# Patient Record
Sex: Female | Born: 1965
Health system: Southern US, Community
[De-identification: ages and names within clinical notes are randomized; demographics above are authoritative.]

## PROBLEM LIST (undated history)

## (undated) ENCOUNTER — Ambulatory Visit: Admission: EM | Source: Home / Self Care

## (undated) DIAGNOSIS — F32A Depression, unspecified: Secondary | ICD-10-CM

## (undated) DIAGNOSIS — G40802 Other epilepsy, not intractable, without status epilepticus: Secondary | ICD-10-CM

## (undated) DIAGNOSIS — K645 Perianal venous thrombosis: Secondary | ICD-10-CM

## (undated) DIAGNOSIS — D689 Coagulation defect, unspecified: Secondary | ICD-10-CM

## (undated) DIAGNOSIS — G40909 Epilepsy, unspecified, not intractable, without status epilepticus: Secondary | ICD-10-CM

## (undated) DIAGNOSIS — R569 Unspecified convulsions: Secondary | ICD-10-CM

## (undated) DIAGNOSIS — Z87440 Personal history of urinary (tract) infections: Secondary | ICD-10-CM

## (undated) DIAGNOSIS — R0600 Dyspnea, unspecified: Secondary | ICD-10-CM

## (undated) DIAGNOSIS — A630 Anogenital (venereal) warts: Secondary | ICD-10-CM

## (undated) DIAGNOSIS — G43909 Migraine, unspecified, not intractable, without status migrainosus: Secondary | ICD-10-CM

## (undated) DIAGNOSIS — D649 Anemia, unspecified: Secondary | ICD-10-CM

## (undated) DIAGNOSIS — F329 Major depressive disorder, single episode, unspecified: Secondary | ICD-10-CM

## (undated) DIAGNOSIS — K529 Noninfective gastroenteritis and colitis, unspecified: Secondary | ICD-10-CM

## (undated) DIAGNOSIS — K635 Polyp of colon: Secondary | ICD-10-CM

## (undated) DIAGNOSIS — T7840XA Allergy, unspecified, initial encounter: Secondary | ICD-10-CM

## (undated) DIAGNOSIS — R0609 Other forms of dyspnea: Secondary | ICD-10-CM

## (undated) HISTORY — PX: COSMETIC SURGERY: SHX468

## (undated) HISTORY — DX: Anogenital (venereal) warts: A63.0

## (undated) HISTORY — DX: Migraine, unspecified, not intractable, without status migrainosus: G43.909

## (undated) HISTORY — DX: Depression, unspecified: F32.A

## (undated) HISTORY — DX: Polyp of colon: K63.5

## (undated) HISTORY — DX: Perianal venous thrombosis: K64.5

## (undated) HISTORY — PX: UPPER GASTROINTESTINAL ENDOSCOPY: SHX188

## (undated) HISTORY — DX: Noninfective gastroenteritis and colitis, unspecified: K52.9

## (undated) HISTORY — DX: Other forms of dyspnea: R06.09

## (undated) HISTORY — DX: Coagulation defect, unspecified: D68.9

## (undated) HISTORY — DX: Major depressive disorder, single episode, unspecified: F32.9

## (undated) HISTORY — PX: HEMORRHOID SURGERY: SHX153

## (undated) HISTORY — DX: Epilepsy, unspecified, not intractable, without status epilepticus: G40.909

## (undated) HISTORY — DX: Unspecified convulsions: R56.9

## (undated) HISTORY — PX: ABDOMINAL HYSTERECTOMY: SHX81

## (undated) HISTORY — DX: Personal history of urinary (tract) infections: Z87.440

## (undated) HISTORY — DX: Allergy, unspecified, initial encounter: T78.40XA

## (undated) HISTORY — DX: Anemia, unspecified: D64.9

## (undated) HISTORY — DX: Other epilepsy, not intractable, without status epilepticus: G40.802

## (undated) HISTORY — DX: Dyspnea, unspecified: R06.00

---
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
## 1998-07-18 ENCOUNTER — Other Ambulatory Visit: Admission: RE | Admit: 1998-07-18 | Discharge: 1998-07-18 | Payer: Self-pay | Admitting: Gynecology

## 2000-04-09 ENCOUNTER — Other Ambulatory Visit: Admission: RE | Admit: 2000-04-09 | Discharge: 2000-04-09 | Payer: Self-pay | Admitting: Obstetrics and Gynecology

## 2000-04-12 ENCOUNTER — Ambulatory Visit (HOSPITAL_COMMUNITY): Admission: RE | Admit: 2000-04-12 | Discharge: 2000-04-12 | Payer: Self-pay | Admitting: *Deleted

## 2000-04-12 ENCOUNTER — Encounter: Payer: Self-pay | Admitting: *Deleted

## 2000-05-16 ENCOUNTER — Ambulatory Visit (HOSPITAL_COMMUNITY): Admission: RE | Admit: 2000-05-16 | Discharge: 2000-05-16 | Payer: Self-pay | Admitting: Obstetrics and Gynecology

## 2000-05-16 ENCOUNTER — Encounter: Payer: Self-pay | Admitting: Obstetrics and Gynecology

## 2000-06-07 ENCOUNTER — Ambulatory Visit (HOSPITAL_COMMUNITY): Admission: RE | Admit: 2000-06-07 | Discharge: 2000-06-07 | Payer: Self-pay | Admitting: Obstetrics and Gynecology

## 2000-08-25 ENCOUNTER — Inpatient Hospital Stay (HOSPITAL_COMMUNITY): Admission: AD | Admit: 2000-08-25 | Discharge: 2000-08-28 | Payer: Self-pay | Admitting: *Deleted

## 2000-08-29 ENCOUNTER — Encounter: Admission: RE | Admit: 2000-08-29 | Discharge: 2000-09-17 | Payer: Self-pay | Admitting: Obstetrics and Gynecology

## 2000-09-19 ENCOUNTER — Encounter: Admission: RE | Admit: 2000-09-19 | Discharge: 2000-12-18 | Payer: Self-pay | Admitting: Obstetrics and Gynecology

## 2001-05-13 ENCOUNTER — Other Ambulatory Visit: Admission: RE | Admit: 2001-05-13 | Discharge: 2001-05-13 | Payer: Self-pay | Admitting: Obstetrics and Gynecology

## 2001-06-11 ENCOUNTER — Encounter (INDEPENDENT_AMBULATORY_CARE_PROVIDER_SITE_OTHER): Payer: Self-pay

## 2001-06-11 ENCOUNTER — Other Ambulatory Visit: Admission: RE | Admit: 2001-06-11 | Discharge: 2001-06-11 | Payer: Self-pay | Admitting: Obstetrics and Gynecology

## 2001-10-03 ENCOUNTER — Other Ambulatory Visit: Admission: RE | Admit: 2001-10-03 | Discharge: 2001-10-03 | Payer: Self-pay | Admitting: Obstetrics and Gynecology

## 2001-12-03 ENCOUNTER — Encounter: Payer: Self-pay | Admitting: Family Medicine

## 2001-12-03 ENCOUNTER — Encounter: Admission: RE | Admit: 2001-12-03 | Discharge: 2001-12-03 | Payer: Self-pay | Admitting: Family Medicine

## 2002-03-04 ENCOUNTER — Other Ambulatory Visit: Admission: RE | Admit: 2002-03-04 | Discharge: 2002-03-04 | Payer: Self-pay | Admitting: Obstetrics and Gynecology

## 2002-06-11 ENCOUNTER — Other Ambulatory Visit: Admission: RE | Admit: 2002-06-11 | Discharge: 2002-06-11 | Payer: Self-pay | Admitting: Obstetrics and Gynecology

## 2002-11-05 ENCOUNTER — Other Ambulatory Visit: Admission: RE | Admit: 2002-11-05 | Discharge: 2002-11-05 | Payer: Self-pay | Admitting: Obstetrics and Gynecology

## 2002-12-16 ENCOUNTER — Ambulatory Visit (HOSPITAL_COMMUNITY): Admission: RE | Admit: 2002-12-16 | Discharge: 2002-12-16 | Payer: Self-pay | Admitting: Obstetrics and Gynecology

## 2002-12-16 ENCOUNTER — Encounter: Payer: Self-pay | Admitting: Obstetrics and Gynecology

## 2003-01-07 ENCOUNTER — Encounter: Payer: Self-pay | Admitting: Obstetrics and Gynecology

## 2003-01-07 ENCOUNTER — Ambulatory Visit (HOSPITAL_COMMUNITY): Admission: RE | Admit: 2003-01-07 | Discharge: 2003-01-07 | Payer: Self-pay | Admitting: Obstetrics and Gynecology

## 2003-04-27 ENCOUNTER — Encounter: Payer: Self-pay | Admitting: Obstetrics and Gynecology

## 2003-04-27 ENCOUNTER — Ambulatory Visit (HOSPITAL_COMMUNITY): Admission: RE | Admit: 2003-04-27 | Discharge: 2003-04-27 | Payer: Self-pay | Admitting: Obstetrics and Gynecology

## 2003-05-14 ENCOUNTER — Inpatient Hospital Stay (HOSPITAL_COMMUNITY): Admission: AD | Admit: 2003-05-14 | Discharge: 2003-05-16 | Payer: Self-pay | Admitting: Obstetrics and Gynecology

## 2004-02-10 ENCOUNTER — Other Ambulatory Visit: Admission: RE | Admit: 2004-02-10 | Discharge: 2004-02-10 | Payer: Self-pay | Admitting: Obstetrics and Gynecology

## 2005-03-30 ENCOUNTER — Other Ambulatory Visit: Admission: RE | Admit: 2005-03-30 | Discharge: 2005-03-30 | Payer: Self-pay | Admitting: Obstetrics and Gynecology

## 2005-10-04 ENCOUNTER — Inpatient Hospital Stay (HOSPITAL_COMMUNITY): Admission: AD | Admit: 2005-10-04 | Discharge: 2005-10-06 | Payer: Self-pay | Admitting: Obstetrics and Gynecology

## 2006-05-02 ENCOUNTER — Other Ambulatory Visit: Admission: RE | Admit: 2006-05-02 | Discharge: 2006-05-02 | Payer: Self-pay | Admitting: Obstetrics and Gynecology

## 2006-12-12 ENCOUNTER — Encounter: Admission: RE | Admit: 2006-12-12 | Discharge: 2006-12-12 | Payer: Self-pay | Admitting: Obstetrics and Gynecology

## 2008-04-26 ENCOUNTER — Encounter: Admission: RE | Admit: 2008-04-26 | Discharge: 2008-04-26 | Payer: Self-pay | Admitting: Obstetrics and Gynecology

## 2009-06-03 ENCOUNTER — Encounter: Admission: RE | Admit: 2009-06-03 | Discharge: 2009-06-03 | Payer: Self-pay | Admitting: Obstetrics and Gynecology

## 2009-12-24 HISTORY — PX: OTHER SURGICAL HISTORY: SHX169

## 2010-07-18 ENCOUNTER — Encounter: Admission: RE | Admit: 2010-07-18 | Discharge: 2010-07-18 | Payer: Self-pay | Admitting: Obstetrics and Gynecology

## 2010-10-29 ENCOUNTER — Emergency Department (HOSPITAL_BASED_OUTPATIENT_CLINIC_OR_DEPARTMENT_OTHER)
Admission: EM | Admit: 2010-10-29 | Discharge: 2010-10-29 | Payer: Self-pay | Source: Home / Self Care | Admitting: Emergency Medicine

## 2011-05-11 NOTE — Discharge Summary (Signed)
Cathy [name], THIEMANN                 ACCOUNT NO.:  [id number]   MEDICAL RECORD NO.:  [id number]          PATIENT TYPE:  INP   LOCATION:  9135                          FACILITY:  [facility]   PHYSICIAN:  Huel Cote, M.D. DATE OF BIRTH:  07/11/1966   DATE OF ADMISSION:  10/04/2005  DATE OF DISCHARGE:  10/06/2005                                 DISCHARGE SUMMARY   DISCHARGE DIAGNOSES:  1.  Term pregnancy at 40 weeks delivered.  2.  Status post normal spontaneous vaginal delivery.   DISCHARGE MEDICATIONS:  1.  Percocet one to two tablets p.o. every 4 hours p.r.n.  2.  Motrin 600 mg p.o. every 6 hours.   DISCHARGE FOLLOWUP:  The patient is to follow up in 6 weeks for her routine  postpartum exam.   HOSPITAL COURSE:  The patient is a 45 year old G4, P2-0-1-2 who was admitted  at [redacted] weeks gestation with complaint of regular contractions and bloody  show. She had not broken her bag of water. On evaluation she was 5 cm on  admission with regular contractions. She shortly ruptured membranes after  admission and received epidural. Prenatal labs were as follows: O positive,  RPR nonreactive, rubella immune, hepatitis B surface antigen negative, HIV  negative, GC and chlamydia negative, triple screen normal, 1-hour glucola  139 and group B strep negative. Prenatal care was complicated by advanced  maternal age with a normal ultrasound.   PAST OBSTETRICAL HISTORY:  She has had two term vaginal deliveries, one  weighing 7 pounds 12 ounces, one weighing 5 pounds 4 ounces. She had one  elective abortion.   PAST MEDICAL HISTORY:  History of reflux disease, remote history of  seizures, depression.   PAST SURGICAL HISTORY:  Tonsillectomy.   ALLERGIES:  NITROFURANTOIN.   PHYSICAL EXAMINATION:  On physical exam on admission she was afebrile with  stable vital signs, fetal heart rate was reassuring, estimated fetal weight  was 7-1/2 pounds. Cervix shortly after receiving epidural was complete,  complete and +2 station. She pushed well, had a normal spontaneous vaginal  delivery of a vigorous female infant, Apgars were 9 and 9, weight was 8 pounds  10 ounces, perineum was intact, estimated blood loss was less than 500  cc and placenta delivered spontaneously. The patient was then admitted for  routine postpartum care and did very well. Postpartum day #2, she was  afebrile with stable vital signs and was felt stable for discharge home.  Therefore, she was discharged home with medications and followup as  previously stated.      Huel Cote, M.D.  Electronically Signed     KR/MEDQ  D:  10/06/2005  T:  10/06/2005  Job:  [id number]

## 2011-05-11 NOTE — Discharge Summary (Signed)
NAMEDEE, PADEN                           ACCOUNT NO.:  [id number]   MEDICAL RECORD NO.:  [id number]                   PATIENT TYPE:  INP   LOCATION:  9141                                 FACILITY:  [facility]   PHYSICIAN:  Huel Cote, M.D.              DATE OF BIRTH:  1966/11/29   DATE OF ADMISSION:  05/14/2003  DATE OF DISCHARGE:  05/16/2003                                 DISCHARGE SUMMARY   DISCHARGE DIAGNOSIS:  1. Term pregnancy at 39 weeks, delivered.  2. Advanced maternal age.  3. Status post normal spontaneous vaginal delivery.   DISCHARGE MEDICATIONS:  1. Motrin 600 mg p.o. q.6h.  2. Percocet 1 to 2 tablets p.o. every 4 hours p.r.n.   FOLLOW UP:  The patient is to follow up in six weeks for her routine  postpartum care.   HISTORY OF PRESENT ILLNESS:  The patient is a 45 year old, G3, P1-0-1-1, who  was admitted at 60 plus weeks for induction of labor given favorable cervix  and term status.  Prenatal care was complicated by advanced maternal age  with a normal triple screen and ultrasound.  She declined amniocentesis.  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 She was IgG positive for  toxoplasmosis antibody screen and followed by Dr. Luciana Axe in ophthalmology  for this problem.   PRENATAL LABORATORY DATA:  O positive, antibody negative, RPR nonreactive,  rubella immune, hepatitis B surface antigen negative, HIV negative, GC  negative, Chlamydia negative, group B strep negative, triple screen normal,  and toxoplasmosis IgG positive.   PAST OBSTETRICAL HISTORY:  In 1993, she had an elective abortion.  In 2001,  she had a normal spontaneous vaginal delivery of a 5 pound 4 ounce infant.   PAST GYNECOLOGIC HISTORY:  Abnormal Pap smears in 1992 and 2002 with atypia  and HPV effect.   PAST SURGICAL HISTORY:  Tonsillectomy.   PAST MEDICAL HISTORY:  1. History of GERD.  2. History of  seizure disorder in childhood; however, no seizures in years.  3. History of migraines and depression.  4. History of toxoplasmosis retinitis, off medications, stable.   ALLERGIES:  NITROFURANTOIN.   MEDICATIONS:  None.   HOSPITAL COURSE:  On admission, she was afebrile with stable vital signs.  Fetal heart rate was reactive.  There was some irritability noted.  She was  gravid and nontender.  Estimated fetal weight was 7-1/2 pounds.  Cervix was  50% effaced, 3 cm, -1 station. She had rupture of membranes performed with  clear fluid obtained and received an epidural shortly thereafter.  The  patient required minimal Pitocin augmentation and reached complete dilation  within several hours.  Pushed well with a normal spontaneous vaginal  delivery of a vigorous female infant over a first degree vaginal laceration.  Apgars were 8/9.  Weight was 7 pounds 12  ounces.  Placenta delivered  spontaneously with a three-vessel cord.  There was a first degree laceration  repaired with one figure-of-eight suture of 2-0 Vicryl.  Estimated blood  loss was 300 ml.   The patient did very well postpartum.  On postpartum day #2, she was  comfortable with p.o. pain medications and doing very well except for some  trouble with breast feeding which she was going to work on with lactation  prior to discharge.  Fundus was firm, and she was discharged home with  medications and followup as previously stated.                                               Huel Cote, M.D.    KR/MEDQ  D:  05/16/2003  T:  05/16/2003  Job:  [id number]

## 2011-05-11 NOTE — Discharge Summary (Signed)
[facility]  Patient:    Cathy [name], Cathy [name]                        MRN: [id number] Adm. Date:  [id number] Disc. Date: [id number] Attending:  Oliver Pila                           Discharge Summary  DISCHARGE DIAGNOSES:          1. Term pregnancy at 39+ weeks.                               2. History of maternal depression.                               3. History of pelvic fracture in mother as a                                  child.                               4. Status post normal spontaneous vaginal                                  delivery.  DISCHARGE MEDICATIONS:        1. Percocet 1-2 tablets p.o. every 4 hours                                  p.r.n.                               2. Motrin 600 mg p.o. every 6 hours.                               3. Celexa 20 mg p.o. daily.  HOSPITAL FOLLOW-UP:           The patient is to follow up in our office in six weeks for her routine postpartum exam.  HOSPITAL COURSE:              The patient is a 44 year old G2, P0-0-1-0, who was admitted at 39-6/7 weeks with contractions every 2-5 minutes.  She had no leakage of fluid and positive bloody show on admission.  Pregnancy had been complicated by a history of depression for which the patient was on Celexa early in pregnancy; however, discontinued by her choice.  The patient had early prenatal care in [place] and transferred to our practice at 19 weeks. Her pregnancy had been complicated by reflux which was treated with Zantac and remote history of seizures of which she had had none since approximately 45 years old. She was also group B strep positive.  PRENATAL LABORATORIES:        O positive, antibody negative, RPR nonreactive, Rubella immune, hepatitis B surface antigen negative, HIV negative, GC negative, chlamydia negative, and group B strep positive.  PAST OBSTETRICAL HISTORY:     She had an elective termination  in 1993.  PAST MEDICAL  HISTORY:         As above.                               1. Depression.                               2. Gastroesophageal reflux disease.                               3. History of pelvic fracture as a child.                               4. History of epilepsy.  PAST SURGICAL HISTORY:        In 1973 she had a tonsillectomy.  PAST GYNECOLOGICAL HISTORY:    She had abnormal Pap smears which resolved.  MEDICATIONS:                  Zantac.  ALLERGIES:                    MACROBID.  PHYSICAL EXAMINATION:  VITAL SIGNS:                  On admission, she was afebrile with stable vital signs.  Fetal heart rate was reactive.  HOSPITAL COURSE:              Cervix was 90% effaced, 4 cm dilated, and -1 station.  She progressed to completely effaced 6 cm and -1 after admission to labor and delivery.  The patient received epidural anesthesia and progressed quickly to complete dilation and pushed well with a normal spontaneous vaginal delivery with vigorous female infant over a first-degree perineal laceration. Apgars were 9 and 9.  Weight was 5 pounds 4 ounces.  The placenta delivered spontaneously with a three-vessel cord.  There was a tight nuchal cord x 1 which reduced over the shoulder for delivery.  First-degree laceration was repaired with a figure-of-eight suture of 2-0 Vicryl for hemostasis.  She did well and was admitted for routine postpartum care.  On postpartum day #2, the patient was voiding well, tolerating a regular diet, and her pain was well controlled.  Therefore, she was discharged to home.  The patient agreed to resume her Celexa after careful discussion and possibility of increased risk of postpartum depression given her history of depression, so she will resume those medications today. DD:  08/28/00 TD:  08/28/00 Job: [id number]

## 2013-10-26 ENCOUNTER — Other Ambulatory Visit: Payer: Self-pay

## 2013-10-26 DIAGNOSIS — Z1231 Encounter for screening mammogram for malignant neoplasm of breast: Secondary | ICD-10-CM

## 2013-11-30 ENCOUNTER — Ambulatory Visit: Payer: Self-pay

## 2014-01-01 ENCOUNTER — Ambulatory Visit
Admission: RE | Admit: 2014-01-01 | Discharge: 2014-01-01 | Disposition: A | Discharge status: Home / Self Care | Payer: Managed Care, Other (non HMO) | Source: Ambulatory Visit

## 2014-01-01 DIAGNOSIS — Z1231 Encounter for screening mammogram for malignant neoplasm of breast: Secondary | ICD-10-CM

## 2014-01-18 ENCOUNTER — Encounter (INDEPENDENT_AMBULATORY_CARE_PROVIDER_SITE_OTHER): Payer: Self-pay | Admitting: General Surgery

## 2014-01-18 ENCOUNTER — Ambulatory Visit (INDEPENDENT_AMBULATORY_CARE_PROVIDER_SITE_OTHER): Payer: Managed Care, Other (non HMO) | Admitting: General Surgery

## 2014-01-18 VITALS — BP 104/68 | HR 68 | Temp 97.5°F | Resp 16 | Ht 62.0 in | Wt 161.2 lb

## 2014-01-18 DIAGNOSIS — K645 Perianal venous thrombosis: Secondary | ICD-10-CM | POA: Insufficient documentation

## 2014-01-18 NOTE — Patient Instructions (Signed)
Bedrest for next 2 days Take colace stool softener twice a day Baby wipes after bm's Warm tub soaks 2-3 times a day Alternate ice packs and tuck's pads as much as you can

## 2014-01-18 NOTE — Progress Notes (Signed)
Patient ID: Cathy [name], female   DOB: 04/26/1966, 48 y.o.   MRN: [id number]  Chief Complaint  Patient presents with  . Rectal Problems    evaluate possible thrombosed hemorrhoids    HPI Cathy [name] is a 48 y.o. female.  We're asked to see the patient in consultation by Dr. Gwen Pounds to evaluate her for a thrombosed hemorrhoid. The patient is a 48 year old white female who has had issues with hemorrhoids since she gave birth to her child. One week ago Sunday she developed a painful swollen area that her rectum. It started bleeding on Wednesday. She has been putting Preparation H and Tucks pads on it. She did have a bowel movement yesterday that was painful. She denies any fevers or chills.  HPI  Past Medical History  Diagnosis Date  . Seizures   . Juvenile epilepsy     Past Surgical History  Procedure Laterality Date  . Tonsillectomy  1973  . Tendon repair Right 2011    right ankle    History reviewed. No pertinent family history.  Social History History  Substance Use Topics  . Smoking status: Never Smoker   . Smokeless tobacco: Not on file  . Alcohol Use: 0.5 oz/week    1 drink(s) per week    Allergies  Allergen Reactions  . Macrobid [Nitrofurantoin Monohyd Macro] Anaphylaxis    No current outpatient prescriptions on file.   No current facility-administered medications for this visit.    Review of Systems Review of Systems  Constitutional: Negative.   HENT: Negative.   Eyes: Negative.   Respiratory: Negative.   Cardiovascular: Negative.   Gastrointestinal: Positive for rectal pain.  Endocrine: Negative.   Genitourinary: Negative.   Musculoskeletal: Negative.   Skin: Negative.   Allergic/Immunologic: Negative.   Neurological: Negative.   Hematological: Negative.   Psychiatric/Behavioral: Negative.     Blood pressure 104/68, pulse 68, temperature 97.5 F (36.4 C), temperature source Temporal, resp. rate 16, height 5\' 2"  (1.575 m), weight 161 lb  3.2 oz (73.12 kg).  Physical Exam Physical Exam  Constitutional: She is oriented to person, place, and time. She appears well-developed and well-nourished.  HENT:  Head: Normocephalic and atraumatic.  Eyes: Conjunctivae and EOM are normal. Pupils are equal, round, and reactive to light.  Neck: Normal range of motion. Neck supple.  Cardiovascular: Normal rate, regular rhythm and normal heart sounds.   Pulmonary/Chest: Effort normal and breath sounds normal.  Abdominal: Soft. Bowel sounds are normal.  Genitourinary:  In the right lateral position there is a thrombosed external hemorrhoid that has a small area that has eroded through the skin where the hematoma is evacuated and. There is no redness or sign of infection.  Musculoskeletal: Normal range of motion.  Lymphadenopathy:    She has no cervical adenopathy.  Neurological: She is alert and oriented to person, place, and time.  Skin: Skin is warm and dry.  Psychiatric: She has a normal mood and affect. Her behavior is normal.    Data Reviewed As above  Assessment    The patient appears to have a thrombosed external hemorrhoid that occurred over a week ago and is now gradually resolving.     Plan    I've recommended some local wound care measures, stool softeners, as well as Tucks pads and ice packs to help reduce the swelling. I suspect this will continue to resolve on its own. We will see her back in one week for a wound check.  TOTH III,Avonna Iribe S 01/18/2014, 3:11 PM

## 2014-01-25 ENCOUNTER — Ambulatory Visit (INDEPENDENT_AMBULATORY_CARE_PROVIDER_SITE_OTHER): Payer: Managed Care, Other (non HMO) | Admitting: General Surgery

## 2014-01-25 ENCOUNTER — Encounter (INDEPENDENT_AMBULATORY_CARE_PROVIDER_SITE_OTHER): Payer: Self-pay | Admitting: General Surgery

## 2014-01-25 VITALS — BP 104/66 | HR 60 | Temp 98.5°F | Resp 14 | Ht 62.0 in | Wt 161.6 lb

## 2014-01-25 DIAGNOSIS — K645 Perianal venous thrombosis: Secondary | ICD-10-CM

## 2014-01-25 NOTE — Progress Notes (Signed)
Subjective:     Patient ID: Reinaldo Raddle, female   DOB: May 05, 1966, 48 y.o.   MRN: [id number]  HPI The patient is a 48 year old white female who we saw last week with a thrombosed external hemorrhoid. She had a small area where the hemorrhoid had expressed itself through the skin. In the last week she has made some progress. She is having less soreness. She is having less bleeding.  Review of Systems     Objective:   Physical Exam On exam her perirectal skin looks good. In the right lateral position the external hemorrhoid that was thrombosed it has gotten significantly smaller. There is no more evidence of clot evacuating. She also has a benign-appearing skin tag anteriorly    Assessment:     The patient has a resolving thrombosed external hemorrhoid     Plan:     At this point I think she should continue to keep the area clean and dry. Can continue to be warm tub soaks once or twice a day. She can continue to use baby wipes after bowel movements. I will plan to see her back in one month to see what she is left with at baseline and see if she would like to have these skin tags removed for Hygiene and comfort

## 2014-01-25 NOTE — Patient Instructions (Signed)
Continue miralax daily

## 2014-02-09 ENCOUNTER — Ambulatory Visit (INDEPENDENT_AMBULATORY_CARE_PROVIDER_SITE_OTHER): Payer: Managed Care, Other (non HMO) | Admitting: General Surgery

## 2014-02-22 ENCOUNTER — Encounter (INDEPENDENT_AMBULATORY_CARE_PROVIDER_SITE_OTHER): Payer: Managed Care, Other (non HMO) | Admitting: General Surgery

## 2014-03-02 ENCOUNTER — Ambulatory Visit (INDEPENDENT_AMBULATORY_CARE_PROVIDER_SITE_OTHER): Payer: Managed Care, Other (non HMO) | Admitting: General Surgery

## 2014-03-02 ENCOUNTER — Encounter (INDEPENDENT_AMBULATORY_CARE_PROVIDER_SITE_OTHER): Payer: Self-pay | Admitting: General Surgery

## 2014-03-02 VITALS — BP 124/74 | HR 76 | Temp 97.8°F | Resp 16 | Ht 62.0 in | Wt 154.0 lb

## 2014-03-02 DIAGNOSIS — K645 Perianal venous thrombosis: Secondary | ICD-10-CM

## 2014-03-02 NOTE — Patient Instructions (Signed)
Call if you want to have external hemorrhoid skin tag removed

## 2014-03-02 NOTE — Progress Notes (Signed)
Subjective:     Patient ID: Cathy [name], female   DOB: 1966-08-08, 48 y.o.   MRN: [id number]  HPI The patient is a 48 year old white female who saw her recently with a thrombosed external hemorrhoid. We treated it with local skin care measures and the thrombosis resolved. She denies any rectal pain now. Her appetite is good and her bowels are working normally. She does state that she has a skin tag that at times causes her to have difficulty getting clean. She has had a couple of urinary tract infections over the last year.  Review of Systems  Constitutional: Negative.   HENT: Negative.   Eyes: Negative.   Respiratory: Negative.   Cardiovascular: Negative.   Gastrointestinal: Negative.   Endocrine: Negative.   Genitourinary: Negative.   Musculoskeletal: Negative.   Skin: Negative.   Allergic/Immunologic: Negative.   Neurological: Negative.   Hematological: Negative.   Psychiatric/Behavioral: Negative.        Objective:   Physical Exam  Constitutional: She is oriented to person, place, and time. She appears well-developed and well-nourished.  HENT:  Head: Normocephalic and atraumatic.  Eyes: Conjunctivae and EOM are normal. Pupils are equal, round, and reactive to light.  Neck: Normal range of motion. Neck supple.  Cardiovascular: Normal rate, regular rhythm and normal heart sounds.   Pulmonary/Chest: Effort normal and breath sounds normal.  Abdominal: Soft. Bowel sounds are normal.  Genitourinary:  She is a moderate sized external hemorrhoidal skin tag that looks benign anterior lead. The rest of her perirectal skin looks good.  Musculoskeletal: Normal range of motion.  Lymphadenopathy:    She has no cervical adenopathy.  Neurological: She is alert and oriented to person, place, and time.  Skin: Skin is warm and dry.  Psychiatric: She has a normal mood and affect. Her behavior is normal.       Assessment:     The patient has recovered from the thrombosed external  hemorrhoid but now has a prominent anterior hemorrhoidal skin tag     Plan:     At this point I have talked to her about the different options for managing this ranging from doing nothing to removing it. She is going to try cleaning with baby wipes to see if this works better and see if she can avoid surgery. She will call and let us know. Otherwise we'll see her back on a when necessary basis.

## 2014-10-12 ENCOUNTER — Ambulatory Visit (INDEPENDENT_AMBULATORY_CARE_PROVIDER_SITE_OTHER): Payer: Managed Care, Other (non HMO) | Admitting: Nurse Practitioner

## 2014-10-12 ENCOUNTER — Encounter: Payer: Self-pay | Admitting: Nurse Practitioner

## 2014-10-12 VITALS — BP 96/64 | HR 74 | Temp 98.4°F | Ht 61.0 in | Wt 147.0 lb

## 2014-10-12 DIAGNOSIS — K5900 Constipation, unspecified: Secondary | ICD-10-CM | POA: Insufficient documentation

## 2014-10-12 DIAGNOSIS — N3001 Acute cystitis with hematuria: Secondary | ICD-10-CM

## 2014-10-12 DIAGNOSIS — N930 Postcoital and contact bleeding: Secondary | ICD-10-CM | POA: Insufficient documentation

## 2014-10-12 DIAGNOSIS — Z0000 Encounter for general adult medical examination without abnormal findings: Secondary | ICD-10-CM

## 2014-10-12 LAB — POCT URINALYSIS DIPSTICK
Bilirubin, UA: NEGATIVE
Glucose, UA: NEGATIVE
Ketones, UA: NEGATIVE
NITRITE UA: POSITIVE
PH UA: 5.5
PROTEIN UA: NEGATIVE
Spec Grav, UA: 1.02
Urobilinogen, UA: 0.2

## 2014-10-12 MED ORDER — SULFAMETHOXAZOLE-TMP DS 800-160 MG PO TABS: 1.0000 | ORAL_TABLET | Freq: Two times a day (BID) | ORAL | Status: DC

## 2014-10-12 MED ORDER — PHENAZOPYRIDINE HCL 200 MG PO TABS: 200.0000 mg | ORAL_TABLET | Freq: Three times a day (TID) | ORAL | Status: DC | PRN

## 2014-10-12 NOTE — Assessment & Plan Note (Addendum)
Increase fiber from whole foods to >30 grams daily. 30 minutes exercise daily. Goal: BM daily.

## 2014-10-12 NOTE — Addendum Note (Signed)
Addended by: Jannette Spanner on: 10/12/2014 11:38 AM   Modules accepted: Orders

## 2014-10-12 NOTE — Patient Instructions (Signed)
I will request records from Dr Marvel Plan, and will contact you if further blood work is recommended. Please continue to see her for bleeding with intercourse.  Please manage constipation with healthy diet. Goal: bowel movement every day. Goal is >30 grams fiber daily. See handout for fiber grams in foods.  In general, eat 5 or more servings fruits & vegetables daily; grains (breads, cereals, rice, pasta) should have 4 grams or more /serving.  For best health, Develop lifelong habits of exercise most days of the week: take a 30 minute walk. The benefits include weight loss, lower risk for heart disease, diabetes, stroke, high blood pressure, lower rates of depression & dementia, better sleep quality & bone health.  You have urinary tract infection. Start antibiotics today.  Start antibiotic. Eat yogurt daily to help prevent antibiotic -associated diarrhea. Our office will call if we need to change the antibiotic. Take pyridium to relax bladder, caution: urine tears & sweat will be orange. Do not be alarmed! Sip hydrating fluids (water, juice, colorless soda, decaff tea) every hour to flush kidneys. Return in 1 month to recheck urine or sooner if symptoms do not improve or you feel worse.  Urinary Tract Infection Urinary tract infections (UTIs) can develop anywhere along your urinary tract. Your urinary tract is your body's drainage system for removing wastes and extra water. Your urinary tract includes two kidneys, two ureters, a bladder, and a urethra. Your kidneys are a pair of bean-shaped organs. Each kidney is about the size of your fist. They are located below your ribs, one on each side of your spine. CAUSES Infections are caused by microbes, which are microscopic organisms, including fungi, viruses, and bacteria. These organisms are so small that they can only be seen through a microscope. Bacteria are the microbes that most commonly cause UTIs. SYMPTOMS  Symptoms of UTIs may vary by age  and gender of the patient and by the location of the infection. Symptoms in young women typically include a frequent and intense urge to urinate and a painful, burning feeling in the bladder or urethra during urination. Older women and men are more likely to be tired, shaky, and weak and have muscle aches and abdominal pain. A fever may mean the infection is in your kidneys. Other symptoms of a kidney infection include pain in your back or sides below the ribs, nausea, and vomiting. DIAGNOSIS To diagnose a UTI, your caregiver will ask you about your symptoms. Your caregiver also will ask to provide a urine sample. The urine sample will be tested for bacteria and white blood cells. White blood cells are made by your body to help fight infection. TREATMENT  Typically, UTIs can be treated with medication. Because most UTIs are caused by a bacterial infection, they usually can be treated with the use of antibiotics. The choice of antibiotic and length of treatment depend on your symptoms and the type of bacteria causing your infection. HOME CARE INSTRUCTIONS  If you were prescribed antibiotics, take them exactly as your caregiver instructs you. Finish the medication even if you feel better after you have only taken some of the medication.  Drink enough water and fluids to keep your urine clear or pale yellow.  Avoid caffeine, tea, and carbonated beverages. They tend to irritate your bladder.  Empty your bladder often. Avoid holding urine for long periods of time.  Empty your bladder before and after sexual intercourse.  After a bowel movement, women should cleanse from front to back. Use each  tissue only once. SEEK MEDICAL CARE IF:   You have back pain.  You develop a fever.  Your symptoms do not begin to resolve within 3 days. SEEK IMMEDIATE MEDICAL CARE IF:   You have severe back pain or lower abdominal pain.  You develop chills.  You have nausea or vomiting.  You have continued  burning or discomfort with urination. MAKE SURE YOU:   Understand these instructions.  Will watch your condition.  Will get help right away if you are not doing well or get worse. Document Released: 09/19/2005 Document Revised: 06/10/2012 Document Reviewed: 01/18/2012 [facility] Patient Information 2014 [place].

## 2014-10-12 NOTE — Progress Notes (Signed)
Pre visit review using our clinic review tool, if applicable. No additional management support is needed unless otherwise documented below in the visit note. 

## 2014-10-12 NOTE — Assessment & Plan Note (Addendum)
Request records from Dr Marvel Plan. BMI goal 25 Encourage daily exercise Diet high in fiber from whole foods. Fiber HO given.

## 2014-10-12 NOTE — Progress Notes (Signed)
Subjective:     Cathy [name] is a 48 y.o. female and is here to establish care. The patient reports problems - bleeding with intercourse for 6 months. She has discussed this with Gynecologist-dr Marvel Plan. Pelvic exam performed 6/15, Pap 6/14-nml. Bleeding worse in last 3 weeks. Dr Marvel Plan plans to perform pelvic US in 2 weeks.  She also c/o suprapubic pain, back pain, & dysuria for last week. She denies nausea, fever. Constipation is sometimes an issue-described as no BM for 1 week. She has had recent weight loss of 40 pounds, intentional.  She reports history of depression related to life events-marriage separation. She took celexa in past and worked with therapist-both helpful.   History   Social History  . Marital Status: Legally Separated    Spouse Name: N/A    Number of Children: 3  . Years of Education: N/A   Occupational History  .     Social History Main Topics  . Smoking status: Never Smoker   . Smokeless tobacco: Never Used  . Alcohol Use: 0.5 oz/week    1 drink(s) per week  . Drug Use: No  . Sexual Activity: Yes    Birth Control/ Protection: Pill   Other Topics Concern  . Not on file   Social History Narrative   Cathy [name] is divorced. She lives with 3 sons. She works FT as an Optometrist.    There are no preventive care reminders to display for this patient.  The following portions of the patient's history were reviewed and updated as appropriate: allergies, current medications, past family history, past medical history, past social history, past surgical history and problem list.  Review of Systems Pertinent items are noted in HPI.   Objective:    BP 96/64  Pulse 74  Temp(Src) 98.4 F (36.9 C) (Temporal)  Ht 5\' 1"  (1.549 m)  Wt 147 lb (66.679 kg)  BMI 27.79 kg/m2  SpO2 100%  LMP 09/23/2014 General appearance: alert, cooperative, appears stated age and no distress Head: Normocephalic, without obvious abnormality, atraumatic Eyes: negative  findings: lids and lashes normal, conjunctivae and sclerae normal, corneas clear and pupils equal, round, reactive to light and accomodation Ears: normal TM's and external ear canals both ears and scar R TM Throat: lips, mucosa, and tongue normal; teeth and gums normal Lungs: clear to auscultation bilaterally Heart: regular rate and rhythm, S1, S2 normal, no murmur, click, rub or gallop Abdomen: normal findings: no masses palpable and no organomegaly and abnormal findings:  tender LLQ Extremities: extremities normal, atraumatic, no cyanosis or edema and varicose veins noted Pulses: 2+ and symmetric Lymph nodes: no cervical or [place] LAD    Assessment:   1. Acute cystitis with hematuria - phenazopyridine (PYRIDIUM) 200 MG tablet; Take 1 tablet (200 mg total) by mouth 3 (three) times daily as needed for pain.  Dispense: 6 tablet; Refill: 0 - sulfamethoxazole-trimethoprim (BACTRIM DS) 800-160 MG per tablet; Take 1 tablet by mouth every 12 (twelve) hours.  Dispense: 6 tablet; Refill: 0  2. Preventative health care  3. PCB (post coital bleeding)  4. Constipation, unspecified constipation type  See problem list for complete A&P See pt instructions. F/u  PRN UTI symptoms

## 2014-10-14 LAB — URINE CULTURE: Colony Count: >=100000

## 2014-10-19 ENCOUNTER — Telehealth: Payer: Self-pay | Admitting: *Deleted

## 2014-10-19 ENCOUNTER — Other Ambulatory Visit: Payer: Self-pay | Admitting: Nurse Practitioner

## 2014-10-19 DIAGNOSIS — N3001 Acute cystitis with hematuria: Secondary | ICD-10-CM

## 2014-10-19 MED ORDER — AMOXICILLIN-POT CLAVULANATE 875-125 MG PO TABS: 1.0000 | ORAL_TABLET | Freq: Two times a day (BID) | ORAL | Status: DC

## 2014-10-19 NOTE — Telephone Encounter (Signed)
Patient called office concerning ov from 10/12/2014. Patient stated that she is still having pain, oder, discharge, and cloudiness. Patient completed antibiotics on Friday 10/23. Patient wanted to know want else can be done to help with symptoms. Please advise?

## 2014-10-20 NOTE — Progress Notes (Signed)
Patient notified

## 2014-11-26 ENCOUNTER — Ambulatory Visit (INDEPENDENT_AMBULATORY_CARE_PROVIDER_SITE_OTHER): Payer: Managed Care, Other (non HMO) | Admitting: Nurse Practitioner

## 2014-11-26 ENCOUNTER — Encounter: Payer: Self-pay | Admitting: Nurse Practitioner

## 2014-11-26 VITALS — BP 117/78 | HR 72 | Temp 97.8°F | Resp 18 | Ht 61.0 in | Wt 146.0 lb

## 2014-11-26 DIAGNOSIS — J020 Streptococcal pharyngitis: Secondary | ICD-10-CM

## 2014-11-26 DIAGNOSIS — J029 Acute pharyngitis, unspecified: Secondary | ICD-10-CM

## 2014-11-26 MED ORDER — AMOXICILLIN 875 MG PO TABS: 875.0000 mg | ORAL_TABLET | Freq: Two times a day (BID) | ORAL | Status: DC

## 2014-11-26 NOTE — Progress Notes (Signed)
Pre visit review using our clinic review tool, if applicable. No additional management support is needed unless otherwise documented below in the visit note. 

## 2014-11-26 NOTE — Progress Notes (Signed)
   Subjective:    Patient ID: Cathy [name], female    DOB: 1966-02-09, 48 y.o.   MRN: [id number]  Sore Throat  This is a new problem. The current episode started in the past 7 days (4 days). The problem has been unchanged. Neither side of throat is experiencing more pain than the other. The maximum temperature recorded prior to her arrival was 101 - 101.9 F. The fever has been present for less than 1 day. The pain is moderate. Associated symptoms include congestion (mild), coughing (infrequent), ear pain, headaches and swollen glands. Pertinent negatives include no abdominal pain, neck pain, shortness of breath or trouble swallowing. She has tried acetaminophen for the symptoms. The treatment provided mild (helps HA) relief.      Review of Systems  Constitutional: Positive for fever. Negative for chills, activity change, appetite change and fatigue.  HENT: Positive for congestion (mild), ear pain and sore throat. Negative for trouble swallowing.   Respiratory: Positive for cough (infrequent). Negative for chest tightness, shortness of breath and wheezing.   Gastrointestinal: Negative for abdominal pain.  Musculoskeletal: Negative for neck pain.  Neurological: Positive for headaches.       Objective:   Physical Exam  Constitutional: She is oriented to person, place, and time. She appears well-developed and well-nourished. No distress.  HENT:  Head: Normocephalic and atraumatic.  Right Ear: External ear normal.  Left Ear: External ear normal.  Mouth/Throat: No oropharyngeal exudate.  Red patches posterior pharynx. Tonsils absent. bilat TM retraction.  Eyes: Conjunctivae are normal. Right eye exhibits no discharge. Left eye exhibits no discharge.  Neck: Normal range of motion. Neck supple. No thyromegaly present.  Cardiovascular: Normal rate, regular rhythm and normal heart sounds.   No murmur heard. Pulmonary/Chest: Effort normal and breath sounds normal. No respiratory distress. She  has no wheezes. She has no rales.  Lymphadenopathy:    She has cervical adenopathy (bilat swollen tonsillar nodes, tender, 1 cm).  Neurological: She is alert and oriented to person, place, and time.  Skin: Skin is warm and dry.  Psychiatric: She has a normal mood and affect. Her behavior is normal. Thought content normal.  Vitals reviewed.         Assessment & Plan:  1. Sore throat - POCT Rapid Strep A-pos  2. Streptococcal sore throat - amoxicillin (AMOXIL) 875 MG tablet; Take 1 tablet (875 mg total) by mouth 2 (two) times daily.  Dispense: 20 tablet; Refill: 0 See patient instructions for complete plan.

## 2014-11-26 NOTE — Patient Instructions (Addendum)
Start antibiotic.Eat yogurt daily to help prevent antibiotic -associated diarrhea.  Gargle with salt water several times daily (1/4 tsp salt mixed with 1/4 cup warm water).  Listerene gargles twice daily.  Change tooth brush in 24 hours of starting antibiotic.  Benzocaine throat lozenges will help with discomfort.  Strep Throat Strep throat is an infection of the throat caused by a bacteria named Streptococcus pyogenes. Your caregiver may call the infection streptococcal "tonsillitis" or "pharyngitis" depending on whether there are signs of inflammation in the tonsils or back of the throat. Strep throat is most common in children aged 5 15 years during the cold months of the year, but it can occur in people of any age during any season. This infection is spread from person to person (contagious) through coughing, sneezing, or other close contact. SYMPTOMS   Fever or chills.  Painful, swollen, red tonsils or throat.  Pain or difficulty when swallowing.  White or yellow spots on the tonsils or throat.  Swollen, tender lymph nodes or "glands" of the neck or under the jaw.  Red rash all over the body (rare). DIAGNOSIS  Many different infections can cause the same symptoms. A test must be done to confirm the diagnosis so the right treatment can be given. A "rapid strep test" can help your caregiver make the diagnosis in a few minutes. If this test is not available, a light swab of the infected area can be used for a throat culture test. If a throat culture test is done, results are usually available in a day or two. TREATMENT  Strep throat is treated with antibiotic medicine. HOME CARE INSTRUCTIONS   Gargle with 1 tsp of salt in 1 cup of warm water, 3 4 times per day or as needed for comfort.  Family members who also have a sore throat or fever should be tested for strep throat and treated with antibiotics if they have the strep infection.  Make sure everyone in your household washes their  hands well.  Do not share food, drinking cups, or personal items that could cause the infection to spread to others.  You may need to eat a soft food diet until your sore throat gets better.  Drink enough water and fluids to keep your urine clear or pale yellow. This will help prevent dehydration.  Get plenty of rest.  Stay home from school, daycare, or work until you have been on antibiotics for 24 hours.  Only take over-the-counter or prescription medicines for pain, discomfort, or fever as directed by your caregiver.  If antibiotics are prescribed, take them as directed. Finish them even if you start to feel better. SEEK MEDICAL CARE IF:   The glands in your neck continue to enlarge.  You develop a rash, cough, or earache.  You cough up green, yellow-brown, or bloody sputum.  You have pain or discomfort not controlled by medicines.  Your problems seem to be getting worse rather than better. SEEK IMMEDIATE MEDICAL CARE IF:   You develop any new symptoms such as vomiting, severe headache, stiff or painful neck, chest pain, shortness of breath, or trouble swallowing.  You develop severe throat pain, drooling, or changes in your voice.  You develop swelling of the neck, or the skin on the neck becomes red and tender.  You have a fever.  You develop signs of dehydration, such as fatigue, dry mouth, and decreased urination.  You become increasingly sleepy, or you cannot wake up completely. Document Released: 12/07/2000 Document Revised:   ExitCare Patient Information 2014 [place].

## 2014-12-13 LAB — HM PAP SMEAR: HM Pap smear: ABNORMAL

## 2014-12-14 ENCOUNTER — Encounter (HOSPITAL_COMMUNITY)
Admission: RE | Admit: 2014-12-14 | Discharge: 2014-12-14 | Disposition: A | Discharge status: Home / Self Care | Payer: Managed Care, Other (non HMO) | Source: Ambulatory Visit | Attending: Obstetrics and Gynecology | Admitting: Obstetrics and Gynecology

## 2014-12-14 ENCOUNTER — Encounter (HOSPITAL_COMMUNITY): Payer: Self-pay

## 2014-12-14 DIAGNOSIS — Z01812 Encounter for preprocedural laboratory examination: Secondary | ICD-10-CM | POA: Insufficient documentation

## 2014-12-14 LAB — BASIC METABOLIC PANEL
Anion gap: 6 (ref 5–15)
BUN: 9 mg/dL (ref 6–23)
CALCIUM: 9.5 mg/dL (ref 8.4–10.5)
CHLORIDE: 107 meq/L (ref 96–112)
CO2: 26 mmol/L (ref 19–32)
CREATININE: 0.67 mg/dL (ref 0.50–1.10)
GFR calc Af Amer: >90 mL/min (ref >90)
GFR calc non Af Amer: >90 mL/min (ref >90)
GLUCOSE: 109 mg/dL — AB (ref 70–99)
Potassium: 4.2 mmol/L (ref 3.5–5.1)
Sodium: 139 mmol/L (ref 135–145)

## 2014-12-14 LAB — CBC
HEMATOCRIT: 37 % (ref 36.0–46.0)
HEMOGLOBIN: 12.9 g/dL (ref 12.0–15.0)
MCH: 32.7 pg (ref 26.0–34.0)
MCHC: 34.9 g/dL (ref 30.0–36.0)
MCV: 93.7 fL (ref 78.0–100.0)
Platelets: 238 10*3/uL (ref 150–400)
RBC: 3.95 MIL/uL (ref 3.87–5.11)
RDW: 13.2 % (ref 11.5–15.5)
WBC: 5.9 10*3/uL (ref 4.0–10.5)

## 2014-12-14 LAB — TYPE AND SCREEN
ABO/RH(D): O POS
ANTIBODY SCREEN: NEGATIVE

## 2014-12-14 LAB — ABO/RH: ABO/RH(D): O POS

## 2014-12-14 NOTE — Patient Instructions (Addendum)
Your procedure is scheduled on:12/20/14  Enter through the Main Entrance at : [facility] up desk phone and dial [phone] and inform us of your arrival.  Please call [phone] if you have any problems the morning of surgery.  Remember: Do not eat food or drink liquids, including water, after midnight:Sunday 12/19/14   You may brush your teeth the morning of surgery.   DO NOT wear jewelry, eye make-up, lipstick,body lotion, or dark fingernail polish.  (Polished toes are ok) You may wear deodorant.  If you are to be admitted after surgery, leave suitcase in car until your room has been assigned. Patients discharged on the day of surgery will not be allowed to drive home. Wear loose fitting, comfortable clothes for your ride home.

## 2014-12-18 NOTE — H&P (Signed)
Cathy [name] is an 48 y.o. female G4P3 who presents for scheduled LAVH/bilateral salpingectomy. She has had  abnormal bleeding and pain of months duration.  She has developed increasingly irregular menstrual cycles and now also heavy postcoital bleeding that lasts 24 hours following intercourse. Has some pain and cramping with flow but also other times.  d/c OCP's two months ago because could not remember to take them regularly and was emotional on them. Being off of them has actually made bleeding worse, but she does not tolerate the side effects.  She is ready to proceed with definitive therapy for both the abnormal bleeding and pain.    Pertinent Gynecological History:  OB History: NSVD x 3   Menstrual History:  No LMP recorded.    Past Medical History  Diagnosis Date  . Juvenile epilepsy   . Depression   . Frequent headaches   . Migraines   . History of frequent urinary tract infections   . Seizures     none since 1987- classified as epileptic seizures    Past Surgical History  Procedure Laterality Date  . Tonsillectomy  1973  . Tendon repair Right 2011    right ankle  . Hemorrhoid surgery      thrombosed hemorrhoid    Family History  Problem Relation Age of Onset  . Hyperlipidemia Mother   . Heart disease Mother   . Hypertension Mother   . Cancer Mother     cervical  . Depression Sister   . Asthma Sister   . Asthma Son   . Dementia Maternal Grandmother     Social History:  reports that she has never smoked. She has never used smokeless tobacco. She reports that she drinks about 0.5 oz of alcohol per week. She reports that she does not use illicit drugs.  Allergies:  Allergies  Allergen Reactions  . Macrobid [Nitrofurantoin Monohyd Macro] Anaphylaxis    No prescriptions prior to admission    ROS  There were no vitals taken for this visit. Physical Exam  Constitutional: She appears well-developed and well-nourished.  Cardiovascular: Normal rate and  regular rhythm.   Respiratory: Effort normal.  GI: Soft.  Genitourinary: Vagina normal and uterus normal.  Normal size uterus with good descensus  Musculoskeletal: Normal range of motion.  Neurological: She is alert.  Psychiatric: She has a normal mood and affect.    No results found for this or any previous visit (from the past 24 hour(s)).  No results found.  Assessment/Plan: The patient was counseled regarding the risks of laparoscopic assisted vaginal hysterectomy, The procedure was reviewed in detail and expectations regarding recovery. Risks of bleeding, infection and possible damage to bowel and bladder were reviewed. The patient understands that should a complication arise she would likely need a larger abdominal incision and that this would delay her recovery. She would accept a blood transfusion if needed. We also discussed removal of the fallopian tubes as a means of possibly reducing future risk of ovarian cancer and she is agreeable to this. With her h/o pelvic pain, we will fulgarate endometriosis if encountered.  She will retain her ovaries unless there is obvious pathology that would warrant removal. She is ready to proceed.   Logan Bores 12/18/2014, 4:09 PM

## 2014-12-20 ENCOUNTER — Ambulatory Visit (HOSPITAL_COMMUNITY): Payer: Managed Care, Other (non HMO) | Admitting: Certified Registered Nurse Anesthetist

## 2014-12-20 ENCOUNTER — Ambulatory Visit (HOSPITAL_COMMUNITY)
Admission: RE | Admit: 2014-12-20 | Discharge: 2014-12-21 | Disposition: A | Discharge status: Home / Self Care | Payer: Managed Care, Other (non HMO) | Source: Ambulatory Visit | Attending: Obstetrics and Gynecology | Admitting: Obstetrics and Gynecology

## 2014-12-20 ENCOUNTER — Encounter (HOSPITAL_COMMUNITY): Payer: Self-pay | Admitting: *Deleted

## 2014-12-20 ENCOUNTER — Encounter (HOSPITAL_COMMUNITY)
Admission: RE | Disposition: A | Discharge status: Home / Self Care | Payer: Self-pay | Source: Ambulatory Visit | Attending: Obstetrics and Gynecology

## 2014-12-20 DIAGNOSIS — D259 Leiomyoma of uterus, unspecified: Secondary | ICD-10-CM | POA: Insufficient documentation

## 2014-12-20 DIAGNOSIS — R102 Pelvic and perineal pain: Secondary | ICD-10-CM | POA: Diagnosis present

## 2014-12-20 DIAGNOSIS — Z90710 Acquired absence of both cervix and uterus: Secondary | ICD-10-CM | POA: Diagnosis present

## 2014-12-20 DIAGNOSIS — G43909 Migraine, unspecified, not intractable, without status migrainosus: Secondary | ICD-10-CM | POA: Diagnosis not present

## 2014-12-20 DIAGNOSIS — Z8049 Family history of malignant neoplasm of other genital organs: Secondary | ICD-10-CM | POA: Diagnosis not present

## 2014-12-20 DIAGNOSIS — N920 Excessive and frequent menstruation with regular cycle: Secondary | ICD-10-CM | POA: Diagnosis present

## 2014-12-20 DIAGNOSIS — F329 Major depressive disorder, single episode, unspecified: Secondary | ICD-10-CM | POA: Insufficient documentation

## 2014-12-20 HISTORY — PX: BILATERAL SALPINGECTOMY: SHX5743

## 2014-12-20 HISTORY — PX: LAPAROSCOPIC ASSISTED VAGINAL HYSTERECTOMY: SHX5398

## 2014-12-20 LAB — PREGNANCY, URINE: Preg Test, Ur: NEGATIVE

## 2014-12-20 LAB — HM PAP SMEAR
HPV, high-risk: POSITIVE
Pap: NEGATIVE

## 2014-12-20 SURGERY — HYSTERECTOMY, VAGINAL, LAPAROSCOPY-ASSISTED
Anesthesia: General | Site: Abdomen

## 2014-12-20 MED ORDER — PROPOFOL 10 MG/ML IV BOLUS
INTRAVENOUS | Status: DC | PRN
  Administered 2014-12-20: 150 mg via INTRAVENOUS

## 2014-12-20 MED ORDER — DEXAMETHASONE SODIUM PHOSPHATE 10 MG/ML IJ SOLN
INTRAMUSCULAR | Status: AC
  Filled 2014-12-20: qty 1

## 2014-12-20 MED ORDER — VASOPRESSIN 20 UNIT/ML IV SOLN
INTRAVENOUS | Status: AC
  Filled 2014-12-20: qty 1

## 2014-12-20 MED ORDER — CEFAZOLIN SODIUM-DEXTROSE 2-3 GM-% IV SOLR
INTRAVENOUS | Status: AC
  Filled 2014-12-20: qty 50

## 2014-12-20 MED ORDER — GLYCOPYRROLATE 0.2 MG/ML IJ SOLN
INTRAMUSCULAR | Status: DC | PRN
  Administered 2014-12-20 (×2): 0.1 mg via INTRAVENOUS
  Administered 2014-12-20: 0.4 mg via INTRAVENOUS

## 2014-12-20 MED ORDER — MEPERIDINE HCL 25 MG/ML IJ SOLN: 6.2500 mg | INTRAMUSCULAR | Status: DC | PRN

## 2014-12-20 MED ORDER — PROMETHAZINE HCL 25 MG/ML IJ SOLN: 6.2500 mg | INTRAMUSCULAR | Status: DC | PRN

## 2014-12-20 MED ORDER — ROCURONIUM BROMIDE 100 MG/10ML IV SOLN
INTRAVENOUS | Status: AC
  Filled 2014-12-20: qty 1

## 2014-12-20 MED ORDER — DEXAMETHASONE SODIUM PHOSPHATE 10 MG/ML IJ SOLN
INTRAMUSCULAR | Status: DC | PRN
  Administered 2014-12-20: 5 mg via INTRAVENOUS

## 2014-12-20 MED ORDER — LACTATED RINGERS IV SOLN
INTRAVENOUS | Status: DC
  Administered 2014-12-20 (×4): via INTRAVENOUS

## 2014-12-20 MED ORDER — PROPOFOL 10 MG/ML IV EMUL
INTRAVENOUS | Status: AC
  Filled 2014-12-20: qty 20

## 2014-12-20 MED ORDER — DIPHENHYDRAMINE HCL 12.5 MG/5ML PO ELIX: 12.5000 mg | ORAL_SOLUTION | Freq: Four times a day (QID) | ORAL | Status: DC | PRN

## 2014-12-20 MED ORDER — ROCURONIUM BROMIDE 100 MG/10ML IV SOLN
INTRAVENOUS | Status: DC | PRN
  Administered 2014-12-20: 30 mg via INTRAVENOUS
  Administered 2014-12-20: 20 mg via INTRAVENOUS
  Administered 2014-12-20: 10 mg via INTRAVENOUS

## 2014-12-20 MED ORDER — SODIUM CHLORIDE 0.9 % IJ SOLN
9.0000 mL | INTRAMUSCULAR | Status: DC | PRN
Start: 2014-12-20 — End: 2014-12-21

## 2014-12-20 MED ORDER — HYDROMORPHONE HCL 1 MG/ML IJ SOLN: 0.2500 mg | INTRAMUSCULAR | Status: DC | PRN

## 2014-12-20 MED ORDER — HEPARIN SODIUM (PORCINE) 5000 UNIT/ML IJ SOLN
INTRAMUSCULAR | Status: AC
  Filled 2014-12-20: qty 1

## 2014-12-20 MED ORDER — LIDOCAINE HCL (CARDIAC) 20 MG/ML IV SOLN
INTRAVENOUS | Status: DC | PRN
  Administered 2014-12-20: 60 mg via INTRAVENOUS

## 2014-12-20 MED ORDER — SCOPOLAMINE 1 MG/3DAYS TD PT72
MEDICATED_PATCH | TRANSDERMAL | Status: AC
  Administered 2014-12-20: 1.5 mg via TRANSDERMAL
  Filled 2014-12-20: qty 1

## 2014-12-20 MED ORDER — KETOROLAC TROMETHAMINE 30 MG/ML IJ SOLN
INTRAMUSCULAR | Status: DC | PRN
  Administered 2014-12-20: 30 mg via INTRAVENOUS

## 2014-12-20 MED ORDER — GLYCOPYRROLATE 0.2 MG/ML IJ SOLN
INTRAMUSCULAR | Status: AC
  Filled 2014-12-20: qty 3

## 2014-12-20 MED ORDER — SODIUM CHLORIDE 0.9 % IJ SOLN
INTRAMUSCULAR | Status: AC
Start: 2014-12-20 — End: 2014-12-20
  Filled 2014-12-20: qty 100

## 2014-12-20 MED ORDER — ACETAMINOPHEN 160 MG/5ML PO SOLN: 325.0000 mg | ORAL | Status: DC | PRN

## 2014-12-20 MED ORDER — NEOSTIGMINE METHYLSULFATE 10 MG/10ML IV SOLN
INTRAVENOUS | Status: AC
  Filled 2014-12-20: qty 1

## 2014-12-20 MED ORDER — CEFAZOLIN SODIUM-DEXTROSE 2-3 GM-% IV SOLR
2.0000 g | INTRAVENOUS | Status: AC
  Administered 2014-12-20: 2 g via INTRAVENOUS

## 2014-12-20 MED ORDER — 0.9 % SODIUM CHLORIDE (POUR BTL) OPTIME
TOPICAL | Status: DC | PRN
  Administered 2014-12-20: 1000 mL

## 2014-12-20 MED ORDER — HYDROMORPHONE HCL 1 MG/ML IJ SOLN
INTRAMUSCULAR | Status: AC
  Filled 2014-12-20: qty 1

## 2014-12-20 MED ORDER — MIDAZOLAM HCL 2 MG/2ML IJ SOLN
INTRAMUSCULAR | Status: AC
Start: 2014-12-20 — End: 2014-12-20
  Filled 2014-12-20: qty 2

## 2014-12-20 MED ORDER — LACTATED RINGERS IV SOLN: INTRAVENOUS | Status: DC

## 2014-12-20 MED ORDER — LIDOCAINE HCL (CARDIAC) 20 MG/ML IV SOLN
INTRAVENOUS | Status: AC
  Filled 2014-12-20: qty 5

## 2014-12-20 MED ORDER — NEOSTIGMINE METHYLSULFATE 10 MG/10ML IV SOLN
INTRAVENOUS | Status: DC | PRN
Start: 2014-12-20 — End: 2014-12-20
  Administered 2014-12-20: 3 mg via INTRAVENOUS

## 2014-12-20 MED ORDER — DIPHENHYDRAMINE HCL 50 MG/ML IJ SOLN: 12.5000 mg | Freq: Four times a day (QID) | INTRAMUSCULAR | Status: DC | PRN

## 2014-12-20 MED ORDER — MIDAZOLAM HCL 2 MG/2ML IJ SOLN: 0.5000 mg | Freq: Once | INTRAMUSCULAR | Status: DC | PRN

## 2014-12-20 MED ORDER — BUPIVACAINE HCL (PF) 0.25 % IJ SOLN
INTRAMUSCULAR | Status: AC
  Filled 2014-12-20: qty 10

## 2014-12-20 MED ORDER — HYDROMORPHONE HCL 1 MG/ML IJ SOLN
INTRAMUSCULAR | Status: DC | PRN
  Administered 2014-12-20: 1 mg via INTRAVENOUS
  Administered 2014-12-20 (×2): .5 mg via INTRAVENOUS

## 2014-12-20 MED ORDER — MIDAZOLAM HCL 2 MG/2ML IJ SOLN
INTRAMUSCULAR | Status: DC | PRN
  Administered 2014-12-20: 1.5 mg via INTRAVENOUS
  Administered 2014-12-20: 0.5 mg via INTRAVENOUS

## 2014-12-20 MED ORDER — ONDANSETRON HCL 4 MG/2ML IJ SOLN: 4.0000 mg | Freq: Four times a day (QID) | INTRAMUSCULAR | Status: DC | PRN

## 2014-12-20 MED ORDER — ONDANSETRON HCL 4 MG/2ML IJ SOLN
INTRAMUSCULAR | Status: AC
  Filled 2014-12-20: qty 2

## 2014-12-20 MED ORDER — HYDROMORPHONE 0.3 MG/ML IV SOLN
INTRAVENOUS | Status: DC
  Administered 2014-12-20: 15:00:00 via INTRAVENOUS
  Administered 2014-12-20: 0.399 mg via INTRAVENOUS
  Administered 2014-12-20: 0.4 mg via INTRAVENOUS
  Administered 2014-12-21: 0.2 mg via INTRAVENOUS
  Administered 2014-12-21: 1.19 mg via INTRAVENOUS
  Filled 2014-12-20: qty 25

## 2014-12-20 MED ORDER — SCOPOLAMINE 1 MG/3DAYS TD PT72
1.0000 | MEDICATED_PATCH | Freq: Once | TRANSDERMAL | Status: AC
  Administered 2014-12-20: 1 via TRANSDERMAL
  Administered 2014-12-20: 1.5 mg via TRANSDERMAL

## 2014-12-20 MED ORDER — LACTATED RINGERS IR SOLN
Status: DC | PRN
  Administered 2014-12-20: 3000 mL

## 2014-12-20 MED ORDER — FENTANYL CITRATE 0.05 MG/ML IJ SOLN
INTRAMUSCULAR | Status: DC | PRN
  Administered 2014-12-20 (×3): 50 ug via INTRAVENOUS
  Administered 2014-12-20: 100 ug via INTRAVENOUS

## 2014-12-20 MED ORDER — ONDANSETRON HCL 4 MG/2ML IJ SOLN
INTRAMUSCULAR | Status: DC | PRN
  Administered 2014-12-20 (×2): 2 mg via INTRAVENOUS

## 2014-12-20 MED ORDER — ACETAMINOPHEN 325 MG PO TABS: 325.0000 mg | ORAL_TABLET | ORAL | Status: DC | PRN

## 2014-12-20 MED ORDER — KETOROLAC TROMETHAMINE 30 MG/ML IJ SOLN: 30.0000 mg | Freq: Once | INTRAMUSCULAR | Status: DC | PRN

## 2014-12-20 MED ORDER — NALOXONE HCL 0.4 MG/ML IJ SOLN
0.4000 mg | INTRAMUSCULAR | Status: DC | PRN
Start: 2014-12-20 — End: 2014-12-21

## 2014-12-20 MED ORDER — FENTANYL CITRATE 0.05 MG/ML IJ SOLN
INTRAMUSCULAR | Status: AC
  Filled 2014-12-20: qty 5

## 2014-12-20 MED ORDER — BUPIVACAINE HCL (PF) 0.25 % IJ SOLN
INTRAMUSCULAR | Status: DC | PRN
  Administered 2014-12-20: 10 mL

## 2014-12-20 MED ORDER — VASOPRESSIN 20 UNIT/ML IV SOLN
INTRAVENOUS | Status: DC | PRN
  Administered 2014-12-20: 10 mL via INTRAMUSCULAR

## 2014-12-20 MED ORDER — LACTATED RINGERS IV SOLN
INTRAVENOUS | Status: DC
  Administered 2014-12-20 – 2014-12-21 (×2): via INTRAVENOUS

## 2014-12-20 MED ORDER — KETOROLAC TROMETHAMINE 30 MG/ML IJ SOLN
INTRAMUSCULAR | Status: AC
  Filled 2014-12-20: qty 1

## 2014-12-20 SURGICAL SUPPLY — 52 items
BLADE SURG 10 STRL SS (BLADE) ×4 IMPLANT
BLADE SURG 11 STRL SS (BLADE) ×8 IMPLANT
CABLE HIGH FREQUENCY MONO STRZ (ELECTRODE) IMPLANT
CATH ROBINSON RED A/P 16FR (CATHETERS) IMPLANT
CLOSURE WOUND 1/4 X3 (GAUZE/BANDAGES/DRESSINGS)
CLOTH BEACON ORANGE TIMEOUT ST (SAFETY) ×4 IMPLANT
CONT PATH 16OZ SNAP LID 3702 (MISCELLANEOUS) ×4 IMPLANT
COVER BACK TABLE 60X90IN (DRAPES) ×4 IMPLANT
COVER LIGHT HANDLE  1/PK (MISCELLANEOUS) ×4
COVER LIGHT HANDLE 1/PK (MISCELLANEOUS) IMPLANT
COVER MAYO STAND STRL (DRAPES) IMPLANT
DECANTER SPIKE VIAL GLASS SM (MISCELLANEOUS) ×4 IMPLANT
DRSG COVADERM PLUS 2X2 (GAUZE/BANDAGES/DRESSINGS) ×4 IMPLANT
DRSG OPSITE POSTOP 3X4 (GAUZE/BANDAGES/DRESSINGS) IMPLANT
DURAPREP 26ML APPLICATOR (WOUND CARE) ×4 IMPLANT
ELECT REM PT RETURN 9FT ADLT (ELECTROSURGICAL)
ELECTRODE REM PT RTRN 9FT ADLT (ELECTROSURGICAL) IMPLANT
FILTER SMOKE EVAC LAPAROSHD (FILTER) IMPLANT
GLOVE BIO SURGEON STRL SZ 6.5 (GLOVE) ×11 IMPLANT
GLOVE BIO SURGEONS STRL SZ 6.5 (GLOVE) ×5
GLOVE BIOGEL PI IND STRL 6.5 (GLOVE) ×2 IMPLANT
GLOVE BIOGEL PI IND STRL 7.0 (GLOVE) ×4 IMPLANT
GLOVE BIOGEL PI INDICATOR 6.5 (GLOVE) ×2
GLOVE BIOGEL PI INDICATOR 7.0 (GLOVE) ×6
GLOVE SURG SS PI 7.0 STRL IVOR (GLOVE) ×2 IMPLANT
GOWN STRL REUS W/ TWL LRG LVL3 (GOWN DISPOSABLE) ×14 IMPLANT
GOWN STRL REUS W/TWL LRG LVL3 (GOWN DISPOSABLE) ×28
LIQUID BAND (GAUZE/BANDAGES/DRESSINGS) ×2 IMPLANT
NEEDLE INSUFFLATION 120MM (ENDOMECHANICALS) ×4 IMPLANT
NS IRRIG 1000ML POUR BTL (IV SOLUTION) ×4 IMPLANT
PACK LAVH (CUSTOM PROCEDURE TRAY) ×4 IMPLANT
PAD TRENDELENBURG OR TABLE (MISCELLANEOUS) ×4 IMPLANT
PROTECTOR NERVE ULNAR (MISCELLANEOUS) ×4 IMPLANT
SET IRRIG TUBING LAPAROSCOPIC (IRRIGATION / IRRIGATOR) ×4 IMPLANT
SET TRI-LUMEN FLTR TB AIRSEAL (TUBING) ×2 IMPLANT
SHEARS HARMONIC ACE PLUS 36CM (ENDOMECHANICALS) ×4 IMPLANT
SLEEVE XCEL OPT CAN 5 100 (ENDOMECHANICALS) ×8 IMPLANT
STRIP CLOSURE SKIN 1/4X3 (GAUZE/BANDAGES/DRESSINGS) IMPLANT
SUT SILK 0 FSL (SUTURE) ×4 IMPLANT
SUT VIC AB 0 CT1 18XCR BRD8 (SUTURE) ×4 IMPLANT
SUT VIC AB 0 CT1 8-18 (SUTURE) ×8
SUT VIC AB 2-0 CT1 (SUTURE) ×6 IMPLANT
SUT VICRYL 0 TIES 12 18 (SUTURE) IMPLANT
SUT VICRYL 0 UR6 27IN ABS (SUTURE) IMPLANT
SUT VICRYL 4-0 PS2 18IN ABS (SUTURE) ×4 IMPLANT
SYR TB 1ML 25GX5/8 (SYRINGE) ×2 IMPLANT
TOWEL OR 17X24 6PK STRL BLUE (TOWEL DISPOSABLE) ×8 IMPLANT
TRAY FOLEY CATH 14FR (SET/KITS/TRAYS/PACK) ×4 IMPLANT
TROCAR PORT AIRSEAL 5X120 (TROCAR) ×2 IMPLANT
TROCAR XCEL NON-BLD 5MMX100MML (ENDOMECHANICALS) ×4 IMPLANT
WARMER LAPAROSCOPE (MISCELLANEOUS) ×4 IMPLANT
WATER STERILE IRR 1000ML POUR (IV SOLUTION) ×4 IMPLANT

## 2014-12-20 NOTE — Op Note (Signed)
Operative Note  Preoperative diagnosis Pelvic Pain Abnormal uterine bleeding Menorrhagia  Postoperative diagnosis Same  Procedure LAVH/bilateral salpingectomy  Surgeon Dr. Paula Compton Dr.Jody Bovard  Anesthesia General  Fluids EBL 250cc UOP 100cc clear IV fluids 1800cc  Findings  No obvious endometriosis or pelvic pathology. Ovaries and tubes appeared normal. The liver edge appeared normal. The uterus was slightly boggy in appearance but otherwise normal.  Specimen Uterus cervix and bilateral fallopian tubes sent to pathology  Procedure note Patient was taken to the operating room where general anesthesia was obtained without difficulty. She was then prepped and draped in the normal sterile fashion in the dorsal lithotomy position. An appropriate time out was performed. A speculum was placed the vagina and a Hulka tenaculum introduced into the uterus for uterine manipulation. Attention was then turned to the abdomen after all were gown and gloved and the infraumbilical area injected with quarter percent Marcaine. An infra umbilical incision was made in the varies needle introduced into the peritoneal cavity. Aspiration and injection with normal saline confirmed placement. The gas flow was then applied and pneumoperitoneum obtained with approximate 3 L of CO2 gas. The 5 mm direct entry trocar was then utilized to visualize intrigue and intraperitoneal placement confirmed. With this trocar in place 2 additional trochars were placed one the an additional 5 mm port on the left side and on the right the 5 mm insufflator port was placed. Each port site was injected with quarter percent Marcaine prior placement. With all port sites in place the tubes were grasped and reflected medially and the harmonic scalpel utilized to separate the fallopian tube from the mesosalpinx and ovary. Once these were freed the remainder of the utero-ovarian ligament broad ligament and round ligament were  taken down with harmonic scalpel bilaterally. The bladder flap was created with the harmonic scalpel as well. With no significant active bleeding noted after completion of this the instruments were removed from the abdomen and covered with a sterile drape as well as the ports. Attention was then turned vaginally. The Hulka tenaculum was removed and a weighted speculum placed within the vagina as well as a Deaver retractor to isolate the cervix. Cervix was grasped on anterior posterior lip with Yates Decamp tenaculums 2. A dilute solution of Pitressin was then injected circumferentially around the cervix just under the mucosa. The Bovie cautery was used to make a circumferential incision and the cervical mucosa and the Mayo scissors then utilized to further dissect the mucosa off the underlying cervix. The anterior cul-de-sac was entered sharply and a Deaver retractor placed within it. Similarly the posterior cul-de-sac was entered sharply and a banana speculum placed within it. Thus with the rectum and the bladder isolated from the uterus the uterosacral ligaments were grasped in Zeppelin clamps bilaterally transected and suture ligated with 0 Vicryl. These were held in hemostats. The remainder of the broad ligament and uterine arteries were then grasped was up with clamps transected and suture ligated with 0 Vicryl. The uterus was then flipped and delivered from the vagina with minimal tissue still holding it to the lateral sidewalls this was taken down with Zeppelin clamp bilaterally and suture ligated with 0 Vicryl as well. Once the uterus was completely detached and was handed off to pathology and the vaginal cuff and angles inspected. There was some bleeding at the right ovary and cuff which was controlled with several figure-of-eight sutures of 0 Vicryl. The posterior cuff was then run with 2-0 Vicryl in a running fashion. Once  this was completed good hemostasis was observed.  The uterosacral ligaments were then  reapproximated with 0 Vicryl. The vaginal cuff was then closed with 2-0 Vicryl in a running locked suture. At the conclusion the cuff appeared hemostatic and all instruments and sponges were removed from the vagina. Down's and gloves were changed and attention was returned to the abdomen. The pneumoperitoneum was once again obtained and the scope introduced into the abdomen. All areas were inspected and appeared hemostatic. Attempts were made to visualize the ureters but it was difficult to see through the retroperitoneal adipose tissue. There was no obvious injury or did not appear that any of these sutures were near that area. The pressure was taken down to 5 and one area of bleeding noted on a utero-ovarian ligament. This was then controlled with the harmonic scalpel and no further bleeding noted. Pelvis was irrigated copiously and pressure again taken down with no active bleeding noted. The umbilical port and the right port were then removed under direct visualization and the insufflator port utilized to remove the gas from the abdomen and then removed. All port incisions were then closed with one subcuticular stitch of 3-0 Vicryl and Dermabond. All instruments and sponge counts were correct and the patient was taken to the recovery room awakened and in good condition.

## 2014-12-20 NOTE — Transfer of Care (Signed)
Immediate Anesthesia Transfer of Care Note  Patient: Cathy [name]  Procedure(s) Performed: Procedure(s): LAPAROSCOPIC ASSISTED VAGINAL HYSTERECTOMY (N/A) BILATERAL SALPINGECTOMY (Bilateral)  Patient Location: PACU  Anesthesia Type:General  Level of Consciousness: awake, alert  and oriented  Airway & Oxygen Therapy: Patient Spontanous Breathing and Patient connected to nasal cannula oxygen  Post-op Assessment: Report given to PACU RN, Post -op Vital signs reviewed and stable and Patient moving all extremities X 4  Post vital signs: Reviewed and stable  Complications: No apparent anesthesia complications

## 2014-12-20 NOTE — Anesthesia Postprocedure Evaluation (Signed)
  Anesthesia Post Note  Patient: Cathy [name]  Procedure(s) Performed: Procedure(s) (LRB): LAPAROSCOPIC ASSISTED VAGINAL HYSTERECTOMY (N/A) BILATERAL SALPINGECTOMY (Bilateral)  Anesthesia type: GA  Patient location: PACU  Post pain: Pain level controlled  Post assessment: Post-op Vital signs reviewed  Last Vitals:  Filed Vitals:   12/20/14 0923  BP: 123/52  Pulse: 62  Temp: 36.6 C  Resp: 18    Post vital signs: Reviewed  Level of consciousness: sedated  Complications: No apparent anesthesia complications

## 2014-12-20 NOTE — Progress Notes (Signed)
Post-op check POD #0 LAVH Doing ok, pain ok, mild nausea Afeb, VSS, adequate clear urine Abd- soft Continue current care

## 2014-12-20 NOTE — Anesthesia Preprocedure Evaluation (Signed)
Anesthesia Evaluation  Patient identified by MRN, date of birth, ID band Patient awake    Reviewed: Allergy & Precautions, H&P , Patient's Chart, lab work & pertinent test results, reviewed documented beta blocker date and time   History of Anesthesia Complications Negative for: history of anesthetic complications  Airway Mallampati: II  TM Distance: >3 FB Neck ROM: full    Dental   Pulmonary  breath sounds clear to auscultation        Cardiovascular Exercise Tolerance: Good Rhythm:regular Rate:Normal     Neuro/Psych  Headaches, PSYCHIATRIC DISORDERS Depression    GI/Hepatic   Endo/Other    Renal/GU      Musculoskeletal   Abdominal   Peds  Hematology   Anesthesia Other Findings Juvenile epilepsy- none since 1387  Reproductive/Obstetrics                             Anesthesia Physical Anesthesia Plan  ASA: II  Anesthesia Plan: General ETT   Post-op Pain Management:    Induction:   Airway Management Planned:   Additional Equipment:   Intra-op Plan:   Post-operative Plan:   Informed Consent: I have reviewed the patients History and Physical, chart, labs and discussed the procedure including the risks, benefits and alternatives for the proposed anesthesia with the patient or authorized representative who has indicated his/her understanding and acceptance.   Dental Advisory Given  Plan Discussed with: CRNA and Surgeon  Anesthesia Plan Comments:         Anesthesia Quick Evaluation

## 2014-12-20 NOTE — Progress Notes (Signed)
Patient ID: Cathy [name], female   DOB: [birth year], 48 y.o.   MRN: [id number] Per pt no changes in dictated H&P. Brief exam WNL.  Just finishing menses.

## 2014-12-20 NOTE — Addendum Note (Signed)
Addendum  created 12/20/14 1630 by Raenette Rover, CRNA   Modules edited: Notes Section   Notes Section:  File: [id number]

## 2014-12-20 NOTE — Anesthesia Postprocedure Evaluation (Signed)
  Anesthesia Post-op Note  Patient: Cathy [name]  Procedure(s) Performed: Procedure(s): LAPAROSCOPIC ASSISTED VAGINAL HYSTERECTOMY (N/A) BILATERAL SALPINGECTOMY (Bilateral)  Patient Location: [facility]  Anesthesia Type:General  Level of Consciousness: awake, alert , oriented and patient cooperative  Airway and Oxygen Therapy: Patient Spontanous Breathing and Patient connected to nasal cannula oxygen  Post-op Pain: mild  Post-op Assessment: Post-op Vital signs reviewed, Patient's Cardiovascular Status Stable, Respiratory Function Stable, Patent Airway and No signs of Nausea or vomiting  Post-op Vital Signs: Reviewed and stable  Last Vitals:  Filed Vitals:   12/20/14 1541  BP:   Pulse: 75  Temp:   Resp: 17    Complications: No apparent anesthesia complications

## 2014-12-21 ENCOUNTER — Encounter (HOSPITAL_COMMUNITY): Payer: Self-pay | Admitting: Obstetrics and Gynecology

## 2014-12-21 DIAGNOSIS — D259 Leiomyoma of uterus, unspecified: Secondary | ICD-10-CM | POA: Diagnosis not present

## 2014-12-21 LAB — BASIC METABOLIC PANEL
Anion gap: 4 — ABNORMAL LOW (ref 5–15)
BUN: 7 mg/dL (ref 6–23)
CHLORIDE: 108 meq/L (ref 96–112)
CO2: 27 mmol/L (ref 19–32)
Calcium: 8.5 mg/dL (ref 8.4–10.5)
Creatinine, Ser: 0.58 mg/dL (ref 0.50–1.10)
GFR calc non Af Amer: >90 mL/min (ref >90)
Glucose, Bld: 135 mg/dL — ABNORMAL HIGH (ref 70–99)
POTASSIUM: 4.1 mmol/L (ref 3.5–5.1)
Sodium: 139 mmol/L (ref 135–145)

## 2014-12-21 LAB — CBC
HEMATOCRIT: 31 % — AB (ref 36.0–46.0)
HEMOGLOBIN: 10.8 g/dL — AB (ref 12.0–15.0)
MCH: 32.8 pg (ref 26.0–34.0)
MCHC: 34.8 g/dL (ref 30.0–36.0)
MCV: 94.2 fL (ref 78.0–100.0)
Platelets: 186 10*3/uL (ref 150–400)
RBC: 3.29 MIL/uL — AB (ref 3.87–5.11)
RDW: 13.1 % (ref 11.5–15.5)
WBC: 8.5 10*3/uL (ref 4.0–10.5)

## 2014-12-21 MED ORDER — OXYCODONE-ACETAMINOPHEN 5-325 MG PO TABS: 2.0000 | ORAL_TABLET | Freq: Four times a day (QID) | ORAL | Status: DC | PRN

## 2014-12-21 MED ORDER — OXYCODONE-ACETAMINOPHEN 5-325 MG PO TABS
2.0000 | ORAL_TABLET | Freq: Four times a day (QID) | ORAL | Status: DC | PRN
  Administered 2014-12-21: 2 via ORAL
  Filled 2014-12-21: qty 2

## 2014-12-21 MED ORDER — IBUPROFEN 600 MG PO TABS: 600.0000 mg | ORAL_TABLET | Freq: Four times a day (QID) | ORAL | Status: DC | PRN

## 2014-12-21 NOTE — Progress Notes (Signed)
1 Day Post-Op Procedure(s) (LRB): LAPAROSCOPIC ASSISTED VAGINAL HYSTERECTOMY (N/A) BILATERAL SALPINGECTOMY (Bilateral)  Subjective: Patient reports tolerating PO.  Her nausea has resolved.  No flatus yet.  Feels a little dizzy when ambulating  Objective: I have reviewed patient's vital signs, intake and output and labs.  General: alert and cooperative GI: incision: clean, dry and intact and soft NT Vaginal Bleeding: minimal  Assessment: s/p Procedure(s): LAPAROSCOPIC ASSISTED VAGINAL HYSTERECTOMY (N/A) BILATERAL SALPINGECTOMY (Bilateral): stable  Plan: Advance diet Advance to PO medication Discontinue IV fluids Discharge home this PM if doing well.  LOS: 1 day    Nazario Russom W 12/21/2014, 8:42 AM

## 2014-12-21 NOTE — Discharge Summary (Signed)
Physician Discharge Summary  Patient ID: Cathy [name] MRN: [id number] DOB/AGE: Aug 22, 1966 48 y.o.  Admit date: 12/20/2014 Discharge date: 12/21/2014  Admission Diagnoses:                                          Abnormal uterine bleeding                                          Pelvic pain  Discharge Diagnoses: same Active Problems:   S/P laparoscopic assisted vaginal hysterectomy (LAVH)   Discharged Condition: good  Hospital Course: Pt admitted for routine post-operative care following LAVH/Bilateral salpingectomy and did well.  By postoperative day #1 she was tolerating regular diet and ambulating.  She is to have a voiding trial and change to po medications and if tolerated will d/c home this PM.  Consults: None  Significant Diagnostic Studies: labs: CBC  Treatments: surgery: LAVH/bilateral salpingectomy  Discharge Exam: Blood pressure 88/46, pulse 52, temperature 99 F (37.2 C), temperature source Oral, resp. rate 18, height 5\' 1"  (1.549 m), weight 64.864 kg (143 lb), SpO2 96 %. General appearance: alert and cooperative GI: soft NT and incision c/d/i  Disposition:   Discharge Instructions    Diet - low sodium heart healthy    Complete by:  As directed      Discharge instructions    Complete by:  As directed   Avoid driving for at least 1-2 weeks or until off narcotic pain meds.  No heavy lifting greater than 10 lbs.  Nothing in vagina for 6 weeks.  May remove bandage in 1-2 days.  Shower over incision and pat dry.     Increase activity slowly    Complete by:  As directed             Medication List    STOP taking these medications        amoxicillin 875 MG tablet  Commonly known as:  AMOXIL      TAKE these medications        ibuprofen 600 MG tablet  Commonly known as:  ADVIL,MOTRIN  Take 1 tablet (600 mg total) by mouth every 6 (six) hours as needed for mild pain.     oxyCODONE-acetaminophen 5-325 MG per tablet  Commonly known as:  PERCOCET/ROXICET   Take 2 tablets by mouth every 6 (six) hours as needed for moderate pain.           Follow-up Information    Follow up with Logan Bores, MD. Schedule an appointment as soon as possible for a visit in 2 weeks.   Specialty:  Obstetrics and Gynecology   Why:  incision check   Contact information:   [address] [place] [address] [phone]       Signed: Logan Bores 12/21/2014, 8:46 AM

## 2015-02-04 ENCOUNTER — Other Ambulatory Visit: Payer: Self-pay | Admitting: Obstetrics and Gynecology

## 2015-02-04 DIAGNOSIS — R928 Other abnormal and inconclusive findings on diagnostic imaging of breast: Secondary | ICD-10-CM

## 2015-02-15 ENCOUNTER — Ambulatory Visit
Admission: RE | Admit: 2015-02-15 | Discharge: 2015-02-15 | Disposition: A | Discharge status: Home / Self Care | Payer: Managed Care, Other (non HMO) | Source: Ambulatory Visit | Attending: Obstetrics and Gynecology | Admitting: Obstetrics and Gynecology

## 2015-02-15 ENCOUNTER — Other Ambulatory Visit: Payer: Self-pay | Admitting: Obstetrics and Gynecology

## 2015-02-15 DIAGNOSIS — R928 Other abnormal and inconclusive findings on diagnostic imaging of breast: Secondary | ICD-10-CM

## 2015-05-20 ENCOUNTER — Ambulatory Visit (INDEPENDENT_AMBULATORY_CARE_PROVIDER_SITE_OTHER): Payer: Managed Care, Other (non HMO) | Admitting: Nurse Practitioner

## 2015-05-20 ENCOUNTER — Telehealth: Payer: Self-pay | Admitting: Nurse Practitioner

## 2015-05-20 VITALS — BP 113/73 | HR 70 | Temp 98.0°F | Ht 61.0 in | Wt 143.0 lb

## 2015-05-20 DIAGNOSIS — R4589 Other symptoms and signs involving emotional state: Secondary | ICD-10-CM | POA: Insufficient documentation

## 2015-05-20 DIAGNOSIS — G4700 Insomnia, unspecified: Secondary | ICD-10-CM | POA: Diagnosis not present

## 2015-05-20 DIAGNOSIS — R4586 Emotional lability: Secondary | ICD-10-CM | POA: Insufficient documentation

## 2015-05-20 DIAGNOSIS — F39 Unspecified mood [affective] disorder: Secondary | ICD-10-CM

## 2015-05-20 DIAGNOSIS — F329 Major depressive disorder, single episode, unspecified: Secondary | ICD-10-CM

## 2015-05-20 LAB — CBC WITH DIFFERENTIAL/PLATELET
BASOS PCT: 0.4 % (ref 0.0–3.0)
Basophils Absolute: 0 10*3/uL (ref 0.0–0.1)
EOS ABS: 0.1 10*3/uL (ref 0.0–0.7)
Eosinophils Relative: 1.1 % (ref 0.0–5.0)
HEMATOCRIT: 40.8 % (ref 36.0–46.0)
HEMOGLOBIN: 13.8 g/dL (ref 12.0–15.0)
LYMPHS ABS: 2.3 10*3/uL (ref 0.7–4.0)
Lymphocytes Relative: 30.1 % (ref 12.0–46.0)
MCHC: 33.8 g/dL (ref 30.0–36.0)
MCV: 95.2 fl (ref 78.0–100.0)
Monocytes Absolute: 0.4 10*3/uL (ref 0.1–1.0)
Monocytes Relative: 5.4 % (ref 3.0–12.0)
NEUTROS ABS: 4.9 10*3/uL (ref 1.4–7.7)
NEUTROS PCT: 63 % (ref 43.0–77.0)
Platelets: 238 10*3/uL (ref 150.0–400.0)
RBC: 4.29 Mil/uL (ref 3.87–5.11)
RDW: 13.7 % (ref 11.5–15.5)
WBC: 7.8 10*3/uL (ref 4.0–10.5)

## 2015-05-20 LAB — COMPREHENSIVE METABOLIC PANEL
ALT: 8 U/L (ref 0–35)
AST: 9 U/L (ref 0–37)
Albumin: 4.7 g/dL (ref 3.5–5.2)
Alkaline Phosphatase: 55 U/L (ref 39–117)
BUN: 8 mg/dL (ref 6–23)
CO2: 27 meq/L (ref 19–32)
CREATININE: 0.87 mg/dL (ref 0.40–1.20)
Calcium: 9.6 mg/dL (ref 8.4–10.5)
Chloride: 102 mEq/L (ref 96–112)
GFR: 73.89 mL/min (ref >60.00)
Glucose, Bld: 100 mg/dL — ABNORMAL HIGH (ref 70–99)
Potassium: 3.7 mEq/L (ref 3.5–5.1)
Sodium: 138 mEq/L (ref 135–145)
Total Bilirubin: 0.5 mg/dL (ref 0.2–1.2)
Total Protein: 7.2 g/dL (ref 6.0–8.3)

## 2015-05-20 LAB — VITAMIN D 25 HYDROXY (VIT D DEFICIENCY, FRACTURES): VITD: 39.51 ng/mL (ref 30.00–100.00)

## 2015-05-20 LAB — TSH: TSH: 1.65 u[IU]/mL (ref 0.35–4.50)

## 2015-05-20 LAB — VITAMIN B12: VITAMIN B 12: 265 pg/mL (ref 211–911)

## 2015-05-20 LAB — T4, FREE: FREE T4: 0.7 ng/dL (ref 0.60–1.60)

## 2015-05-20 MED ORDER — LORAZEPAM 0.5 MG PO TABS: 0.5000 mg | ORAL_TABLET | Freq: Every day | ORAL | Status: DC

## 2015-05-20 MED ORDER — SERTRALINE HCL 50 MG PO TABS: 50.0000 mg | ORAL_TABLET | Freq: Every day | ORAL | Status: DC

## 2015-05-20 NOTE — Progress Notes (Signed)
Pre visit review using our clinic review tool, if applicable. No additional management support is needed unless otherwise documented below in the visit note. 

## 2015-05-20 NOTE — Patient Instructions (Signed)
(  855) [place] 24-hour HelpLine at (215)423-9263 or [phone] for immediate assistance for mental health issues.   [facility] [phone]. [address] [place], [place] [address]  Please use any above resources for help over the weekend.  You may go directly to [facility], but it is a good idea to call first to let them know you are coming. You may also go directly to [facility] long ER .  Please start lorazepam. Take 30 minutes before bedtime to improve sleep. Start zoloft. Take at night. If you feel it keeps you awake, take in morning.  Please see me next week if you have not had appointment at [facility].  My office will call with lab results.

## 2015-05-20 NOTE — Telephone Encounter (Signed)
Called old Cathy [name], spoke w/"intake department" Pt can be seen on Monday & evaluated for in or out pt therapy. Pt agrees to call old Cathy [name] today to schedule appt. She has phone number.

## 2015-05-20 NOTE — Progress Notes (Signed)
Subjective:   Cathy [name] is an 49 y.o. female who presents for evaluation and treatment of depressive symptoms. Pt gives Hx of being treated for depression since she was in 20's. She has been treated with both medications and therapy. She took prozac in 20's, then changed to celexa after pregnancy. She suffered PPD with all 3 pregnancies. Last provider to treat depression was Dr Juliann Pulse Richardson/gynecology. Pt stopped meds 3 yrs ago due to divorce and "custody battle" stating "my husband would have used my depression against me". Last counseling session was 1 yr ago. She has been involved in family counseling as well.  Today she reports increase in anger outbursts & crying over last few months. She feels hopeless. Symptoms: trouble sleeping, concentrating, not eating, not wanting to get out of bed, not wanting to wake up: she has wondered what she could take that would "keep me from waking up".   When asked if she feels she is in crisis, she responds "yes". When asked if she is willing to go to ER today for treatment she is reluctant: "I have my kids this weekend, I really want to see them. One of my son's birthday was last weekend & I didn't get to see him. We are planning to celebrate this weekend." When asked if she feels she will enjoy having kids, she says:"yes, but depends on what happens at soccer game, but my mother will be with me." She prefers to be home this weekend & see someone next week. She agrees to go to ER over weekend if she feels mood is escalating. Discussed starting SSRI & lorazepam to help w/sleep. Pt understands these meds will not work right away. Discussed potential SE.   Tearfulness: Moderate  Other/Psychosocial Stressors: feels guilt regarding Children, ex-husband is "difficult" & they have poor communication  Review of Systems Pertinent items are noted in HPI.   Objective:   Mental Status Examination: Posture and motor behavior: Appropriate Dress, grooming, personal  hygiene: Appropriate Facial expression: mostly tearful, but smiles appropriately Speech: Appropriate Mood: sad, tearful Coherency and relevance of thought: Appropriate Thought content: Appropriate Perceptions: Appropriate Orientation:Appropriate Attention and concentration: Appropriate Vocabulary: Appropriate  Assessment: Plan   1. Mood changes Long Hx of depression. Stopped meds 3 yrs ago.  - CBC with Differential/Platelet - Comprehensive metabolic panel - T4, free - TSH - Vitamin B12 - Vit D  25 hydroxy (rtn osteoporosis monitoring) - sertraline (ZOLOFT) 50 MG tablet; Take 1 tablet (50 mg total) by mouth at bedtime.  Dispense: 30 tablet; Refill: 0 - Ambulatory referral to Psychiatry  2. Insomnia - LORazepam (ATIVAN) 0.5 MG tablet; Take 1 tablet (0.5 mg total) by mouth at bedtime.  Dispense: 5 tablet; Refill: 0  3. Hopelessness Called [facility]. They can see her on Monday. Will fax OV note. Pt is to call to schedule appt. Pt understands she can go to [facility] ER over weekend or use crisis number if needed. - Ambulatory referral to Psychiatry  F/u 1 week Spent 25 Minutes with patient, 50% or more was spent in counseling.

## 2015-05-24 ENCOUNTER — Ambulatory Visit (INDEPENDENT_AMBULATORY_CARE_PROVIDER_SITE_OTHER): Payer: Managed Care, Other (non HMO) | Admitting: Nurse Practitioner

## 2015-05-24 ENCOUNTER — Encounter: Payer: Self-pay | Admitting: Nurse Practitioner

## 2015-05-24 ENCOUNTER — Telehealth: Payer: Self-pay | Admitting: Nurse Practitioner

## 2015-05-24 VITALS — BP 109/68 | HR 101 | Temp 98.3°F | Ht 61.0 in | Wt 139.0 lb

## 2015-05-24 DIAGNOSIS — F329 Major depressive disorder, single episode, unspecified: Secondary | ICD-10-CM | POA: Diagnosis not present

## 2015-05-24 DIAGNOSIS — K30 Functional dyspepsia: Secondary | ICD-10-CM | POA: Diagnosis not present

## 2015-05-24 DIAGNOSIS — R4589 Other symptoms and signs involving emotional state: Secondary | ICD-10-CM

## 2015-05-24 DIAGNOSIS — F458 Other somatoform disorders: Secondary | ICD-10-CM

## 2015-05-24 MED ORDER — ONDANSETRON 8 MG PO TBDP: 8.0000 mg | ORAL_TABLET | Freq: Two times a day (BID) | ORAL | Status: DC | PRN

## 2015-05-24 MED ORDER — FAMOTIDINE 20 MG PO TABS: 20.0000 mg | ORAL_TABLET | Freq: Every day | ORAL | Status: DC

## 2015-05-24 NOTE — Progress Notes (Signed)
Pre visit review using our clinic review tool, if applicable. No additional management support is needed unless otherwise documented below in the visit note. 

## 2015-05-24 NOTE — Patient Instructions (Signed)
Please take zofran for nausea. Start pepcid. Take daily until i see you again. Please take ativan to help you sleep.  Eat foods that are nutrient dense: fruits, vegetables, berries, melon, nuts, seeds, whole grains (4 grams fiber or more per serving).  Please see behavioral health and psychiatry.  Please see me next week.  Please use these resources if you need immediate help:  [place] 24-hour HelpLine at 626-817-0380 or [phone] for immediate assistance for mental health issues.  [facility] [phone]. [address] [place], [place] [address]

## 2015-05-24 NOTE — Telephone Encounter (Signed)
Will await calling due to patient has an appointment at 1p.

## 2015-05-24 NOTE — Telephone Encounter (Signed)
Patient informed of results at [facility]. Didn't get to see psychiatry yesterday due to scheduling issues.

## 2015-05-24 NOTE — Progress Notes (Signed)
Subjective:     Cathy [name] is a 49 y.o. female who presents for follow up of depression. Current symptoms include depressed mood, difficulty concentrating, feelings of worthlessness/guilt, hopelessness, insomnia, psychomotor agitation, suicidal thoughts without plan and weight loss. Symptoms have been unchanged since I saw her in ofc last week. She did not go to [facility] for evaluation after speaking w/staff, due to she fears the time away from work will affect her job [employer]. 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 She started zoloft last week. She has not started taking lorazepam. She is not sleeping well, feels shaky, not eating due to nausea-lost 4 more lbs in 5 days; having loose stools for several mos.  Had episode of cp 5da described as sharp. Denies SOB, palpitations, dizzy, diaphoresis. Discussed need for nutrition-choose nutrient dense foods, start b complex as she is borderline low. She says only reason not eating is due to nausea-food seems to make it worse. Will start pepcid & zofran.  She reports still having thoughts of "not wanting to wake up". Asked why she keeps going: replies "for my kids".  The following portions of the patient's history were reviewed and updated as appropriate: allergies, current medications, past medical history, past social history, past surgical history and problem list.  Review of Systems Pertinent items are noted in HPI.    Objective:    BP 109/68 mmHg  Pulse 101  Temp(Src) 98.3 F (36.8 C) (Oral)  Ht 5\' 1"  (1.549 m)  Wt 139 lb (63.05 kg)  BMI 26.28 kg/m2  SpO2 97%  LMP 11/19/2014  General:  alert, cooperative, appears stated age and mild distress  Affect & Behavior:  good grooming and not crying, but near tears       Assessment:Plan  1. Nervous stomach 4 lb wt loss in 5 dys start - ondansetron (ZOFRAN-ODT) 8 MG disintegrating tablet; Take 1 tablet (8 mg total) by mouth 2 (two) times daily as needed for  nausea or vomiting.  Dispense: 20 tablet; Refill: 0 - famotidine (PEPCID) 20 MG tablet; Take 1 tablet (20 mg total) by mouth daily.  Dispense: 30 tablet; Refill: 0 Must eat nutrient dense foods. Discussed. See pt instructions.   2. Hopelessness Pt declined day program at [facility] after speaking with them.  - Ambulatory referral to Psychiatry - Ambulatory referral to Psychology  F/u 1 week-wt, emotional state

## 2015-05-24 NOTE — Telephone Encounter (Signed)
pls call pt: Advise B12 is slightly low. Pls start B complex. Take enough to get 1200 mcg or 1 mg B12 daily. Schedule OV in 1 mo to check level again. Pls ask if she saw psychiatry yesterday.

## 2015-05-31 ENCOUNTER — Ambulatory Visit (INDEPENDENT_AMBULATORY_CARE_PROVIDER_SITE_OTHER): Payer: Managed Care, Other (non HMO) | Admitting: Licensed Clinical Social Worker

## 2015-05-31 ENCOUNTER — Encounter: Payer: Self-pay | Admitting: Nurse Practitioner

## 2015-05-31 ENCOUNTER — Ambulatory Visit (INDEPENDENT_AMBULATORY_CARE_PROVIDER_SITE_OTHER): Payer: Managed Care, Other (non HMO) | Admitting: Nurse Practitioner

## 2015-05-31 VITALS — BP 129/81 | HR 60 | Temp 98.2°F | Resp 18 | Ht 61.0 in | Wt 139.0 lb

## 2015-05-31 DIAGNOSIS — F332 Major depressive disorder, recurrent severe without psychotic features: Secondary | ICD-10-CM | POA: Diagnosis not present

## 2015-05-31 DIAGNOSIS — R634 Abnormal weight loss: Secondary | ICD-10-CM | POA: Insufficient documentation

## 2015-05-31 DIAGNOSIS — F419 Anxiety disorder, unspecified: Secondary | ICD-10-CM | POA: Diagnosis not present

## 2015-05-31 DIAGNOSIS — F5105 Insomnia due to other mental disorder: Secondary | ICD-10-CM | POA: Diagnosis not present

## 2015-05-31 MED ORDER — ZOLPIDEM TARTRATE 5 MG PO TABS: 5.0000 mg | ORAL_TABLET | Freq: Every evening | ORAL | Status: DC | PRN

## 2015-05-31 NOTE — Progress Notes (Signed)
Subjective:     Cathy [name] is a 50 y.o. female presents for f/u hopelessness, weight loss.  She was seen in ofc 1 wk ago & had lost 4 lbs in 5 days due to anxiety & depression. She was started on zoloft 50 mg 2 wks ago and pepcid & zofran last week. She denies intol SE to meds. She has not noted improvement in mood since starting zoloft. Discussed may take 3 to 6 weeks for efficacy. Will increase in 2 weeks if no improvemnt.  Today her wt is stable. She had appt w/BH today & has another appt scheduled next wk. She has not seen psychiatry yet. I want psychiatry to evaluate her & assume treatment for mood disorder. She has not been sleeping well: often cannot fall asleep, then wakes about 2 or 3 am & cannot go back to sleep. Ativan has helped. We discussed developing bedtime routine & other meds to help w/sleep. Her schedule changes slightly every 2 weeks when her children are with her.  She continues to struggle with "finding happiness", this has been longstanding-since 20's. Family dynamics contribute to stress. She has ruminating thoughts about family situations. Still having thoughts of not wanting to wake. Has no plan to harm self. Took some vacation time this week to be involved in school parties for her kids.  The following portions of the patient's history were reviewed and updated as appropriate: allergies, current medications, past medical history, past social history, past surgical history and problem list.  Review of Systems Constitutional: negative for fatigue Cardiovascular: negative for irregular heart beat Gastrointestinal: positive for diarrhea Behavioral/Psych: negative for excessive alcohol consumption and illegal drug usage    Objective:    BP 129/81 mmHg  Pulse 60  Temp(Src) 98.2 F (36.8 C) (Temporal)  Resp 18  Ht 5\' 1"  (1.549 m)  Wt 139 lb (63.05 kg)  BMI 26.28 kg/m2  SpO2 99%  LMP 11/19/2014 BP 129/81 mmHg  Pulse 60  Temp(Src) 98.2 F (36.8 C) (Temporal)  Resp  18  Ht 5\' 1"  (1.549 m)  Wt 139 lb (63.05 kg)  BMI 26.28 kg/m2  SpO2 99%  LMP 11/19/2014 General appearance: alert, cooperative, appears stated age and no distress Head: Normocephalic, without obvious abnormality, atraumatic Eyes: negative findings: lids and lashes normal and conjunctivae and sclerae normal Neurologic: Grossly normal Psych: Interactive, less tearful, varied facial expressions-appropriate to topic. Well groomed     Assessment:Plan     1. Insomnia secondary to anxiety start - zolpidem (AMBIEN) 5 MG tablet; Take 1 tablet (5 mg total) by mouth at bedtime as needed for sleep.  Dispense: 15 tablet; Refill: 0  2. Loss of weight Stable Continue pepcid & zofran PRN  F/u 2 weeks

## 2015-05-31 NOTE — Patient Instructions (Signed)
Start using [place]. Take at least 6 hrs before you want to wake. Develop a bedtime routine-same activities & same general time of going to seep & waking. Do not watch TV or use computer within 1 hour of wanting to fall asleep.   Continue zofran & pepcid as needed. Continue B complex.  Continue zofran.  See me in 2 weeks. If you have been able to see psychiatry before then, you may cancel appt.  Use these resources if you are feeling worse and need immediate attention.   [place] 24-hour HelpLine at 810-574-2412 or [phone] for immediate assistance for mental health issues.  [facility] [phone]. [address] [place], [place] [address]

## 2015-05-31 NOTE — Progress Notes (Signed)
Pre visit review using our clinic review tool, if applicable. No additional management support is needed unless otherwise documented below in the visit note. 

## 2015-06-10 ENCOUNTER — Ambulatory Visit (INDEPENDENT_AMBULATORY_CARE_PROVIDER_SITE_OTHER): Payer: Managed Care, Other (non HMO) | Admitting: Licensed Clinical Social Worker

## 2015-06-10 ENCOUNTER — Telehealth: Payer: Self-pay | Admitting: Family Medicine

## 2015-06-10 DIAGNOSIS — F332 Major depressive disorder, recurrent severe without psychotic features: Secondary | ICD-10-CM

## 2015-06-10 NOTE — Telephone Encounter (Signed)
Patient states that she is still very nauseated, HA, having trouble eating and has a lot of fatigue.  She is wondering if the medication that was rx'd?   Please advise.  She did go see Melinda Crutch as recommended.

## 2015-06-10 NOTE — Telephone Encounter (Signed)
Pt will try zofran again.  She hasn't really been using it per our discussion.   She will call for appt next week if not better.

## 2015-06-16 ENCOUNTER — Ambulatory Visit (HOSPITAL_COMMUNITY): Payer: Self-pay | Admitting: Psychiatry

## 2015-06-16 ENCOUNTER — Other Ambulatory Visit: Payer: Self-pay | Admitting: Nurse Practitioner

## 2015-06-16 DIAGNOSIS — F5105 Insomnia due to other mental disorder: Principal | ICD-10-CM

## 2015-06-16 DIAGNOSIS — R4586 Emotional lability: Secondary | ICD-10-CM

## 2015-06-16 DIAGNOSIS — F419 Anxiety disorder, unspecified: Secondary | ICD-10-CM

## 2015-06-16 MED ORDER — SERTRALINE HCL 50 MG PO TABS: 50.0000 mg | ORAL_TABLET | Freq: Every day | ORAL | Status: DC

## 2015-06-16 MED ORDER — ZOLPIDEM TARTRATE 5 MG PO TABS: 5.0000 mg | ORAL_TABLET | Freq: Every evening | ORAL | Status: DC | PRN

## 2015-06-16 NOTE — Telephone Encounter (Signed)
Please advise.  Pt was supposed to have her appointment today with psychiatry but she was too late and they had to reschedule appointment.

## 2015-06-16 NOTE — Telephone Encounter (Signed)
Rx faxed to pharmacy  

## 2015-06-16 NOTE — Telephone Encounter (Signed)
Patient is running out of Ambien & will be out of Zoloft before her scheduled appt with her psychiatrist on 07/21/15. She uses [facility].

## 2015-06-21 ENCOUNTER — Ambulatory Visit (INDEPENDENT_AMBULATORY_CARE_PROVIDER_SITE_OTHER): Payer: Managed Care, Other (non HMO) | Admitting: Licensed Clinical Social Worker

## 2015-06-21 DIAGNOSIS — F332 Major depressive disorder, recurrent severe without psychotic features: Secondary | ICD-10-CM | POA: Diagnosis not present

## 2015-07-01 ENCOUNTER — Other Ambulatory Visit: Payer: Self-pay | Admitting: Family Medicine

## 2015-07-01 DIAGNOSIS — F458 Other somatoform disorders: Secondary | ICD-10-CM

## 2015-07-01 MED ORDER — FAMOTIDINE 20 MG PO TABS: 20.0000 mg | ORAL_TABLET | Freq: Every day | ORAL | Status: DC

## 2015-07-07 ENCOUNTER — Ambulatory Visit (INDEPENDENT_AMBULATORY_CARE_PROVIDER_SITE_OTHER): Payer: Managed Care, Other (non HMO) | Admitting: Licensed Clinical Social Worker

## 2015-07-07 DIAGNOSIS — F332 Major depressive disorder, recurrent severe without psychotic features: Secondary | ICD-10-CM

## 2015-07-29 ENCOUNTER — Ambulatory Visit: Payer: Managed Care, Other (non HMO) | Admitting: Licensed Clinical Social Worker

## 2015-08-01 ENCOUNTER — Other Ambulatory Visit: Payer: Self-pay | Admitting: Family Medicine

## 2015-08-01 DIAGNOSIS — F458 Other somatoform disorders: Secondary | ICD-10-CM

## 2015-08-01 MED ORDER — FAMOTIDINE 20 MG PO TABS: 20.0000 mg | ORAL_TABLET | Freq: Every day | ORAL | Status: DC

## 2015-08-04 ENCOUNTER — Ambulatory Visit: Payer: Managed Care, Other (non HMO) | Admitting: Licensed Clinical Social Worker

## 2016-06-05 LAB — HM MAMMOGRAPHY

## 2016-06-07 ENCOUNTER — Encounter: Payer: Self-pay | Admitting: Family Medicine

## 2016-06-07 DIAGNOSIS — B5801 Toxoplasma chorioretinitis: Secondary | ICD-10-CM | POA: Insufficient documentation

## 2016-06-23 DIAGNOSIS — A630 Anogenital (venereal) warts: Secondary | ICD-10-CM

## 2016-06-23 HISTORY — DX: Anogenital (venereal) warts: A63.0

## 2016-07-20 ENCOUNTER — Telehealth: Payer: Self-pay | Admitting: Family Medicine

## 2016-07-20 NOTE — Telephone Encounter (Signed)
Advised patient we are receiving records from patient's OBGYN. When I called patient she said she will eventually establish care with Dr. Raoul Pitch.

## 2016-07-20 NOTE — Telephone Encounter (Signed)
Please call pt: - She should be encouraged to make an established visit. In order to place information in her chart there needs to be an appt made.

## 2016-07-23 NOTE — Telephone Encounter (Signed)
Patient declined NP appt. She will CB to make an appointment later.

## 2017-06-04 ENCOUNTER — Ambulatory Visit (INDEPENDENT_AMBULATORY_CARE_PROVIDER_SITE_OTHER): Payer: Managed Care, Other (non HMO) | Admitting: Cardiovascular Disease

## 2017-06-04 ENCOUNTER — Encounter (INDEPENDENT_AMBULATORY_CARE_PROVIDER_SITE_OTHER): Payer: Self-pay

## 2017-06-04 ENCOUNTER — Encounter: Payer: Self-pay | Admitting: Cardiovascular Disease

## 2017-06-04 VITALS — BP 132/90 | HR 70 | Ht 61.0 in | Wt 161.8 lb

## 2017-06-04 DIAGNOSIS — Z13220 Encounter for screening for lipoid disorders: Secondary | ICD-10-CM | POA: Diagnosis not present

## 2017-06-04 DIAGNOSIS — R0789 Other chest pain: Secondary | ICD-10-CM | POA: Insufficient documentation

## 2017-06-04 DIAGNOSIS — R079 Chest pain, unspecified: Secondary | ICD-10-CM

## 2017-06-04 DIAGNOSIS — I10 Essential (primary) hypertension: Secondary | ICD-10-CM

## 2017-06-04 DIAGNOSIS — R0609 Other forms of dyspnea: Secondary | ICD-10-CM

## 2017-06-04 MED ORDER — LISINOPRIL 5 MG PO TABS: 5.0000 mg | ORAL_TABLET | Freq: Every day | ORAL | 3 refills | Status: DC

## 2017-06-04 NOTE — Assessment & Plan Note (Signed)
History of dyspnea on exertion over the last several months. She has gained 20 pounds although in the past she lost 50 pounds or more. She does have a soft outflow tract murmur. I'm going to obtain a 2-D echo to further evaluate

## 2017-06-04 NOTE — Patient Instructions (Signed)
Medication Instructions: Your physician recommends that you continue on your current medications as directed. Please refer to the Current Medication list given to you today.  START Lisinopril 5 mg daily   Labwork: Your physician recommends that you return for a FASTING lipid profile and hepatic function panel, TSH, Free, T4, BMET--2 weeks.   Testing/Procedures: Your physician has requested that you have an exercise stress myoview. For further information please visit [email or web address]. Please follow instruction sheet, as given.  Your physician has requested that you have an echocardiogram. Echocardiography is a painless test that uses sound waves to create images of your heart. It provides your doctor with information about the size and shape of your heart and how well your heart's chambers and valves are working. This procedure takes approximately one hour. There are no restrictions for this procedure.  Your physician has recommended that you have a sleep study. This test records several body functions during sleep, including: brain activity, eye movement, oxygen and carbon dioxide blood levels, heart rate and rhythm, breathing rate and rhythm, the flow of air through your mouth and nose, snoring, body muscle movements, and chest and belly movement.  Follow-Up: Your physician recommends that you schedule a follow-up appointment in: 1 month with PharmD for BP Check. Your physician has requested that you regularly monitor and record your blood pressure readings at home. Please use the same machine at the same time of day to check your readings and record them to bring to your follow-up visit.  Your physician recommends that you schedule a follow-up appointment in: 4-6 weeks with Dr. Gwenlyn Found.  If you need a refill on your cardiac medications before your next appointment, please call your pharmacy.

## 2017-06-04 NOTE — Addendum Note (Signed)
Addended by: Therisa Doyne on: 06/04/2017 08:28 AM   Modules accepted: Orders

## 2017-06-04 NOTE — Assessment & Plan Note (Signed)
History of atypical chest pain over the last several months occurring randomly convalescing at that time with relation to right upper extremity. We will obtain a exercise Myoview stress test to risk stratify

## 2017-06-04 NOTE — Assessment & Plan Note (Signed)
Patient essential hypertension blood pressure measured 130/90. She does check her blood pressure home and it is in this range or slightly higher on occasion associated with some headache and dizziness. Begin her on lisinopril 5 mg a day, check a basic metabolic panel in 2 weeks. She'll keep a blood pressure log over the next 30 days and will see Erasmo Downer back in the office 1 month to review her blood work and blood pressure readings and make appropriate titration.

## 2017-06-04 NOTE — Progress Notes (Addendum)
06/04/2017 JAYMEE TILSON   07/11/1966  [id number]  Primary Physician Irene Pap, NP Primary Cardiologist: Lorretta Harp MD Renae Gloss  HPI:  Cathy [name] is a very pleasant 51 year old mildly overweight divorced Caucasian female mother of 3 children who works as an Optometrist. She is self-referred since her mother and grandmother both see Dr. Claiborne Billings here in the office for evaluation of fairly recent onset dyspnea on exertion, atypical chest pain and dizziness. Her only risk factor is recently recognized hypertension currently on no medications. Her mother did have CAD but had no intervention. She apparently was in the 190 at 200 pound range back in 2015, had a partial hysterectomy December 2015 and subsequently lost 50 pounds. She's gained some of that back. Over the last 4-6 months she's noticed increasing dyspnea on exertion, atypical chest pain with right upper extremity radiation and occasional dizziness with headache. She also checks her blood pressure at home which has been running higher than usual. She is aware of soft restriction.   No current outpatient prescriptions on file.   No current facility-administered medications for this visit.     Allergies  Allergen Reactions  . Macrobid [Nitrofurantoin Monohyd Macro] Anaphylaxis    Social History   Social History  . Marital status: Legally Separated    Spouse name: N/A  . Number of children: 3  . Years of education: N/A   Occupational History  .  [employer], [place]   Social History Main Topics  . Smoking status: Never Smoker  . Smokeless tobacco: Never Used  . Alcohol use 0.5 oz/week    1 Standard drinks or equivalent per week     Comment: socially  . Drug use: No  . Sexual activity: Yes    Birth control/ protection: Pill   Other Topics Concern  . Not on file   Social History Narrative   Ms. Aurich is divorced and has partial custody of her children. She works FT as an Optometrist.        Review of Systems: General: negative for chills, fever, night sweats or weight changes.  Cardiovascular: negative for chest pain, dyspnea on exertion, edema, orthopnea, palpitations, paroxysmal nocturnal dyspnea or shortness of breath Dermatological: negative for rash Respiratory: negative for cough or wheezing Urologic: negative for hematuria Abdominal: negative for nausea, vomiting, diarrhea, bright red blood per rectum, melena, or hematemesis Neurologic: negative for visual changes, syncope, or dizziness All other systems reviewed and are otherwise negative except as noted above.    Blood pressure 132/90, pulse 70, height 5\' 1"  (1.549 m), weight 161 lb 12.8 oz (73.4 kg), last menstrual period 11/19/2014.  General appearance: alert and no distress Neck: no adenopathy, no carotid bruit, no JVD, supple, symmetrical, trachea midline and thyroid not enlarged, symmetric, no tenderness/mass/nodules Lungs: clear to auscultation bilaterally Heart: Soft outflow tract murmur Extremities: extremities normal, atraumatic, no cyanosis or edema  EKG not performed today, EKG performed at [facility] on 05/26/17 revealed sinus rhythm at 77 without ST or T-wave changes. I personally reviewed that EKG.  ASSESSMENT AND PLAN:   Atypical chest pain History of atypical chest pain over the last several months occurring randomly convalescing at that time with relation to right upper extremity. We will obtain a exercise Myoview stress test to risk stratify  Dyspnea on exertion History of dyspnea on exertion over the last several months. She has gained 20 pounds although in the past she lost 50 pounds or more. She does  have a soft outflow tract murmur. I'm going to obtain a 2-D echo to further evaluate  Essential hypertension Patient essential hypertension blood pressure measured 130/90. She does check her blood pressure home and it is in this range or slightly higher on occasion associated with some  headache and dizziness. Begin her on lisinopril 5 mg a day, check a basic metabolic panel in 2 weeks. She'll keep a blood pressure log over the next 30 days and will see Erasmo Downer back in the office 1 month to review her blood work and blood pressure readings and make appropriate titration.   Addendum: Because of symptoms compatible with obstructive sleep apnea and going to get an outpatient sleep study.   Lorretta Harp MD FACP,FACC,FAHA, [facility] 06/04/2017 8:15 AM

## 2017-06-10 ENCOUNTER — Telehealth: Payer: Self-pay | Admitting: Cardiovascular Disease

## 2017-06-10 NOTE — Telephone Encounter (Signed)
Returned the phone call to the patient. She stated that since taking the Lisinopril 5 mg, she has been feeling more tired and dizzy. She takes the Lisinopril in the afternoon and checks her blood pressure a few hours after that. Her readings are:  Today: 127/74 HR 106 Yesterday: 110/72 HR 74 Saturday: 96/47 Friday: 98/47 Thursday: 121/60 Wednesday: 131/76  She has not taken the Lisinopril on Sunday or today and states that she feels much better. The dizziness had ended but she still gets winded when walking from room to room and when exercising. Her heart rate tends to run around 106 and she states that she can feel it when reaches that point. Will route to the physician for further recommendation.

## 2017-06-10 NOTE — Telephone Encounter (Signed)
New message   Pt c/o medication issue:  1. Name of Medication: lisinopril (PRINIVIL,ZESTRIL) 5 MG tablet  2. How are you currently taking this medication (dosage and times per day)? Once a day    3. Are you having a reaction (difficulty breathing--STAT)? No   4. What is your medication issue? Heart rate goes up , blood pressure drop ,

## 2017-06-11 ENCOUNTER — Telehealth (HOSPITAL_COMMUNITY): Payer: Self-pay

## 2017-06-11 NOTE — Telephone Encounter (Signed)
Encounter complete. 

## 2017-06-16 NOTE — Telephone Encounter (Signed)
Please have her see Cathy [name] to evaluate

## 2017-06-18 ENCOUNTER — Ambulatory Visit (HOSPITAL_BASED_OUTPATIENT_CLINIC_OR_DEPARTMENT_OTHER): Payer: Managed Care, Other (non HMO)

## 2017-06-18 ENCOUNTER — Other Ambulatory Visit: Payer: Self-pay

## 2017-06-18 ENCOUNTER — Ambulatory Visit (HOSPITAL_COMMUNITY)
Admission: RE | Admit: 2017-06-18 | Discharge: 2017-06-18 | Disposition: A | Discharge status: Home / Self Care | Payer: Managed Care, Other (non HMO) | Source: Ambulatory Visit | Attending: Cardiovascular Disease | Admitting: Cardiovascular Disease

## 2017-06-18 ENCOUNTER — Other Ambulatory Visit: Payer: Managed Care, Other (non HMO)

## 2017-06-18 DIAGNOSIS — R079 Chest pain, unspecified: Secondary | ICD-10-CM | POA: Insufficient documentation

## 2017-06-18 DIAGNOSIS — R0609 Other forms of dyspnea: Secondary | ICD-10-CM | POA: Diagnosis present

## 2017-06-18 LAB — MYOCARDIAL PERFUSION IMAGING
CHL CUP NUCLEAR SSS: 2
CHL RATE OF PERCEIVED EXERTION: 18
CSEPEDS: 0 s
CSEPEW: 10.1 METS
Exercise duration (min): 9 min
LVDIAVOL: 91 mL (ref 46–106)
LVSYSVOL: 42 mL
MPHR: 171 {beats}/min
NUC STRESS TID: 1.07
Peak HR: 166 {beats}/min
Percent HR: 97 %
Rest HR: 61 {beats}/min
SDS: 1
SRS: 1

## 2017-06-18 LAB — ECHOCARDIOGRAM COMPLETE
Height: 61 in
Weight: 2576 oz

## 2017-06-18 MED ORDER — TECHNETIUM TC 99M TETROFOSMIN IV KIT
10.2000 | PACK | Freq: Once | INTRAVENOUS | Status: AC | PRN
  Administered 2017-06-18: 10.2 via INTRAVENOUS
  Filled 2017-06-18: qty 11

## 2017-06-18 MED ORDER — TECHNETIUM TC 99M TETROFOSMIN IV KIT
32.0000 | PACK | Freq: Once | INTRAVENOUS | Status: AC | PRN
  Administered 2017-06-18: 32 via INTRAVENOUS
  Filled 2017-06-18: qty 32

## 2017-06-18 NOTE — Telephone Encounter (Signed)
Message sent to scheduling 

## 2017-06-19 LAB — BASIC METABOLIC PANEL
BUN/Creatinine Ratio: 9 (ref 9–23)
BUN: 8 mg/dL (ref 6–24)
CO2: 25 mmol/L (ref 20–29)
CREATININE: 0.87 mg/dL (ref 0.57–1.00)
Calcium: 9.8 mg/dL (ref 8.7–10.2)
Chloride: 103 mmol/L (ref 96–106)
GFR, EST AFRICAN AMERICAN: 90 mL/min/{1.73_m2} (ref >59)
GFR, EST NON AFRICAN AMERICAN: 78 mL/min/{1.73_m2} (ref >59)
Glucose: 101 mg/dL — ABNORMAL HIGH (ref 65–99)
POTASSIUM: 4.5 mmol/L (ref 3.5–5.2)
SODIUM: 142 mmol/L (ref 134–144)

## 2017-06-19 LAB — HEPATIC FUNCTION PANEL
ALK PHOS: 55 IU/L (ref 39–117)
ALT: 8 IU/L (ref 0–32)
AST: 8 IU/L (ref 0–40)
Albumin: 4.6 g/dL (ref 3.5–5.5)
BILIRUBIN, DIRECT: 0.1 mg/dL (ref 0.00–0.40)
Bilirubin Total: 0.3 mg/dL (ref 0.0–1.2)
Total Protein: 7 g/dL (ref 6.0–8.5)

## 2017-06-19 LAB — LIPID PANEL
CHOL/HDL RATIO: 4.1 ratio (ref 0.0–4.4)
Cholesterol, Total: 207 mg/dL — ABNORMAL HIGH (ref 100–199)
HDL: 51 mg/dL (ref >39)
LDL Calculated: 129 mg/dL — ABNORMAL HIGH (ref 0–99)
Triglycerides: 133 mg/dL (ref 0–149)
VLDL CHOLESTEROL CAL: 27 mg/dL (ref 5–40)

## 2017-06-19 LAB — TSH: TSH: 2.33 u[IU]/mL (ref 0.450–4.500)

## 2017-06-19 LAB — T4, FREE: Free T4: 1.1 ng/dL (ref 0.82–1.77)

## 2017-06-21 ENCOUNTER — Other Ambulatory Visit: Payer: Self-pay | Admitting: *Deleted

## 2017-06-21 DIAGNOSIS — Z13220 Encounter for screening for lipoid disorders: Secondary | ICD-10-CM

## 2017-06-21 NOTE — Progress Notes (Unsigned)
Patient called with lab results.  Notes recorded by Lorretta Harp, MD on 06/19/2017 at 4:58 PM EDT Lipids moderately elevated for primary prevention. Encourage heart healthy diet and recheck in 3 months  Patient informed that lab orders will be mailed to her with instructions to come back in three months. She verbalized her understanding.

## 2017-06-24 ENCOUNTER — Telehealth: Payer: Self-pay | Admitting: Cardiovascular Disease

## 2017-06-24 NOTE — Telephone Encounter (Signed)
New message    Pt is returning call to [facility] about results.

## 2017-06-27 NOTE — Telephone Encounter (Signed)
[email or web address]

## 2017-06-27 NOTE — Telephone Encounter (Signed)
Appointment scheduled with PharmD on 7/12

## 2017-07-03 NOTE — Progress Notes (Signed)
Patient ID: Cathy [name]                 DOB: 18-Jun-1966                      MRN: [id number]     HPI: Cathy [name] is a 51 y.o. female referred by Dr. Gwenlyn [name] to HTN clinic. PMH includes dyspnea on exertion, atypical chest pain, hypertension and dizziness.  Lisinopril 5mg  daily was initiated durign office visit with Dr Cathy [name] on 06/04/2017 but patient reports she was unable to tolerate due to symptomatic hypotension. BMET repeat on 06/18/2017 show stable renal function with Scr at 0.87mg /dL and all electrolytes within normal limits. Patient presents to clinic for HTN follow up. Reports occasional palpitation, and fatigue. Denies dizziness, chest pain or swelling  Current HTN meds: none  Previously tried: Lisinopril 5mg  daily - stopped after after 2 doses  BP goal: <130/80  Social History: denies tobacco use; alcohol socially  Diet: no salt in diet, eat out a lot, cooks at home  Exercise: sedentary  Home BP readings: 15 readings; average 126/82 (pulse 67-137)  Wt Readings from Last 3 Encounters:  06/18/17 161 lb (73 kg)  06/04/17 161 lb 12.8 oz (73.4 kg)  05/31/15 139 lb (63 kg)   BP Readings from Last 3 Encounters:  07/04/17 122/80  06/04/17 132/90  05/31/15 129/81   Pulse Readings from Last 3 Encounters:  07/04/17 62  06/04/17 70  05/31/15 60    Past Medical History:  Diagnosis Date  . Depression   . Frequent headaches   . History of frequent urinary tract infections   . Juvenile epilepsy ([place])   . Migraines   . Seizures ([place])    none since 1987- classified as epileptic seizures    No current outpatient prescriptions on file prior to visit.   No current facility-administered medications on file prior to visit.     Allergies  Allergen Reactions  . Macrobid [Nitrofurantoin Monohyd Macro] Anaphylaxis    Blood pressure 122/80, pulse 62, last menstrual period 11/19/2014.  Essential hypertension Blood pressure today is well controlled. Noted fluctuation in home BP  readings with multiple episodes of tachycardia. HR fluctuates between 67 bpm to 137 bpm. Patient was not able to tolerate lisinopril to control her blood pressure and stopped the medication after only 2 doses. Will start metoprolol succinate at lower possible dose of 12.5mg  daily to improve tolerability. Plan to titrate metoprolol control HR and blood pressure.  Patient will follow up with Dr Cathy [name] in 3 weeks, then will schedule further follow up with HNT clinic as needed    Cathy [name] PharmD, [place] [address] [place] [address] 07/04/2017 8:16 PM

## 2017-07-04 ENCOUNTER — Ambulatory Visit (INDEPENDENT_AMBULATORY_CARE_PROVIDER_SITE_OTHER): Payer: Managed Care, Other (non HMO) | Admitting: Pharmacist

## 2017-07-04 VITALS — BP 122/80 | HR 62

## 2017-07-04 DIAGNOSIS — I10 Essential (primary) hypertension: Secondary | ICD-10-CM | POA: Diagnosis not present

## 2017-07-04 MED ORDER — METOPROLOL SUCCINATE ER 25 MG PO TB24: 12.5000 mg | ORAL_TABLET | Freq: Every day | ORAL | 0 refills | Status: DC

## 2017-07-04 NOTE — Patient Instructions (Addendum)
Return for a follow up appointment in as needed (follow up Dr Gwenlyn Found as previously scheduled)  Your blood pressure today is 122/80 pulse 62  Check your blood pressure at home daily (if able) and keep record of the readings.  Take your BP meds as follows: **START metoprolol succinate 12.5mg  daily**  Bring your BP cuff and your record of home blood pressures to your next appointment.  Exercise as you're able, try to walk approximately 30 minutes per day.  Keep salt intake to a minimum, especially watch canned and prepared boxed foods.  Eat more fresh fruits and vegetables and fewer canned items.  Avoid eating in fast food restaurants.    HOW TO TAKE YOUR BLOOD PRESSURE: . Rest 5 minutes before taking your blood pressure. .  Don't smoke or drink caffeinated beverages for at least 30 minutes before. . Take your blood pressure before (not after) you eat. . Sit comfortably with your back supported and both feet on the floor (don't cross your legs). . Elevate your arm to heart level on a table or a desk. . Use the proper sized cuff. It should fit smoothly and snugly around your bare upper arm. There should be enough room to slip a fingertip under the cuff. The bottom edge of the cuff should be 1 inch above the crease of the elbow. . Ideally, take 3 measurements at one sitting and record the average.

## 2017-07-04 NOTE — Assessment & Plan Note (Addendum)
Blood pressure today is well controlled. Noted fluctuation in home BP readings with multiple episodes of tachycardia. HR fluctuates between 67 bpm to 137 bpm. Patient was not able to tolerate lisinopril to control her blood pressure and stopped the medication after only 2 doses. Will start metoprolol succinate at lower possible dose of 12.5mg  daily to improve tolerability. Plan to titrate metoprolol control HR and blood pressure.  Patient will follow up with Dr Gwenlyn Found in 3 weeks, then will schedule further follow up with HNT clinic as needed

## 2017-07-23 ENCOUNTER — Encounter: Payer: Self-pay | Admitting: Cardiovascular Disease

## 2017-07-23 ENCOUNTER — Ambulatory Visit (INDEPENDENT_AMBULATORY_CARE_PROVIDER_SITE_OTHER): Payer: Managed Care, Other (non HMO) | Admitting: Cardiovascular Disease

## 2017-07-23 DIAGNOSIS — I10 Essential (primary) hypertension: Secondary | ICD-10-CM

## 2017-07-23 DIAGNOSIS — R0609 Other forms of dyspnea: Secondary | ICD-10-CM | POA: Diagnosis not present

## 2017-07-23 DIAGNOSIS — R0789 Other chest pain: Secondary | ICD-10-CM

## 2017-07-23 NOTE — Assessment & Plan Note (Signed)
History of dyspnea on exertion with recent 2-D echo performed 06/18/17 was normal except for grade 2 diastolic dysfunction.

## 2017-07-23 NOTE — Assessment & Plan Note (Signed)
History of atypical chest pain with recent Myoview stress test performed 06/18/17 that was entirely normal.

## 2017-07-23 NOTE — Assessment & Plan Note (Signed)
History of essential hypertension blood pressure measured 124/72. She is on low-dose metoprolol. Continue current meds at current dose

## 2017-07-23 NOTE — Progress Notes (Signed)
07/23/2017 Cathy [name]   Oct 06, 1966  [id number]  Primary Physician Irene Pap, NP Primary Cardiologist: Lorretta Harp MD Renae Gloss  HPI:  Cathy [name] is a 51 y.o. female  mildly overweight divorced Caucasian female mother of 3 children who works as an Optometrist. She was self-referred since her mother and grandmother both see Dr. Claiborne Billings here in the office for evaluation of fairly recent onset dyspnea on exertion, atypical chest pain and dizziness. I last saw her in the office 06/04/17 Her only risk factor is recently recognized hypertension currently on no medications. Her mother did have CAD but had no intervention. She apparently was in the 190 at 200 pound range back in 2015, had a partial hysterectomy December 2015 and subsequently lost 50 pounds. She's gained some of that back. Over the last 4-6 months she's noticed increasing dyspnea on exertion, atypical chest pain with right upper extremity radiation and occasional dizziness with headache. She also checks her blood pressure at home which has been running higher than usual. She is aware of salt restriction. She also recently saw her OB/GYN who did lab work that confirmed that she was in perimenopause. She had a 2-D echo and Myoview stress test performed on 06/18/17 which were essentially normal except for a 2 diastolic dysfunction. I did try her on low-dose lisinopril which resulted in excessively low blood pressures and worsening symptoms. She discontinue this.   Current Meds  Medication Sig  . metoprolol succinate (TOPROL-XL) 25 MG 24 hr tablet Take 0.5 tablets (12.5 mg total) by mouth daily. Take with or immediately following a meal.     Allergies  Allergen Reactions  . Macrobid [Nitrofurantoin Monohyd Macro] Anaphylaxis    Social History   Social History  . Marital status: Legally Separated    Spouse name: N/A  . Number of children: 3  . Years of education: N/A   Occupational History  .  [employer], [employer]   Social History Main Topics  . Smoking status: Never Smoker  . Smokeless tobacco: Never Used  . Alcohol use 0.5 oz/week    1 Standard drinks or equivalent per week     Comment: socially  . Drug use: No  . Sexual activity: Yes    Birth control/ protection: Pill   Other Topics Concern  . Not on file   Social History Narrative   Cathy [name] is divorced and has partial custody of her children. She works FT as an Optometrist.      Review of Systems: General: negative for chills, fever, night sweats or weight changes.  Cardiovascular: negative for chest pain, dyspnea on exertion, edema, orthopnea, palpitations, paroxysmal nocturnal dyspnea or shortness of breath Dermatological: negative for rash Respiratory: negative for cough or wheezing Urologic: negative for hematuria Abdominal: negative for nausea, vomiting, diarrhea, bright red blood per rectum, melena, or hematemesis Neurologic: negative for visual changes, syncope, or dizziness All other systems reviewed and are otherwise negative except as noted above.    Blood pressure 124/72, pulse 60, height 5\' 1"  (1.549 m), weight 160 lb (72.6 kg), last menstrual period 11/19/2014.  General appearance: alert and no distress Neck: no adenopathy, no carotid bruit, no JVD, supple, symmetrical, trachea midline and thyroid not enlarged, symmetric, no tenderness/mass/nodules Lungs: clear to auscultation bilaterally Heart: regular rate and rhythm, S1, S2 normal, no murmur, click, rub or gallop Extremities: extremities normal, atraumatic, no cyanosis or edema  EKG not performed today  ASSESSMENT AND  PLAN:   Atypical chest pain History of atypical chest pain with recent Myoview stress test performed 06/18/17 that was entirely normal.  Dyspnea on exertion History of dyspnea on exertion with recent 2-D echo performed 06/18/17 was normal except for grade 2 diastolic dysfunction.  Essential hypertension History of  essential hypertension blood pressure measured 124/72. She is on low-dose metoprolol. Continue current meds at current dose      Lorretta Harp MD [facility], [facility] 07/23/2017 9:24 AM

## 2017-07-23 NOTE — Patient Instructions (Signed)
We request that you follow-up in: 6 months with an extender and in 12 months with Dr Berry  You will receive a reminder letter in the mail two months in advance. If you don't receive a letter, please call our office to schedule the follow-up appointment.   

## 2017-07-30 ENCOUNTER — Other Ambulatory Visit: Payer: Self-pay | Admitting: *Deleted

## 2017-07-30 DIAGNOSIS — I10 Essential (primary) hypertension: Secondary | ICD-10-CM

## 2017-07-30 DIAGNOSIS — R0609 Other forms of dyspnea: Principal | ICD-10-CM

## 2017-07-31 ENCOUNTER — Telehealth: Payer: Self-pay | Admitting: *Deleted

## 2017-07-31 NOTE — Telephone Encounter (Signed)
In lab sleep study denied, HST ordered, scheduled for Monday 8/20 at 12pm.     Called patient, left message to call back.

## 2017-08-01 ENCOUNTER — Encounter (HOSPITAL_BASED_OUTPATIENT_CLINIC_OR_DEPARTMENT_OTHER): Payer: Self-pay

## 2017-08-08 NOTE — Telephone Encounter (Signed)
Called and left detailed message (ok per DPR) to inform patient HST has been denied by insurance at this time, Dr. Gwenlyn Found has been made aware.    Advised to call with further questions or concerns.

## 2017-08-09 ENCOUNTER — Telehealth: Payer: Self-pay | Admitting: Cardiovascular Disease

## 2017-08-09 NOTE — Telephone Encounter (Signed)
Patient aware both in lab and HST have been denied by insurance, Dr. Gwenlyn Found has been made aware.   Patient verbalized understanding.

## 2017-08-09 NOTE — Telephone Encounter (Signed)
New message    Pt is calling asking for a call back. About sleep study.

## 2017-08-09 NOTE — Telephone Encounter (Signed)
New message    Pt is calling about her sleep study. Please call. She is returning call from yesterday.

## 2017-08-12 ENCOUNTER — Encounter (HOSPITAL_BASED_OUTPATIENT_CLINIC_OR_DEPARTMENT_OTHER): Payer: Self-pay

## 2017-09-27 ENCOUNTER — Ambulatory Visit (INDEPENDENT_AMBULATORY_CARE_PROVIDER_SITE_OTHER): Payer: Managed Care, Other (non HMO) | Admitting: Family Medicine

## 2017-09-27 ENCOUNTER — Encounter: Payer: Self-pay | Admitting: Family Medicine

## 2017-09-27 VITALS — BP 106/71 | HR 68 | Temp 98.0°F | Resp 20 | Ht 61.0 in | Wt 163.8 lb

## 2017-09-27 DIAGNOSIS — K649 Unspecified hemorrhoids: Secondary | ICD-10-CM | POA: Diagnosis not present

## 2017-09-27 DIAGNOSIS — R102 Pelvic and perineal pain: Secondary | ICD-10-CM | POA: Diagnosis not present

## 2017-09-27 DIAGNOSIS — Z8041 Family history of malignant neoplasm of ovary: Secondary | ICD-10-CM | POA: Diagnosis not present

## 2017-09-27 DIAGNOSIS — R350 Frequency of micturition: Secondary | ICD-10-CM | POA: Insufficient documentation

## 2017-09-27 DIAGNOSIS — R195 Other fecal abnormalities: Secondary | ICD-10-CM

## 2017-09-27 DIAGNOSIS — R1084 Generalized abdominal pain: Secondary | ICD-10-CM | POA: Diagnosis not present

## 2017-09-27 LAB — POC URINALSYSI DIPSTICK (AUTOMATED)
BILIRUBIN UA: NEGATIVE
Blood, UA: NEGATIVE
Glucose, UA: NEGATIVE
Ketones, UA: NEGATIVE
LEUKOCYTES UA: NEGATIVE
NITRITE UA: NEGATIVE
PH UA: 5.5 (ref 5.0–8.0)
PROTEIN UA: NEGATIVE
Spec Grav, UA: 1.015 (ref 1.010–1.025)
Urobilinogen, UA: 0.2 E.U./dL

## 2017-09-27 MED ORDER — HYDROCORTISONE ACETATE 25 MG RE SUPP: 25.0000 mg | Freq: Two times a day (BID) | RECTAL | 0 refills | Status: DC

## 2017-09-27 MED ORDER — NAPROXEN 500 MG PO TABS: 500.0000 mg | ORAL_TABLET | Freq: Two times a day (BID) | ORAL | 0 refills | Status: DC

## 2017-09-27 MED ORDER — HYDROCORTISONE 2.5 % RE CREA: 1.0000 "application " | TOPICAL_CREAM | Freq: Two times a day (BID) | RECTAL | 0 refills | Status: DC

## 2017-09-27 NOTE — Patient Instructions (Signed)
Start naproxen for pain, every 12 hours with food.  We will call you to schedule CT and give results of labs.  Hydrate, flush the kidneys  Cream and suppository for hemorrhoid.    It was a pleasure to meet you today.    Please help [place] help you:  We are honored you have chosen [facility] for your Primary Care home. Below you will find basic instructions that you may need to access in the future. Please help [place] help you by reading the instructions, which cover many of the frequent questions we experience.   Prescription refills and request:  -In order to allow more efficient response time, please call your pharmacy for all refills. They will forward the request electronically to [place]. This allows for the quickest possible response. Request left on a nurse line can take longer to refill, since these are checked as time allows between office patients and other phone calls.  - refill request can take up to 3-5 working days to complete.  - If request is sent electronically and request is appropiate, it is usually completed in 1-2 business days.  - all patients will need to be seen routinely for all chronic medical conditions requiring prescription medications (see follow-up below). If you are overdue for follow up on your condition, you will be asked to make an appointment and we will call in enough medication to cover you until your appointment (up to 30 days).  - all controlled substances will require a face to face visit to request/refill.  - if you desire your prescriptions to go through a new pharmacy, and have an active script at original pharmacy, you will need to call your pharmacy and have scripts transferred to new pharmacy. This is completed between the pharmacy locations and not by your provider.    Results: If any images or labs were ordered, it can take up to 1 week to get results depending on the test ordered and the lab/facility running and resulting the test. - Normal or stable  results, which do not need further discussion, may be released to your mychart immediately with attached note to you. A call may not be generated for normal results. Please make certain to sign up for mychart. If you have questions on how to activate your mychart you can call the front office.  - If your results need further discussion, our office will attempt to contact you via phone, and if unable to reach you after 2 attempts, we will release your abnormal result to your mychart with instructions.  - All results will be automatically released in mychart after 1 week.  - Your provider will provide you with explanation and instruction on all relevant material in your results. Please keep in mind, results and labs may appear confusing or abnormal to the untrained eye, but it does not mean they are actually abnormal for you personally. If you have any questions about your results that are not covered, or you desire more detailed explanation than what was provided, you should make an appointment with your provider to do so.   Our office handles many outgoing and incoming calls daily. If we have not contacted you within 1 week about your results, please check your mychart to see if there is a message first and if not, then contact our office.  In helping with this matter, you help decrease call volume, and therefore allow [place] to be able to respond to patients needs more efficiently.  Acute office visits (sick visit):  An acute visit is intended for a new problem and are scheduled in shorter time slots to allow schedule openings for patients with new problems. This is the appropriate visit to discuss a new problem. In order to provide you with excellent quality medical care with proper time for you to explain your problem, have an exam and receive treatment with instructions, these appointments should be limited to one new problem per visit. If you experience a new problem, in which you desire to be addressed,  please make an acute office visit, we save openings on the schedule to accommodate you. Please do not save your new problem for any other type of visit, let us take care of it properly and quickly for you.   Follow up visits:  Depending on your condition(s) your provider will need to see you routinely in order to provide you with quality care and prescribe medication(s). Most chronic conditions (Example: hypertension, Diabetes, depression/anxiety... etc), require visits a couple times a year. Your provider will instruct you on proper follow up for your personal medical conditions and history. Please make certain to make follow up appointments for your condition as instructed. Failing to do so could result in lapse in your medication treatment/refills. If you request a refill, and are overdue to be seen on a condition, we will always provide you with a 30 day script (once) to allow you time to schedule.    Medicare wellness (well visit): - we have a wonderful Nurse Maudie Mercury), that will meet with you and provide you will yearly medicare wellness visits. These visits should occur yearly (can not be scheduled less than 1 calendar year apart) and cover preventive health, immunizations, advance directives and screenings you are entitled to yearly through your medicare benefits. Do not miss out on your entitled benefits, this is when medicare will pay for these benefits to be ordered for you.  These are strongly encouraged by your provider and is the appropriate type of visit to make certain you are up to date with all preventive health benefits. If you have not had your medicare wellness exam in the last 12 months, please make certain to schedule one by calling the office and schedule your medicare wellness with Maudie Mercury as soon as possible.   Yearly physical (well visit):  - Adults are recommended to be seen yearly for physicals. Check with your insurance and date of your last physical, most insurances require one  calendar year between physicals. Physicals include all preventive health topics, screenings, medical exam and labs that are appropriate for gender/age and history. You may have fasting labs needed at this visit. This is a well visit (not a sick visit), new problems should not be covered during this visit (see acute visit).  - Pediatric patients are seen more frequently when they are younger. Your provider will advise you on well child visit timing that is appropriate for your their age. - This is not a medicare wellness visit. Medicare wellness exams do not have an exam portion to the visit. Some medicare companies allow for a physical, some do not allow a yearly physical. If your medicare allows a yearly physical you can schedule the medicare wellness with our nurse Maudie Mercury and have your physical with your provider after, on the same day. Please check with insurance for your full benefits.   Late Policy/No Shows:  - all new patients should arrive 15-30 minutes earlier than appointment to allow [place] time  to  obtain all personal demographics,  insurance information and for you to complete office paperwork. - All established patients should arrive 10-15 minutes earlier than appointment time to update all information and be checked in .  - In our best efforts to run on time, if you are late for your appointment you will be asked to either reschedule or if able, we will work you back into the schedule. There will be a wait time to work you back in the schedule,  depending on availability.  - If you are unable to make it to your appointment as scheduled, please call 24 hours ahead of time to allow [place] to fill the time slot with someone else who needs to be seen. If you do not cancel your appointment ahead of time, you may be charged a no show fee.

## 2017-09-27 NOTE — Progress Notes (Signed)
Cathy [name] , 1966/02/17, 51 y.o., female MRN: [id number] Patient Care Team    Relationship Specialty Notifications Start End  Ma Hillock, DO PCP - General Family Medicine  09/27/17   Lorretta Harp, MD Consulting Physician Cardiology  09/27/17   Paula Compton, MD Consulting Physician Obstetrics and Gynecology  09/27/17     Chief Complaint  Patient presents with  . Back Pain    urinary freq x 3 weeks     Subjective: Pt presents for an OV with complaints of Urinary frequency of 3 weeks duration.  Associated symptoms include pain left suprapubic area and left flank. Patient endorses nausea, fatigue and diarrhea. She has a known hemorrhoid that is also in a flare. The pain is worse when straining both for urine and bowel. She denies current fever or chills. She endorses her normal urinary output. Patient has a history of hysterectomy, but still has her ovaries. Pt has tried Advil and heating pad to ease their symptoms. Mother with a history of ovarian cancer.  Depression screen PHQ 2/9 09/27/2017  Decreased Interest 0  Down, Depressed, Hopeless 1  PHQ - 2 Score 1    Allergies  Allergen Reactions  . Macrobid [Nitrofurantoin Monohyd Macro] Anaphylaxis   Social History  Substance Use Topics  . Smoking status: Never Smoker  . Smokeless tobacco: Never Used  . Alcohol use 0.5 oz/week    1 Standard drinks or equivalent per week     Comment: socially   Past Medical History:  Diagnosis Date  . Depression   . Dyspnea on exertion    With atypical chest pain, followed by cardiology.  . Genital warts   . History of frequent urinary tract infections   . Juvenile epilepsy ([place])   . Migraines   . Seizures ([place])    none since 1987- classified as epileptic seizures  . Thrombosed external hemorrhoid    Past Surgical History:  Procedure Laterality Date  . BILATERAL SALPINGECTOMY Bilateral 12/20/2014   Procedure: BILATERAL SALPINGECTOMY;  Surgeon: Logan Bores, MD;   Location: [facility];  Service: Gynecology;  Laterality: Bilateral;  . HEMORRHOID SURGERY     thrombosed hemorrhoid  . LAPAROSCOPIC ASSISTED VAGINAL HYSTERECTOMY N/A 12/20/2014   Procedure: LAPAROSCOPIC ASSISTED VAGINAL HYSTERECTOMY;  Surgeon: Logan Bores, MD;  Location: [facility];  Service: Gynecology;  Laterality: N/A;  . tendon repair Right 2011   right ankle  . TONSILLECTOMY  1973   Family History  Problem Relation Age of Onset  . Hyperlipidemia Mother   . Heart disease Mother   . Hypertension Mother   . Ovarian cancer Mother   . Cervical cancer Mother   . Heart attack Mother   . Depression Sister   . Asthma Sister   . Asthma Son   . Dementia Maternal Grandmother   . Heart attack Maternal Grandmother   . Heart attack Maternal Grandfather   . Heart attack Paternal Grandmother    Allergies as of 09/27/2017      Reactions   Macrobid [nitrofurantoin Monohyd Macro] Anaphylaxis      Medication List       Accurate as of 09/27/17 11:59 PM. Always use your most recent med list.          hydrocortisone 2.5 % rectal cream Commonly known as:  ANUSOL-HC Place 1 application rectally 2 (two) times daily.   hydrocortisone 25 MG suppository Commonly known as:  ANUSOL-HC Place 1 suppository (25 mg total) rectally 2 (two) times daily.  metoprolol succinate 25 MG 24 hr tablet Commonly known as:  TOPROL-XL Take 0.5 tablets (12.5 mg total) by mouth daily. Take with or immediately following a meal.   naproxen 500 MG tablet Commonly known as:  NAPROSYN Take 1 tablet (500 mg total) by mouth 2 (two) times daily with a meal.       All past medical history, surgical history, allergies, family history, immunizations andmedications were updated in the EMR today and reviewed under the history and medication portions of their EMR.     ROS: Negative, with the exception of above mentioned in HPI   Objective:  BP 106/71 (BP Location: Left Arm, Patient Position: Sitting, Cuff Size:  Normal)   Pulse 68   Temp 98 F (36.7 C)   Resp 20   Ht 5\' 1"  (1.549 m)   Wt 163 lb 12 oz (74.3 kg)   LMP 11/19/2014   SpO2 97%   BMI 30.94 kg/m  Body mass index is 30.94 kg/m. Gen: Afebrile. No acute distress. Nontoxic in appearance, well developed, well nourished. Pleasant Caucasian female. HENT: AT. [place]. MMM, no oral lesions.  Eyes:Pupils Equal Round Reactive to light, Extraocular movements intact,  Conjunctiva without redness, discharge or icterus. Neck/lymp/endocrine: Supple, no lymphadenopathy CV: RRR, no edema Chest: CTAB, no wheeze or crackles.   Abd: Soft. Mildly obese. Moderate tenderness left lower quadrant and left suprapubic. Nondistended. Bowel sounds present. No masses palpated. No rebound or guarding. Negative McBurney's tenderness, negative Murphy's sign.  Neuro:  Normal gait. PERLA. EOMi. Alert. Oriented x3  Psych: Normal affect, dress and demeanor. Normal speech. Normal thought content and judgment.  No exam data present No results found. No results found for this or any previous visit (from the past 24 hour(s)).  Assessment/Plan: Cathy [name] is a 51 y.o. female present for OV for  Urinary frequency/pelvic pain/abdominal pain/family history ovarian cancer Hemorrhoids, unspecified hemorrhoid type Loose stools - Patient with greater than 3 weeks history of abdominal pain that is worsening along her left lower abdomen and flank. Urine studies today do not appear Infectious. Will send to culture to be complete. She is rather exquisitely tender in her left deep lower quadrant. No masses palpated. Concern given her history of loose stools and also her family history of ovarian cancer. - Rest, hydrate. Patient okay with using over-the-counter NSAIDs for discomfort. - Prescription for hemorrhoid cream and suppositories. - POCT Urinalysis Dipstick (Automated) - CBC w/Diff - Basic Metabolic Panel (BMET) - Urine Culture - CT Abdomen Pelvis W Contrast; Future -  Follow-up depending upon lab studies and imaging.   Reviewed expectations re: course of current medical issues.  Discussed self-management of symptoms.  Outlined signs and symptoms indicating need for more acute intervention.  Patient verbalized understanding and all questions were answered.  Patient received an After-Visit Summary.    Orders Placed This Encounter  Procedures  . Urine Culture  . CT Abdomen Pelvis W Contrast  . CBC w/Diff  . Basic Metabolic Panel (BMET)  . POCT Urinalysis Dipstick (Automated)     Note is dictated utilizing voice recognition software. Although note has been proof read prior to signing, occasional typographical errors still can be missed. If any questions arise, please do not hesitate to call for verification.   electronically signed by:  Howard Pouch, DO  [place]

## 2017-09-28 LAB — URINE CULTURE
MICRO NUMBER:: 81112838
SPECIMEN QUALITY: ADEQUATE

## 2017-09-28 LAB — CBC WITH DIFFERENTIAL/PLATELET
BASOS ABS: 37 {cells}/uL (ref 0–200)
Basophils Relative: 0.5 %
EOS PCT: 2.7 %
Eosinophils Absolute: 200 cells/uL (ref 15–500)
HEMATOCRIT: 37.9 % (ref 35.0–45.0)
Hemoglobin: 13 g/dL (ref 11.7–15.5)
Lymphs Abs: 2338 cells/uL (ref 850–3900)
MCH: 31.9 pg (ref 27.0–33.0)
MCHC: 34.3 g/dL (ref 32.0–36.0)
MCV: 92.9 fL (ref 80.0–100.0)
MONOS PCT: 7.3 %
MPV: 11.9 fL (ref 7.5–12.5)
NEUTROS PCT: 57.9 %
Neutro Abs: 4285 cells/uL (ref 1500–7800)
PLATELETS: 249 10*3/uL (ref 140–400)
RBC: 4.08 10*6/uL (ref 3.80–5.10)
RDW: 12.3 % (ref 11.0–15.0)
TOTAL LYMPHOCYTE: 31.6 %
WBC mixed population: 540 cells/uL (ref 200–950)
WBC: 7.4 10*3/uL (ref 3.8–10.8)

## 2017-09-28 LAB — BASIC METABOLIC PANEL
BUN: 10 mg/dL (ref 7–25)
CALCIUM: 9.2 mg/dL (ref 8.6–10.4)
CO2: 27 mmol/L (ref 20–32)
Chloride: 104 mmol/L (ref 98–110)
Creat: 0.93 mg/dL (ref 0.50–1.05)
Glucose, Bld: 83 mg/dL (ref 65–99)
POTASSIUM: 4.4 mmol/L (ref 3.5–5.3)
Sodium: 139 mmol/L (ref 135–146)

## 2017-09-30 DIAGNOSIS — Z8041 Family history of malignant neoplasm of ovary: Secondary | ICD-10-CM | POA: Insufficient documentation

## 2017-10-05 ENCOUNTER — Encounter (HOSPITAL_BASED_OUTPATIENT_CLINIC_OR_DEPARTMENT_OTHER): Payer: Self-pay

## 2017-10-05 ENCOUNTER — Ambulatory Visit (HOSPITAL_BASED_OUTPATIENT_CLINIC_OR_DEPARTMENT_OTHER)
Admission: RE | Admit: 2017-10-05 | Discharge: 2017-10-05 | Disposition: A | Discharge status: Home / Self Care | Payer: Managed Care, Other (non HMO) | Source: Ambulatory Visit | Attending: Family Medicine | Admitting: Family Medicine

## 2017-10-05 DIAGNOSIS — R350 Frequency of micturition: Secondary | ICD-10-CM | POA: Insufficient documentation

## 2017-10-05 DIAGNOSIS — Z8041 Family history of malignant neoplasm of ovary: Secondary | ICD-10-CM | POA: Diagnosis not present

## 2017-10-05 DIAGNOSIS — R102 Pelvic and perineal pain: Secondary | ICD-10-CM

## 2017-10-05 DIAGNOSIS — R1084 Generalized abdominal pain: Secondary | ICD-10-CM | POA: Diagnosis present

## 2017-10-05 DIAGNOSIS — N83201 Unspecified ovarian cyst, right side: Secondary | ICD-10-CM | POA: Insufficient documentation

## 2017-10-05 DIAGNOSIS — R195 Other fecal abnormalities: Secondary | ICD-10-CM | POA: Diagnosis not present

## 2017-10-05 MED ORDER — IOPAMIDOL (ISOVUE-300) INJECTION 61%
100.0000 mL | Freq: Once | INTRAVENOUS | Status: AC | PRN
  Administered 2017-10-05: 100 mL via INTRAVENOUS

## 2017-10-17 ENCOUNTER — Encounter: Payer: Self-pay | Admitting: *Deleted

## 2017-10-21 ENCOUNTER — Encounter: Payer: Self-pay | Admitting: Family Medicine

## 2017-11-13 IMAGING — NM NM MISC PROCEDURE
9 series · 54 of 54 positions shown · non-contrast
Comparison: none

[Series 1: wbr_r-proj_st wbr rest · 6.40mm/px · 6 of 64 frames shown]
[frame 6/64]
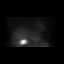
[frame 16/64]
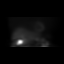
[frame 27/64]
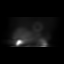
[frame 38/64]
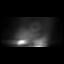
[frame 48/64]
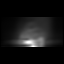
[frame 59/64]
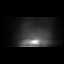

[Series 1: rest sax · 6.4mm · 6.40mm/px · 6 of 19 frames shown]
[frame 2/19]
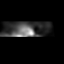
[frame 5/19]
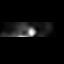
[frame 8/19]
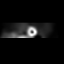
[frame 11/19]
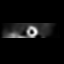
[frame 14/19]
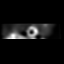
[frame 18/19]
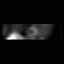

[Series 1: wbr rest · 6.40mm/px · 6 of 64 frames shown]
[frame 6/64]
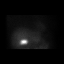
[frame 16/64]
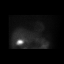
[frame 27/64]
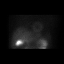
[frame 38/64]
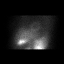
[frame 48/64]
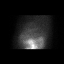
[frame 59/64]
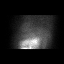

[Series 2: stress sax gs · 6.4mm · 6.40mm/px · 6 of 160 frames shown]
[frame 14/160]
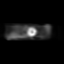
[frame 40/160]
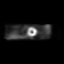
[frame 67/160]
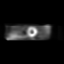
[frame 94/160]
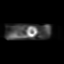
[frame 120/160]
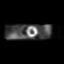
[frame 147/160]
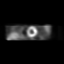

[Series 2: stress sax · 6.4mm · 6.40mm/px · 6 of 20 frames shown]
[frame 2/20]
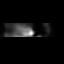
[frame 5/20]
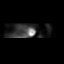
[frame 9/20]
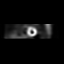
[frame 12/20]
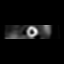
[frame 15/20]
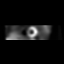
[frame 19/20]
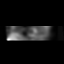

[Series 2: wbr_s-proj_st wbr stress-gsp · 6.40mm/px · 6 of 512 frames shown]
[frame 43/512]
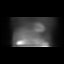
[frame 128/512]
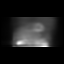
[frame 214/512]
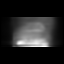
[frame 299/512]
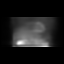
[frame 384/512]
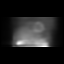
[frame 470/512]
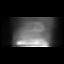

[Series 2: wbr stress-gsp · 6.40mm/px · 6 of 512 frames shown]
[frame 43/512]
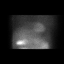
[frame 128/512]
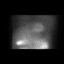
[frame 214/512]
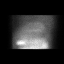
[frame 299/512]
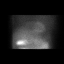
[frame 384/512]
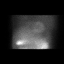
[frame 470/512]
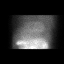

[Series 3: wbr stress-sum-em · 6.40mm/px · 6 of 64 frames shown]
[frame 6/64]
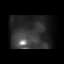
[frame 16/64]
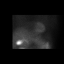
[frame 27/64]
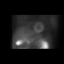
[frame 38/64]
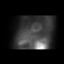
[frame 48/64]
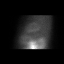
[frame 59/64]
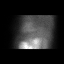

[Series 3: wbr_s-proj_st wbr stress-sum-em · 6.40mm/px · 6 of 64 frames shown]
[frame 6/64]
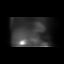
[frame 16/64]
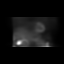
[frame 27/64]
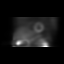
[frame 38/64]
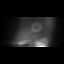
[frame 48/64]
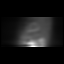
[frame 59/64]
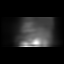

[54 of 54 positions shown; findings below may reference images not displayed]

Canned report from images found in remote index.

Refer to host system for actual result text.

## 2018-06-25 ENCOUNTER — Encounter: Payer: Self-pay | Admitting: Family Medicine

## 2018-06-25 LAB — HM MAMMOGRAPHY

## 2018-06-25 LAB — HM PAP SMEAR

## 2018-08-26 ENCOUNTER — Ambulatory Visit (INDEPENDENT_AMBULATORY_CARE_PROVIDER_SITE_OTHER): Payer: Self-pay | Admitting: Family Medicine

## 2018-08-26 ENCOUNTER — Encounter: Payer: Self-pay | Admitting: Family Medicine

## 2018-08-26 VITALS — BP 118/72 | HR 80 | Temp 98.7°F | Resp 20 | Ht 61.0 in | Wt 167.0 lb

## 2018-08-26 DIAGNOSIS — E669 Obesity, unspecified: Secondary | ICD-10-CM | POA: Insufficient documentation

## 2018-08-26 DIAGNOSIS — Z131 Encounter for screening for diabetes mellitus: Secondary | ICD-10-CM

## 2018-08-26 DIAGNOSIS — Z130 Encounter for screening for diseases of the blood and blood-forming organs and certain disorders involving the immune mechanism: Secondary | ICD-10-CM

## 2018-08-26 DIAGNOSIS — Z1211 Encounter for screening for malignant neoplasm of colon: Secondary | ICD-10-CM

## 2018-08-26 DIAGNOSIS — Z23 Encounter for immunization: Secondary | ICD-10-CM

## 2018-08-26 DIAGNOSIS — E7800 Pure hypercholesterolemia, unspecified: Secondary | ICD-10-CM

## 2018-08-26 DIAGNOSIS — Z0000 Encounter for general adult medical examination without abnormal findings: Secondary | ICD-10-CM

## 2018-08-26 NOTE — Patient Instructions (Signed)

## 2018-08-26 NOTE — Progress Notes (Signed)
Patient ID: Cathy [name], female  DOB: 08/21/1966, 52 y.o.   MRN: [id number] Patient Care Team    Relationship Specialty Notifications Start End  Ma Hillock, DO PCP - General Family Medicine  10/07/17   Lorretta Harp, MD Consulting Physician Cardiology  09/27/17   Paula Compton, MD Consulting Physician Obstetrics and Gynecology  09/27/17     Chief Complaint  Patient presents with  . Annual Exam    Subjective:  Cathy [name] is a 52 y.o.  Female  present for CPE. All past medical history, surgical history, allergies, family history, immunizations, medications and social history were updated in the electronic medical record today. All recent labs, ED visits and hospitalizations within the last year were reviewed.  Health maintenance:  Colonoscopy: no Fhx. Screen is over due. Referral placed today.  Mammogram: completed:06/2018, birads 1.  Cervical cancer screening: last pelvic: 06/2018, results, completed by: GYN. Dr Marvel Plan Immunizations: tdap UTD 2017, Influenza administered today (encouraged yearly) Infectious disease screening: HIV completed  DEXA: screen ~60- 65 Assistive device: none Oxygen SWN:IOEV Patient has a Dental home. Hospitalizations/ED visits: reviewed  Depression screen [facility] 2/9 08/26/2018 09/27/2017  Decreased Interest 0 0  Down, Depressed, Hopeless 0 1  PHQ - 2 Score 0 1   No flowsheet data found.   Current Exercise Habits: Home exercise routine, Type of exercise: walking, Time (Minutes): 30, Frequency (Times/Week): 3, Weekly Exercise (Minutes/Week): 90, Intensity: Mild    Immunization History  Administered Date(s) Administered  . Influenza,inj,Quad PF,6+ Mos 08/26/2018  . Influenza-Unspecified 11/15/2014  . Tdap 10/12/2010, 04/12/2016   Past Medical History:  Diagnosis Date  . Depression   . Dyspnea on exertion    With atypical chest pain, followed by cardiology.  . Genital warts 02/5008   TCA application by GYn  . History of  frequent urinary tract infections   . Juvenile epilepsy ([place])   . Migraines   . Seizures ([place])    none since 1987- classified as epileptic seizures  . Thrombosed external hemorrhoid    Allergies  Allergen Reactions  . Macrobid [Nitrofurantoin Monohyd Macro] Anaphylaxis   Past Surgical History:  Procedure Laterality Date  . BILATERAL SALPINGECTOMY Bilateral 12/20/2014   Procedure: BILATERAL SALPINGECTOMY;  Surgeon: Logan Bores, MD;  Location: [facility];  Service: Gynecology;  Laterality: Bilateral;  . HEMORRHOID SURGERY     thrombosed hemorrhoid  . LAPAROSCOPIC ASSISTED VAGINAL HYSTERECTOMY N/A 12/20/2014   Procedure: LAPAROSCOPIC ASSISTED VAGINAL HYSTERECTOMY;  Surgeon: Logan Bores, MD;  Location: [facility];  Service: Gynecology;  Laterality: N/A;  . tendon repair Right 2011   right ankle  . TONSILLECTOMY  1973   Family History  Problem Relation Age of Onset  . Hyperlipidemia Mother   . Heart disease Mother   . Hypertension Mother   . Ovarian cancer Mother   . Cervical cancer Mother   . Heart attack Mother   . Depression Sister   . Asthma Sister   . Asthma Son   . Dementia Maternal Grandmother   . Heart attack Maternal Grandmother   . Heart attack Maternal Grandfather   . Heart attack Paternal Grandmother    Social History   Socioeconomic History  . Marital status: Divorced    Spouse name: Not on file  . Number of children: 3  . Years of education: 64  . Highest education level: Not on file  Occupational History  . 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 Financial resource strain: Not on file  . Food insecurity:    Worry: Not on file    Inability: Not on file  . Transportation needs:    Medical: Not on file    Non-medical: Not on file  Tobacco Use  . Smoking status: Never Smoker  . Smokeless tobacco: Never Used  Substance and Sexual Activity  . Alcohol use: Yes    Alcohol/week: 1.0 standard drinks     Types: 1 Standard drinks or equivalent per week    Comment: socially  . Drug use: No  . Sexual activity: Yes    Partners: Male    Birth control/protection: Pill  Lifestyle  . Physical activity:    Days per week: Not on file    Minutes per session: Not on file  . Stress: Not on file  Relationships  . Social connections:    Talks on phone: Not on file    Gets together: Not on file    Attends religious service: Not on file    Active member of club or organization: Not on file    Attends meetings of clubs or organizations: Not on file    Relationship status: Not on file  . Intimate partner violence:    Fear of current or ex partner: Not on file    Emotionally abused: Not on file    Physically abused: Not on file    Forced sexual activity: Not on file  Other Topics Concern  . Not on file  Social History Narrative   Divorced. 3 children. Bachelors degree. Works as an Optometrist.   Exercises routinely.   Wears her seatbelt. Wears a bicycle helmet. Smoke detector in the home.   Feels safe in her relationships.   Allergies as of 08/26/2018      Reactions   Macrobid [nitrofurantoin Monohyd Macro] Anaphylaxis      Medication List    as of 08/26/2018  2:45 PM   You have not been prescribed any medications.     All past medical history, surgical history, allergies, family history, immunizations andmedications were updated in the EMR today and reviewed under the history and medication portions of their EMR.     Recent Results (from the past 2160 hour(s))  HM MAMMOGRAPHY     Status: None   Collection Time: 06/25/18 12:00 AM  Result Value Ref Range   HM Mammogram 0-4 Bi-Rad 0-4 Bi-Rad, Self Reported Normal  HM PAP SMEAR     Status: None   Collection Time: 06/25/18 12:00 AM  Result Value Ref Range   HM Pap smear WNL     ROS: 14 pt review of systems performed and negative (unless mentioned in an HPI)  Objective: BP 118/72 (BP Location: Right Arm, Patient Position: Sitting, Cuff  Size: Large)   Pulse 80   Temp 98.7 F (37.1 C)   Resp 20   Ht '5\' 1"'  (1.549 m)   Wt 167 lb (75.8 kg)   LMP 11/19/2014   SpO2 97%   BMI 31.55 kg/m  Gen: Afebrile. No acute distress. Nontoxic in appearance, well-developed, well-nourished,  Pleasant caucasian female.  HENT: AT. [place]. Bilateral TM visualized and normal in appearance, normal external auditory canal. MMM, no oral lesions, adequate dentition. Bilateral nares within normal limits. Throat without erythema, ulcerations or exudates. no Cough on exam, no hoarseness on exam. Eyes:Pupils Equal Round Reactive to light, Extraocular movements intact,  Conjunctiva without redness, discharge or icterus. Neck/lymp/endocrine: Supple,no lymphadenopathy, no thyromegaly CV:  RRR 1/6 SM, no edema, +2/4 P posterior tibialis pulses. no carotid bruits. No JVD. Chest: CTAB, no wheeze, rhonchi or crackles. normal Respiratory effort. good Air movement. Abd: Soft. obese. NTND. BS present. no Masses palpated. No hepatosplenomegaly. No rebound tenderness or guarding. Skin: no rashes, purpura or petechiae. Warm and well-perfused. Skin intact. Neuro/Msk:  Normal gait. PERLA. EOMi. Alert. Oriented x3.  Cranial nerves II through XII intact. Muscle strength 5/5 upper/lower extremity. DTRs equal bilaterally. Psych: Normal affect, dress and demeanor. Normal speech. Normal thought content and judgment.  No exam data present  Assessment/plan: Cathy [name] is a 52 y.o. female present for CPE. Immunization due - Flu Vaccine QUAD 6+ mos PF IM (Fluarix Quad PF) Screening for deficiency anemia - CBC w/Diff Diabetes mellitus screening - HgB A1c Elevated LDL cholesterol level - Lipid panel - Comp Met (CMET) - TSH Obesity (BMI 30-39.9) - Lipid panel - Comp Met (CMET) Colon cancer screening Schedule after Nov. - Ambulatory referral to Gastroenterology Encounter for preventive health examination Patient was encouraged to exercise greater than 150 minutes a  week. Patient was encouraged to choose a diet filled with fresh fruits and vegetables, and lean meats. AVS provided to patient today for education/recommendation on gender specific health and safety maintenance. Colonoscopy: no Fhx. Screen is over due. Referral placed today.  Mammogram: completed:06/2018, birads 1.  Cervical cancer screening: last pelvic: 06/2018, results, completed by: GYN. Dr Marvel Plan Immunizations: tdap UTD 2017, Influenza administered today (encouraged yearly) Infectious disease screening: HIV completed  DEXA: screen ~60- 65  Return in about 1 year (around 08/27/2019) for CPE.  Electronically signed by: Howard Pouch, DO [place]

## 2018-08-27 LAB — CBC WITH DIFFERENTIAL/PLATELET
Basophils Absolute: 0.1 10*3/uL (ref 0.0–0.1)
Basophils Relative: 0.9 % (ref 0.0–3.0)
Eosinophils Absolute: 0.2 10*3/uL (ref 0.0–0.7)
Eosinophils Relative: 3.4 % (ref 0.0–5.0)
HEMATOCRIT: 39.9 % (ref 36.0–46.0)
HEMOGLOBIN: 13.4 g/dL (ref 12.0–15.0)
LYMPHS PCT: 25.5 % (ref 12.0–46.0)
Lymphs Abs: 1.6 10*3/uL (ref 0.7–4.0)
MCHC: 33.6 g/dL (ref 30.0–36.0)
MCV: 95.6 fl (ref 78.0–100.0)
Monocytes Absolute: 0.5 10*3/uL (ref 0.1–1.0)
Monocytes Relative: 7.5 % (ref 3.0–12.0)
NEUTROS PCT: 62.7 % (ref 43.0–77.0)
Neutro Abs: 4 10*3/uL (ref 1.4–7.7)
Platelets: 234 10*3/uL (ref 150.0–400.0)
RBC: 4.17 Mil/uL (ref 3.87–5.11)
RDW: 13.5 % (ref 11.5–15.5)
WBC: 6.4 10*3/uL (ref 4.0–10.5)

## 2018-08-27 LAB — LIPID PANEL
CHOL/HDL RATIO: 4
Cholesterol: 205 mg/dL — ABNORMAL HIGH (ref 0–200)
HDL: 52.6 mg/dL (ref >39.00)
LDL CALC: 127 mg/dL — AB (ref 0–99)
NONHDL: 152.35
Triglycerides: 127 mg/dL (ref 0.0–149.0)
VLDL: 25.4 mg/dL (ref 0.0–40.0)

## 2018-08-27 LAB — COMPREHENSIVE METABOLIC PANEL
ALT: 9 U/L (ref 0–35)
AST: 11 U/L (ref 0–37)
Albumin: 4.3 g/dL (ref 3.5–5.2)
Alkaline Phosphatase: 52 U/L (ref 39–117)
BUN: 11 mg/dL (ref 6–23)
CO2: 31 meq/L (ref 19–32)
Calcium: 9.7 mg/dL (ref 8.4–10.5)
Chloride: 104 mEq/L (ref 96–112)
Creatinine, Ser: 1.02 mg/dL (ref 0.40–1.20)
GFR: 60.68 mL/min (ref >60.00)
GLUCOSE: 90 mg/dL (ref 70–99)
POTASSIUM: 4.8 meq/L (ref 3.5–5.1)
Sodium: 139 mEq/L (ref 135–145)
Total Bilirubin: 0.4 mg/dL (ref 0.2–1.2)
Total Protein: 6.7 g/dL (ref 6.0–8.3)

## 2018-08-27 LAB — TSH: TSH: 2.47 u[IU]/mL (ref 0.35–4.50)

## 2018-08-27 LAB — HEMOGLOBIN A1C: Hgb A1c MFr Bld: 5.4 % (ref 4.6–6.5)

## 2018-09-01 ENCOUNTER — Telehealth: Payer: Self-pay | Admitting: *Deleted

## 2018-09-01 NOTE — Telephone Encounter (Signed)
Copied from [place] [phone]. Topic: Inquiry >> Sep 01, 2018 10:04 AM Reyne Dumas L wrote: Reason for CRM:   Pt states that her work needs a note that she had her physical last week and is fit for work.  Pt would like this faxed to Pia Mau at [employer] (states she left a release form with this persons name and fax number). Pt can be reached at [phone]

## 2018-09-01 NOTE — Telephone Encounter (Signed)
She did have her yearly preventive physical last week, and there are no known reasons why she would not be "fit for work" from a medical stand point.   We can supply a note stating the above. Their wording is odd and typically seen when they are expecting an occupational health exam with lift/strength test, which is not what is done here.

## 2018-09-02 ENCOUNTER — Encounter: Payer: Self-pay | Admitting: *Deleted

## 2018-09-02 NOTE — Progress Notes (Signed)
Letter printed.

## 2018-09-02 NOTE — Telephone Encounter (Signed)
Letter printed.spoke with patient let her know we would fax letter as requested.

## 2018-09-03 NOTE — Telephone Encounter (Signed)
Tried to fax letter 2 days in a row number rings but no answer. Spoke with patient she requested we mail the letter to [employer]. Letter mailed to address provided by patient.

## 2018-11-25 ENCOUNTER — Encounter: Payer: Self-pay | Admitting: Family Medicine

## 2018-12-15 ENCOUNTER — Ambulatory Visit (INDEPENDENT_AMBULATORY_CARE_PROVIDER_SITE_OTHER): Payer: BLUE CROSS/BLUE SHIELD | Admitting: Physician Assistant

## 2018-12-15 ENCOUNTER — Encounter: Payer: Self-pay | Admitting: Physician Assistant

## 2018-12-15 VITALS — BP 130/90 | HR 62 | Temp 98.3°F | Ht 61.0 in | Wt 165.5 lb

## 2018-12-15 DIAGNOSIS — G43919 Migraine, unspecified, intractable, without status migrainosus: Secondary | ICD-10-CM

## 2018-12-15 NOTE — Progress Notes (Signed)
Cathy [name] is a 52 y.o. female here for a new problem.  I acted as a Education administrator for [employer], PA-C Anselmo Pickler, LPN  History of Present Illness:   Chief Complaint  Patient presents with  . Migraine    Migraine   This is a new problem. Episode onset: Started on Friday. The problem has been waxing and waning. The pain is located in the frontal (back of head near neck) region. Radiates to: behind left eye. The pain quality is similar to prior headaches (Hx Migraines, last this bad 15 years ago). The quality of the pain is described as pulsating, shooting, squeezing, stabbing and throbbing. The pain is at a severity of 10/10. The pain is severe. Associated symptoms include blurred vision (Left eye), drainage (eye in the morning, cakey), ear pain, eye pain, eye watering, nausea, photophobia, scalp tenderness and vomiting (Sat and Sun). Pertinent negatives include no fever, muscle aches, numbness, sinus pressure, sore throat, swollen glands or tingling. The symptoms are aggravated by bright light and activity. Treatments tried: Excedrin Migraine, Ibuprofen, Pepto bismol. The treatment provided no relief. Her past medical history is significant for migraine headaches.   Patient tells this provider that this is "the worst migraine of her life" and is also having chest pain.  Past Medical History:  Diagnosis Date  . Depression   . Dyspnea on exertion    With atypical chest pain, followed by cardiology.  . Genital warts 78/2956   TCA application by GYn  . History of frequent urinary tract infections   . Juvenile epilepsy ([place])   . Migraines   . Seizures ([place])    none since 1987- classified as epileptic seizures  . Thrombosed external hemorrhoid      Social History   Socioeconomic History  . Marital status: Divorced    Spouse name: Not on file  . Number of children: 3  . Years of education: 59  . Highest education level: Not on file  Occupational History  . 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 Financial resource strain: Not on file  . Food insecurity:    Worry: Not on file    Inability: Not on file  . Transportation needs:    Medical: Not on file    Non-medical: Not on file  Tobacco Use  . Smoking status: Never Smoker  . Smokeless tobacco: Never Used  Substance and Sexual Activity  . Alcohol use: Yes    Alcohol/week: 1.0 standard drinks    Types: 1 Standard drinks or equivalent per week    Comment: socially  . Drug use: No  . Sexual activity: Yes    Partners: Male    Birth control/protection: Pill  Lifestyle  . Physical activity:    Days per week: Not on file    Minutes per session: Not on file  . Stress: Not on file  Relationships  . Social connections:    Talks on phone: Not on file    Gets together: Not on file    Attends religious service: Not on file    Active member of club or organization: Not on file    Attends meetings of clubs or organizations: Not on file    Relationship status: Not on file  . Intimate partner violence:    Fear of current or ex partner: Not on file    Emotionally abused: Not on file    Physically abused: Not on file  Forced sexual activity: Not on file  Other Topics Concern  . Not on file  Social History Narrative   Divorced. 3 children. Bachelors degree. Works as an Optometrist.   Exercises routinely.   Wears her seatbelt. Wears a bicycle helmet. Smoke detector in the home.   Feels safe in her relationships.    Past Surgical History:  Procedure Laterality Date  . BILATERAL SALPINGECTOMY Bilateral 12/20/2014   Procedure: BILATERAL SALPINGECTOMY;  Surgeon: Logan Bores, MD;  Location: [facility];  Service: Gynecology;  Laterality: Bilateral;  . HEMORRHOID SURGERY     thrombosed hemorrhoid  . LAPAROSCOPIC ASSISTED VAGINAL HYSTERECTOMY N/A 12/20/2014   Procedure: LAPAROSCOPIC ASSISTED VAGINAL HYSTERECTOMY;  Surgeon: Logan Bores, MD;  Location:  [facility];  Service: Gynecology;  Laterality: N/A;  . tendon repair Right 2011   right ankle  . TONSILLECTOMY  1973    Family History  Problem Relation Age of Onset  . Hyperlipidemia Mother   . Heart disease Mother   . Hypertension Mother   . Ovarian cancer Mother   . Cervical cancer Mother   . Heart attack Mother   . Depression Sister   . Asthma Sister   . Asthma Son   . Dementia Maternal Grandmother   . Heart attack Maternal Grandmother   . Heart attack Maternal Grandfather   . Heart attack Paternal Grandmother     Allergies  Allergen Reactions  . Macrobid [Nitrofurantoin Monohyd Macro] Anaphylaxis    Current Medications:  No current outpatient medications on file.   Review of Systems:   Review of Systems  Constitutional: Negative for fever.  HENT: Positive for ear pain. Negative for sinus pressure and sore throat.   Eyes: Positive for blurred vision (Left eye), photophobia and pain.  Gastrointestinal: Positive for nausea and vomiting (Sat and Sun).  Neurological: Negative for tingling and numbness.    Vitals:   Vitals:   12/15/18 1506  BP: 130/90  Pulse: 62  Temp: 98.3 F (36.8 C)  TempSrc: Oral  SpO2: 97%  Weight: 165 lb 8 oz (75.1 kg)  Height: 5\' 1"  (1.549 m)     Body mass index is 31.27 kg/m.  Physical Exam:   Physical Exam Vitals signs and nursing note reviewed.  Constitutional:      General: She is not in acute distress.    Appearance: She is well-developed. She is not ill-appearing or toxic-appearing.  HENT:     Head: Normocephalic and atraumatic.     Right Ear: Tympanic membrane, ear canal and external ear normal. Tympanic membrane is not erythematous, retracted or bulging.     Left Ear: Tympanic membrane, ear canal and external ear normal. Tympanic membrane is not erythematous, retracted or bulging.     Nose: Nose normal.     Right Sinus: No maxillary sinus tenderness or frontal sinus tenderness.     Left Sinus: No maxillary sinus  tenderness or frontal sinus tenderness.     Mouth/Throat:     Pharynx: Uvula midline. No posterior oropharyngeal erythema.  Eyes:     General: Lids are normal.     Conjunctiva/sclera: Conjunctivae normal.  Neck:     Trachea: Trachea normal.  Cardiovascular:     Rate and Rhythm: Normal rate and regular rhythm.     Heart sounds: Normal heart sounds, S1 normal and S2 normal.  Pulmonary:     Effort: Pulmonary effort is normal.     Breath sounds: Normal breath sounds. No decreased breath sounds, wheezing, rhonchi or rales.  Lymphadenopathy:     Cervical: No cervical adenopathy.  Skin:    General: Skin is warm and dry.  Neurological:     Mental Status: She is alert.     GCS: GCS eye subscore is 4. GCS verbal subscore is 5. GCS motor subscore is 6.     Cranial Nerves: Cranial nerves are intact.     Sensory: Sensation is intact.     Motor: Motor function is intact.  Psychiatric:        Speech: Speech normal.        Behavior: Behavior normal. Behavior is cooperative.       Assessment and Plan:   Taylah was seen today for migraine.  Diagnoses and all orders for this visit:  Intractable migraine without status migrainosus, unspecified migraine type   Several red flags on exam -- sudden onset, currently worst HA of life, 10/10 pain and has chest pain. Discussed ER evaluation for further work-up and treatment.  . Reviewed expectations re: course of current medical issues. . Discussed self-management of symptoms. . Outlined signs and symptoms indicating need for more acute intervention. . Patient verbalized understanding and all questions were answered. . See orders for this visit as documented in the electronic medical record. . Patient received an After-Visit Summary.  CMA or LPN served as scribe during this visit. History, Physical, and Plan performed by medical provider. The above documentation has been reviewed and is accurate and complete.  Inda Coke, PA-C

## 2018-12-15 NOTE — Patient Instructions (Signed)
It was great to see you!  Please go to the ER for further work-up.  Take care,  Inda Coke PA-C

## 2018-12-22 ENCOUNTER — Ambulatory Visit: Payer: Self-pay

## 2018-12-22 NOTE — Telephone Encounter (Signed)
Patient called in with c/o "headache." She says "I've been dealing with this headache for almost 2 weeks. I called the office on last Monday for an appointment, but there were no openings. I went to [facility] and saw someone. She wanted me to go to the ED for more testing, but I just can't afford that bill. So, I've been taking Tylenol, Ibuprofen, and Excedrin Migraine. The pain intensity goes down, but I'm never without a headache. It hurts to the top of my head, the base of my neck, and behind my left eye mainly. The pain is a 5-1/2 right now. I had migraines years ago and was on Imitrex, but I haven't had a headache this bad since then." I asked about other symptoms, she says "I have nausea, the vomiting stopped over the weekend; I have a little stiffness to the base of my neck, but I use heating pads to relieve the pressure; I have pressure behind my left eye mainly; I have numbness to my forehead for the past couple of days." According to protocol, see PCP within 3 days. Patient says she goes back to work on Thursday, so tomorrow is her best time to come in. I called the office and spoke to [place], [facility] who says she will call the patient back with an appointment, because the slots are all Same Day slots. I advised the patient of the above from [place], she verbalized understanding of above and care advice.  Reason for Disposition . [1] MILD-MODERATE headache AND [2] present > 72 hours  Answer Assessment - Initial Assessment Questions 1. LOCATION: "Where does it hurt?"      Top of head, base of neck, behind left eye primarily 2. ONSET: "When did the headache start?" (Minutes, hours or days)      2 weeks 3. PATTERN: "Does the pain come and go, or has it been constant since it started?"     Constant, but not as bad 4. SEVERITY: "How bad is the pain?" and "What does it keep you from doing?"  (e.g., Scale 1-10; mild, moderate, or severe)   - MILD (1-3): doesn't interfere with normal activities    -  MODERATE (4-7): interferes with normal activities or awakens from sleep    - SEVERE (8-10): excruciating pain, unable to do any normal activities        5 1/2 5. RECURRENT SYMPTOM: "Have you ever had headaches before?" If so, ask: "When was the last time?" and "What happened that time?"      Yes, it's been years ago 6. CAUSE: "What do you think is causing the headache?"     I don't know 7. MIGRAINE: "Have you been diagnosed with migraine headaches?" If so, ask: "Is this headache similar?"      Yes in the past, but this is the worst 8. HEAD INJURY: "Has there been any recent injury to the head?"      No 9. OTHER SYMPTOMS: "Do you have any other symptoms?" (fever, stiff neck, eye pain, sore throat, cold symptoms)     Base of neck is a little stiff (using heating pad), pressure behind the left eye mainly, a little numbness to forehead for the past couple of days 10. PREGNANCY: "Is there any chance you are pregnant?" "When was your last menstrual period?"       No  Protocols used: HEADACHE-A-AH

## 2018-12-22 NOTE — Telephone Encounter (Signed)
Spoke with pt and reassured her of her symptoms. She requested an appt for tomorrow. Advised pt that I can make her an appt for 9am tomorrow, pt agreed and advised if her headaches gets worse over night, she will need to go to the ER. Pt voiced understanding. Appt made.

## 2018-12-23 ENCOUNTER — Ambulatory Visit (INDEPENDENT_AMBULATORY_CARE_PROVIDER_SITE_OTHER): Payer: BLUE CROSS/BLUE SHIELD | Admitting: Family Medicine

## 2018-12-23 ENCOUNTER — Encounter: Payer: Self-pay | Admitting: Family Medicine

## 2018-12-23 VITALS — BP 138/74 | HR 50 | Temp 98.1°F | Resp 16 | Ht 61.0 in | Wt 166.0 lb

## 2018-12-23 DIAGNOSIS — R110 Nausea: Secondary | ICD-10-CM | POA: Diagnosis not present

## 2018-12-23 DIAGNOSIS — B5801 Toxoplasma chorioretinitis: Secondary | ICD-10-CM | POA: Diagnosis not present

## 2018-12-23 DIAGNOSIS — G8929 Other chronic pain: Secondary | ICD-10-CM | POA: Insufficient documentation

## 2018-12-23 DIAGNOSIS — R197 Diarrhea, unspecified: Secondary | ICD-10-CM

## 2018-12-23 DIAGNOSIS — G4452 New daily persistent headache (NDPH): Secondary | ICD-10-CM | POA: Insufficient documentation

## 2018-12-23 DIAGNOSIS — R51 Headache: Secondary | ICD-10-CM

## 2018-12-23 DIAGNOSIS — R519 Headache, unspecified: Secondary | ICD-10-CM | POA: Insufficient documentation

## 2018-12-23 LAB — CBC
HCT: 40.7 % (ref 36.0–46.0)
Hemoglobin: 13.7 g/dL (ref 12.0–15.0)
MCHC: 33.7 g/dL (ref 30.0–36.0)
MCV: 95.9 fl (ref 78.0–100.0)
PLATELETS: 209 10*3/uL (ref 150.0–400.0)
RBC: 4.24 Mil/uL (ref 3.87–5.11)
RDW: 13.2 % (ref 11.5–15.5)
WBC: 5.9 10*3/uL (ref 4.0–10.5)

## 2018-12-23 LAB — COMPREHENSIVE METABOLIC PANEL
ALBUMIN: 4.4 g/dL (ref 3.5–5.2)
ALT: 9 U/L (ref 0–35)
AST: 9 U/L (ref 0–37)
Alkaline Phosphatase: 46 U/L (ref 39–117)
BUN: 12 mg/dL (ref 6–23)
CHLORIDE: 105 meq/L (ref 96–112)
CO2: 27 mEq/L (ref 19–32)
Calcium: 9.6 mg/dL (ref 8.4–10.5)
Creatinine, Ser: 0.86 mg/dL (ref 0.40–1.20)
GFR: 73.79 mL/min (ref >60.00)
GLUCOSE: 97 mg/dL (ref 70–99)
POTASSIUM: 4.4 meq/L (ref 3.5–5.1)
Sodium: 139 mEq/L (ref 135–145)
Total Bilirubin: 0.5 mg/dL (ref 0.2–1.2)
Total Protein: 6.6 g/dL (ref 6.0–8.3)

## 2018-12-23 LAB — MAGNESIUM: MAGNESIUM: 2.1 mg/dL (ref 1.5–2.5)

## 2018-12-23 LAB — VITAMIN B12: Vitamin B-12: 164 pg/mL — ABNORMAL LOW (ref 211–911)

## 2018-12-23 LAB — TSH: TSH: 2.09 u[IU]/mL (ref 0.35–4.50)

## 2018-12-23 MED ORDER — KETOROLAC TROMETHAMINE 60 MG/2ML IM SOLN
60.0000 mg | Freq: Once | INTRAMUSCULAR | Status: AC
  Administered 2018-12-23: 60 mg via INTRAMUSCULAR

## 2018-12-23 MED ORDER — PROMETHAZINE HCL 25 MG PO TABS: 25.0000 mg | ORAL_TABLET | Freq: Three times a day (TID) | ORAL | 0 refills | Status: DC | PRN

## 2018-12-23 MED ORDER — SUMATRIPTAN SUCCINATE 50 MG PO TABS: 50.0000 mg | ORAL_TABLET | ORAL | 0 refills | Status: DC | PRN

## 2018-12-23 MED ORDER — AZITHROMYCIN 500 MG PO TABS: 500.0000 mg | ORAL_TABLET | Freq: Every day | ORAL | 0 refills | Status: DC

## 2018-12-23 NOTE — Progress Notes (Signed)
Cathy [name] , December 05, 1966, 52 y.o., female MRN: [id number] Patient Care Team    Relationship Specialty Notifications Start End  Ma Hillock, DO PCP - General Family Medicine  10/07/17   Lorretta Harp, MD Consulting Physician Cardiology  09/27/17   Paula Compton, MD Consulting Physician Obstetrics and Gynecology  09/27/17     Chief Complaint  Patient presents with  . Headache    2 wks, went to [facility] on [place] 12/23, told to go to ER for further testing. Pt admitted to not going, due to fiancial reasons. Pt says it seems to be getting better and still notes headache. Still has diarrhea and pain behind her eyes radiates to her ears.     Subjective: Pt presents for an OV with complaints of headache of 2 weeks duration.  SHe was seen by another provider on December 23 for this condition, that started Dec. 20th and encouraged to report to the emergency room for alarming symptoms with her headache.  She states she did not report to the emergency room as instructed 2/2 financial limitations.  Presents today secondary to her headache has yet to resolve.  Associated symptoms with her headache has been vomiting initially, which has resolved-but nausea remains.  Watery diarrhea occurring daily, worse when she eats that has been present since onset of headache- but absent for last 18 hours.  SHe reports the actual head pain has improved a fraction since being seen on the 23rd it was a "constant pounding pain" but has remained a constant pressure pain around her eyes and occipital area.  Reports pain in bilateral ears and nausea remains.  She endorses feeling mildly lightheaded or "foggy .  "   She has had a history of migraines that used to occur frequently many years ago.  She states she has not really had problems with her migraines in the last decade.  Used to take Imitrex when she did have migraines.  Currently she has been taking NSAIDs and Excedrin Migraine to help with her headaches.   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  Normal MRI in 2002-completed secondary to headaches and history of right retinal toxoplasmosis She denies fevers or chills during the last 2 weeks.  SHe denies illness prior to onset of headache.  She denies injury or head trauma.  She denies slurred speech, difficulty swallowing, ringing of her ears, visual changes, unilateral muscle weakness, unsteady gait.  She states that the headache and symptoms started abruptly.  No exposure to sick contacts, under prepared food, antibiotics or travel outside the [place].  Depression screen [facility] 2/9 08/26/2018 09/27/2017  Decreased Interest 0 0  Down, Depressed, Hopeless 0 1  PHQ - 2 Score 0 1    Allergies  Allergen Reactions  . Macrobid [Nitrofurantoin Monohyd Macro] Anaphylaxis   Social History   Tobacco Use  . Smoking status: Never Smoker  . Smokeless tobacco: Never Used  Substance Use Topics  . Alcohol use: Yes    Alcohol/week: 1.0 standard drinks    Types: 1 Standard drinks or equivalent per week    Comment: socially   Past Medical History:  Diagnosis Date  . Depression   . Dyspnea on exertion    With atypical chest pain, followed by cardiology.  . Genital warts 13/2440   TCA application by GYn  . History of frequent urinary tract infections   . 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  Migraines   . Seizures ([place])    none since 1987- classified as epileptic seizures  . Thrombosed external hemorrhoid    Past Surgical History:  Procedure Laterality Date  . BILATERAL SALPINGECTOMY Bilateral 12/20/2014   Procedure: BILATERAL SALPINGECTOMY;  Surgeon: Logan Bores, MD;  Location: [facility];  Service: Gynecology;  Laterality: Bilateral;  . HEMORRHOID SURGERY     thrombosed hemorrhoid  . LAPAROSCOPIC ASSISTED VAGINAL HYSTERECTOMY N/A 12/20/2014   Procedure: LAPAROSCOPIC ASSISTED VAGINAL HYSTERECTOMY;  Surgeon: Logan Bores, MD;  Location: [facility];  Service: Gynecology;  Laterality: N/A;  . tendon repair Right 2011   right ankle  . TONSILLECTOMY  1973   Family History  Problem Relation Age of Onset  . Hyperlipidemia Mother   . Heart disease Mother   . Hypertension Mother   . Ovarian cancer Mother   . Cervical cancer Mother   . Heart attack Mother   . Depression Sister   . Asthma Sister   . Asthma Son   . Dementia Maternal Grandmother   . Heart attack Maternal Grandmother   . Heart attack Maternal Grandfather   . Heart attack Paternal Grandmother    Allergies as of 12/23/2018      Reactions   Macrobid [nitrofurantoin Monohyd Macro] Anaphylaxis      Medication List       Accurate as of December 23, 2018  9:50 AM. Always use your most recent med list.        acetaminophen 500 MG tablet Commonly known as:  TYLENOL Take 500 mg by mouth every 6 (six) hours as needed.   bismuth subsalicylate 469 GE/95MW suspension Commonly known as:  PEPTO BISMOL Take 30 mLs by mouth every 6 (six) hours as needed.   bismuth subsalicylate 413 MG chewable tablet Commonly known as:  PEPTO BISMOL Chew 524 mg by mouth as needed.   EXCEDRIN MIGRAINE PO Take by mouth.   ibuprofen 800 MG tablet Commonly known as:  ADVIL,MOTRIN Take 800 mg by mouth every 8 (eight) hours as needed.       All past medical history, surgical history, allergies, family history, immunizations andmedications were updated in the EMR today and reviewed under the history and medication portions of their EMR.     ROS: Negative, with the exception of above mentioned in HPI   Objective:  BP 138/74 (BP Location: Left Arm, Patient Position: Sitting, Cuff Size: Normal)   Pulse (!) 50   Temp 98.1 F (36.7 C) (Oral)   Resp 16   Ht '5\' 1"'  (1.549 m)   Wt 166 lb (75.3 kg)   LMP 11/19/2014   SpO2 100%   BMI 31.37 kg/m  Body mass index is 31.37 kg/m. Gen: Afebrile. No acute distress. Nontoxic in appearance, well developed, well nourished.  HENT: AT. [place].  Bilateral TM visualized without erythema or fullness. MMM, no oral lesions. Bilateral nares without erythema, drainage or swelling. Throat without erythema or exudates.  No cough, no hoarseness. Eyes:Pupils Equal Round Reactive to light, Extraocular movements intact,  Conjunctiva without redness, discharge or icterus. Neck/lymp/endocrine: Supple, no lymphadenopathy, no thyromegaly CV: Mild bradycardia, no murmur Chest: CTAB, no wheeze or crackles. Good air movement, normal resp effort.  Skin: No rashes, purpura or petechiae.  Neuro/msk:  Normal gait. PERLA. EOMi. Alert. Oriented x3 Cranial nerves II through XII intact. Muscle strength 5/5 bilateral upper and lower extremity. DTRs equal bilaterally. Psych: Normal affect, dress and demeanor. Normal speech. Normal thought content and judgment.  No exam  data present No results found. No results found for this or any previous visit (from the past 24 hour(s)).  Assessment/Plan: SYRENITY KLEPACKI is a 52 y.o. female present for OV for  Retinal toxoplasmosis Patient reports routine ophthalmology evaluations and condition has been stable for many years.  Symptoms are not located in the eye that has toxoplasmosis. Normal Brain MRI 2002. Headache, new daily persistent ([facility]) -Concern over new daily persistent headache of 11 days.  Given she was instructed to go to the emergency room no initial work-up or treatment had been initiated.  We will start with lab evaluation, treat diarrhea with the [facility] signed prescription, which was provided for her today to start if diarrhea returns.  Will out electrolyte imbalance from possible gastro neuritis causing headache.  Treat headache with Toradol injection today, patient is to immediately fill Phenergan and take Phenergan and low-dose Benadryl, lay down the remainder of the day.  Imitrex.  was provided for future headaches, assuming this is migraine in nature. -Low threshold for MRI.  Discussed this with patient today if  labs do not indicate reversible causes, and headache remains despite treatment today, then she is to follow-up immediately and we will need to obtain MRI of the brain and consider referral to neurology.  Patient reports understanding. - CBC - Comp Met (CMET) - Magnesium - B12 - CBC - TSH - promethazine (PHENERGAN) 25 MG tablet; Take 1 tablet (25 mg total) by mouth every 8 (eight) hours as needed for nausea or vomiting.  Dispense: 20 tablet; Refill: 0 - SUMAtriptan (IMITREX) 50 MG tablet; Take 1 tablet (50 mg total) by mouth every 2 (two) hours as needed for migraine. May repeat in 2 hours if headache persists or recurs.  Dispense: 10 tablet; Refill: 0 - ketorolac (TORADOL) injection 60 mg  Diarrhea, unspecified type/nausea -If diarrhea returns start azithromycin 500 mg QD x3 days - azithromycin (ZITHROMAX) 500 MG tablet; Take 1 tablet (500 mg total) by mouth daily.  Dispense: 3 tablet; Refill: 0    Reviewed expectations re: course of current medical issues.  Discussed self-management of symptoms.  Outlined signs and symptoms indicating need for more acute intervention.  Patient verbalized understanding and all questions were answered.  Patient received an After-Visit Summary.    No orders of the defined types were placed in this encounter.    Note is dictated utilizing voice recognition software. Although note has been proof read prior to signing, occasional typographical errors still can be missed. If any questions arise, please do not hesitate to call for verification.   electronically signed by:  Howard Pouch, DO  [place]

## 2018-12-23 NOTE — Patient Instructions (Addendum)
Phenergen for nausea--> pick up immediately and take 1 tab, with 25 mg benadryl and lay down. This with the toradol injection you received today can break the cycle of headaches. Imitrex called in for future headaches (assuming this is migraine related) Start azith today 1 pill for 3 days. --> this is for potential pathogens of diarrhea.  Labs collected today to rule out other causes.  If worsening over the holidays please go to ED.   If headaches cycle can not be broken or labs do not indicate cause--> we will need to obtain image or refer you to neurology.    Cluster Headache Cluster headaches are deeply painful. They normally occur on one side of your head, but they may switch sides. Often, cluster headaches:  Are severe.  Happen often for a few weeks or months and then go away for a while.  Last from 15 minutes to 3 hours.  Happen at the same time each day.  Happen at night.  Happen many times a day. Follow these instructions at home:        Follow instructions from your doctor to care for yourself at home:  Go to bed at the same time each night. Get the same amount of sleep every night.  Avoid alcohol.  Stop smoking if you smoke. This includes cigarettes and e-cigarettes.  Take over-the-counter and prescription medicines only as told by your doctor.  Do not drive or use heavy machinery while taking prescription pain medicine.  Use oxygen as told by your doctor.  Exercise regularly.  Eat a healthy diet.  Write down when each headache happened, what kind of pain you had, how bad your pain was, and what you tried to help your pain. This is called a headache diary. Use it as told by your doctor. Contact a doctor if:  Your headaches get worse or they happen more often.  Your medicines are not helping. Get help right away if:  You pass out (faint).  You get weak or lose feeling (have numbness) on one side of your body or face.  You see two of everything  (double vision).  You feel sick to your stomach (nauseous) or you throw up (vomit), and you do not stop after many hours.  You have trouble with your balance or with walking.  You have trouble talking.  You have neck pain or stiffness.  You have a fever. This information is not intended to replace advice given to you by your health care provider. Make sure you discuss any questions you have with your health care provider. Document Released: 01/17/2005 Document Revised: 08/17/2016 Document Reviewed: 08/17/2016 Elsevier Interactive Patient Education  2019 [employer].

## 2018-12-25 ENCOUNTER — Ambulatory Visit (INDEPENDENT_AMBULATORY_CARE_PROVIDER_SITE_OTHER): Payer: BLUE CROSS/BLUE SHIELD

## 2018-12-25 ENCOUNTER — Telehealth: Payer: Self-pay | Admitting: Family Medicine

## 2018-12-25 DIAGNOSIS — E538 Deficiency of other specified B group vitamins: Secondary | ICD-10-CM | POA: Diagnosis not present

## 2018-12-25 MED ORDER — CYANOCOBALAMIN 1000 MCG/ML IJ SOLN
1000.0000 ug | Freq: Once | INTRAMUSCULAR | Status: AC
  Administered 2018-12-25: 1000 ug via INTRAMUSCULAR

## 2018-12-25 NOTE — Progress Notes (Signed)
Pt came in office this afternoon to get her first B12 injection. Pt received injection in Rt deltoid, w no side effects noted.

## 2018-12-25 NOTE — Telephone Encounter (Signed)
Spoke with pt and advised of results. Pt voices understanding and will come in later this afternoon for her first B12 injection. She will also follow up next wk if she still has a headache or if the diarrhea continues.

## 2018-12-25 NOTE — Telephone Encounter (Signed)
Please inform patient the following information: Labs are all normal with the exception of an extremely low B12.  Low B12 can cause neurological symptoms when below 400, and hers is close to 100. -I recommend she start B12 injections immediately.  Please schedule her for nurse visit for B12 1000 mcg injection once weekly for 4 doses, then every 2 weeks for 4 doses-follow-up with her provider in 3 months with recheck.  In addition, I encouraged her to start a daily B complex vitamin.  Follow-up in 1 week if headache or diarrhea remain.  If symptoms are improving with the addition of the above recommendations, follow-up in 3 months only.

## 2019-01-01 ENCOUNTER — Ambulatory Visit (INDEPENDENT_AMBULATORY_CARE_PROVIDER_SITE_OTHER): Payer: BLUE CROSS/BLUE SHIELD

## 2019-01-01 DIAGNOSIS — E538 Deficiency of other specified B group vitamins: Secondary | ICD-10-CM

## 2019-01-01 MED ORDER — CYANOCOBALAMIN 1000 MCG/ML IJ SOLN
1000.0000 ug | Freq: Once | INTRAMUSCULAR | Status: AC
  Administered 2019-01-01: 1000 ug via INTRAMUSCULAR

## 2019-01-01 NOTE — Progress Notes (Addendum)
Cathy [name] is a 53 y.o. female presents to the office today for B12 injection, per physician's orders. Original order: 12/25/2018, start B12 injections immediately.  Please schedule her for nurse visit for B12 1000 mcg injection once weekly for 4 doses, then every 2 weeks for 4 doses-follow-up with her provider in 3 months with recheck. B12 1065mcg SQ was administered in right arm today. Patient tolerated injection. Patient due for follow up labs/provider appt: Yes. Date due: 03/26/2019, appt made Yes Patient next injection due: 01/08/2019, appt made Yes  [place] screening examination/treatment/procedure(s) were performed by non-physician practitioner and as supervising physician I was immediately available for consultation/collaboration.  I agree with above assessment and plan.  Electronically Signed by: Howard Pouch, DO [place] [facility] [place]

## 2019-01-08 ENCOUNTER — Ambulatory Visit (INDEPENDENT_AMBULATORY_CARE_PROVIDER_SITE_OTHER): Payer: BLUE CROSS/BLUE SHIELD

## 2019-01-08 DIAGNOSIS — E538 Deficiency of other specified B group vitamins: Secondary | ICD-10-CM | POA: Diagnosis not present

## 2019-01-08 MED ORDER — CYANOCOBALAMIN 1000 MCG/ML IJ SOLN
1000.0000 ug | Freq: Once | INTRAMUSCULAR | Status: AC
Start: 2019-01-08 — End: 2019-01-08
  Administered 2019-01-08: 1000 ug via INTRAMUSCULAR

## 2019-01-08 NOTE — Progress Notes (Addendum)
Cathy [name] is a 53 y.o. female presents to the office today forB12  injections, per physician's orders. 3:4 of the weekly injections Original order: 12/25/17-Please schedule her for nurse visit for B12 1000 mcg injection once weekly for 4 doses, then every 2 weeks for 4 doses-follow-up with her provider in 3 months with recheck Cyanocobalamin, 1024mcg,  IM was administered R deltoid today.  Patient tolerated injection. Patient due for follow up labs/provider appt: No. Date due: 03/27/2019, appt made Yes Patient next injection due: 01/15/19, appt made Yes  [place] screening examination/treatment/procedure(s) were performed by non-physician practitioner and as supervising physician I was immediately available for consultation/collaboration.  I agree with above assessment and plan.  Electronically Signed by: Howard Pouch, DO [place] [facility] [place]

## 2019-01-15 ENCOUNTER — Ambulatory Visit (INDEPENDENT_AMBULATORY_CARE_PROVIDER_SITE_OTHER): Payer: BLUE CROSS/BLUE SHIELD

## 2019-01-15 DIAGNOSIS — E538 Deficiency of other specified B group vitamins: Secondary | ICD-10-CM

## 2019-01-15 MED ORDER — CYANOCOBALAMIN 1000 MCG/ML IJ SOLN
1000.0000 ug | Freq: Once | INTRAMUSCULAR | Status: AC
  Administered 2019-01-15: 1000 ug via INTRAMUSCULAR

## 2019-01-15 NOTE — Progress Notes (Addendum)
Cathy [name] is a 53 y.o. female presents to the office today for B12 injection, per physician's orders. 4:4 of the weekly injections  Original order: 12/25/17-Please schedule her for nurse visit for B12 1000 mcg injection once weekly for 4 doses, then every 2 weeks for 4 doses-follow-up with her provider in 3 months with recheck cyanocabalamin, 1000mg , IM was administered Rt Deltoid today.  Patient tolerated injection. Patient due for follow up labs/provider appt: No. Date due: 03/27/19, appt made Yes Patient next injection due: 01/30/19, appt made Yes  [place] screening examination/treatment/procedure(s) were performed by non-physician practitioner and as supervising physician I was immediately available for consultation/collaboration.  I agree with above assessment and plan.  Electronically Signed by: Howard Pouch, DO [place] [facility] [place]

## 2019-01-16 ENCOUNTER — Ambulatory Visit: Payer: Self-pay

## 2019-01-16 DIAGNOSIS — R05 Cough: Secondary | ICD-10-CM | POA: Diagnosis not present

## 2019-01-16 DIAGNOSIS — H6693 Otitis media, unspecified, bilateral: Secondary | ICD-10-CM | POA: Diagnosis not present

## 2019-01-16 NOTE — Telephone Encounter (Signed)
LM for pt advising her if she was having SOB and difficultly breathing, then she will need to go to the ER to be evaluated. Advised she can call our office back if she has any questions or concerns.

## 2019-01-16 NOTE — Telephone Encounter (Signed)
Patient called in with c/o "difficulty breathing." She says "this has been going on since December, but constant and getting worse the past 3 weeks. It's noticeable by others that I get so winded when I'm walking to the car. I am SOB at rest and with minimal exertion at times." I asked about other symptoms, fever, she says "I don't know about a fever, but I stay cold all the time. I have a cough that started at 0300 this morning. I have chest tightness/pressure and a little dizzy." According to protocol, see PCP within 3 days. I called the office and spoke to [place], [facility] who spoke to Dr. Raoul Pitch about the patient's symptoms as described above, Dr. Raoul Pitch advised due to the late hour of the day, it is best to go to the UC to have a chest x-ray done for the SOB. I advised the patient of the above by Dr. Raoul Pitch and I also offered to schedule the patient at the [place] location, she refused the [place] appointment saying the last time she went, they sent her to the ED. I asked if she was going to the UC, she says she will think about what to do.  Reason for Disposition . [1] MODERATE longstanding difficulty breathing (e.g., speaks in phrases, SOB even at rest, pulse 100-120) AND [2] SAME as normal  Answer Assessment - Initial Assessment Questions 1. RESPIRATORY STATUS: "Describe your breathing?" (e.g., wheezing, shortness of breath, unable to speak, severe coughing)      Shortness of breath, wheezing, coughing 2. ONSET: "When did this breathing problem begin?"      Since December 3. PATTERN "Does the difficult breathing come and go, or has it been constant since it started?"      Past 3 weeks constant 4. SEVERITY: "How bad is your breathing?" (e.g., mild, moderate, severe)    - MILD: No SOB at rest, mild SOB with walking, speaks normally in sentences, can lay down, no retractions, pulse < 100.    - MODERATE: SOB at rest, SOB with minimal exertion and prefers to sit, cannot lie down flat, speaks  in phrases, mild retractions, audible wheezing, pulse 100-120.    - SEVERE: Very SOB at rest, speaks in single words, struggling to breathe, sitting hunched forward, retractions, pulse > 120      Moderate at times 5. RECURRENT SYMPTOM: "Have you had difficulty breathing before?" If so, ask: "When was the last time?" and "What happened that time?"      Yes, 2018-went to cardiologist had ECHO, Stress test 6. CARDIAC HISTORY: "Do you have any history of heart disease?" (e.g., heart attack, angina, bypass surgery, angioplasty)      No 7. LUNG HISTORY: "Do you have any history of lung disease?"  (e.g., pulmonary embolus, asthma, emphysema)     No 8. CAUSE: "What do you think is causing the breathing problem?"      I don't know 9. OTHER SYMPTOMS: "Do you have any other symptoms? (e.g., dizziness, runny nose, cough, chest pain, fever)     Cough started at 0300 today, chest tightness/pressure, dizziness a little 10. PREGNANCY: "Is there any chance you are pregnant?" "When was your last menstrual period?"       No 11. TRAVEL: "Have you traveled out of the country in the last month?" (e.g., travel history, exposures)       No  Protocols used: BREATHING DIFFICULTY-A-AH

## 2019-01-30 ENCOUNTER — Ambulatory Visit (INDEPENDENT_AMBULATORY_CARE_PROVIDER_SITE_OTHER): Payer: BLUE CROSS/BLUE SHIELD

## 2019-01-30 DIAGNOSIS — E538 Deficiency of other specified B group vitamins: Secondary | ICD-10-CM | POA: Diagnosis not present

## 2019-01-30 MED ORDER — CYANOCOBALAMIN 1000 MCG/ML IJ SOLN: 1000.0000 ug | Freq: Once | INTRAMUSCULAR | Status: DC

## 2019-01-30 NOTE — Progress Notes (Addendum)
Cathy [name] is a 53 y.o. female presents to the office today for B12 injection, per physician's orders. 4:4 of the weekly injections  Original order: 12/25/17-Please schedule her for nurse visit for B12 1000 mcg injection once weekly for 4 doses, then every 2 weeks for 4 doses-follow-up with her provider in 3 months with recheck  1 of 4 q2weeks, cyanocabalamin, 1000mg , IM was administered Rt Deltoid today.  Patient tolerated injection. Patient due for follow up labs/provider appt: No. Date due: 03/27/19, appt made Yes Patient next injection due: 02/13/19, appt made Yes  Caroll Rancher LPN   Pt with vit A25 deficiency.  Agree with vit B12 1000 mcg IM in office today (01/30/2019). Signed:  Crissie Sickles, MD           02/09/2019

## 2019-02-12 ENCOUNTER — Ambulatory Visit (INDEPENDENT_AMBULATORY_CARE_PROVIDER_SITE_OTHER): Payer: BLUE CROSS/BLUE SHIELD

## 2019-02-12 DIAGNOSIS — E538 Deficiency of other specified B group vitamins: Secondary | ICD-10-CM

## 2019-02-12 MED ORDER — CYANOCOBALAMIN 1000 MCG/ML IJ SOLN
1000.0000 ug | Freq: Once | INTRAMUSCULAR | Status: AC
  Administered 2019-03-12: 1000 ug via INTRAMUSCULAR

## 2019-02-12 NOTE — Progress Notes (Signed)
Cathy L Hicksis a 53 y.o.femalepresents to the office today for B12injection, per physician's orders.4:4 of the weekly injections Original order:12/25/17-Please schedule her for nurse visit for B12 1000 mcg injection once weekly for 4 doses, then every 2 weeks for 4 doses-follow-up with her provider in 3 months with recheck  2of 4 q2weeks, cyanocabalamin,1000mg ,IMwas administered Rt Deltoidtoday.  Patient tolerated injection. Patient due for follow up labs/provider appt:No. Date due:03/27/19, appt madeYes Patient next injection due:02/26/19, appt madeYes

## 2019-02-13 NOTE — Progress Notes (Signed)
Pt with vit B12 deficiency.  Agree with vit B12 1000 mcg IM in office today. Signed:  Crissie Sickles, MD           02/13/2019

## 2019-02-26 ENCOUNTER — Ambulatory Visit (INDEPENDENT_AMBULATORY_CARE_PROVIDER_SITE_OTHER): Payer: BLUE CROSS/BLUE SHIELD

## 2019-02-26 DIAGNOSIS — E538 Deficiency of other specified B group vitamins: Secondary | ICD-10-CM

## 2019-02-26 MED ORDER — CYANOCOBALAMIN 1000 MCG/ML IJ SOLN
1000.0000 ug | Freq: Once | INTRAMUSCULAR | Status: AC
Start: 2019-02-26 — End: 2019-02-26
  Administered 2019-02-26: 1000 ug via INTRAMUSCULAR

## 2019-02-26 NOTE — Progress Notes (Addendum)
Cathy [name] is a 53 y.o. female presents to the office today for B12 injections, per physician's orders.  Original order: 12/25/17-Please schedule her for nurse visit for B12 1000 mcg injection once weekly for 4 doses, then every 2 weeks for 4 doses-follow-up with her provider in 3 months with recheck   #3 out of 4 given, Cyanocabalamin, 1031mcg, IM was administered Left Deltoid today. Patient tolerated injection. Patient due for follow up labs/provider appt: No. Date due: 03/27/19, appt made Yes Patient next injection due: 03/05/19, appt made Yes   Pt scheduled for 03/12/19  Cathy [name]  ADDENDUM: Pt with vit B12 deficiency.  Agree with vit B12 1000 mcg IM in office today. Signed:  Crissie Sickles, MD           02/26/2019

## 2019-03-02 ENCOUNTER — Encounter: Payer: Self-pay | Admitting: Family Medicine

## 2019-03-02 ENCOUNTER — Ambulatory Visit (INDEPENDENT_AMBULATORY_CARE_PROVIDER_SITE_OTHER): Payer: BLUE CROSS/BLUE SHIELD | Admitting: Family Medicine

## 2019-03-02 VITALS — BP 116/78 | HR 74 | Temp 97.8°F | Resp 16 | Ht 61.0 in | Wt 169.5 lb

## 2019-03-02 DIAGNOSIS — R51 Headache: Secondary | ICD-10-CM

## 2019-03-02 DIAGNOSIS — F339 Major depressive disorder, recurrent, unspecified: Secondary | ICD-10-CM

## 2019-03-02 DIAGNOSIS — E538 Deficiency of other specified B group vitamins: Secondary | ICD-10-CM | POA: Diagnosis not present

## 2019-03-02 DIAGNOSIS — R5383 Other fatigue: Secondary | ICD-10-CM | POA: Diagnosis not present

## 2019-03-02 DIAGNOSIS — R519 Headache, unspecified: Secondary | ICD-10-CM

## 2019-03-02 LAB — VITAMIN B12: Vitamin B-12: 714 pg/mL (ref 211–911)

## 2019-03-02 LAB — VITAMIN D 25 HYDROXY (VIT D DEFICIENCY, FRACTURES): VITD: 27.71 ng/mL — ABNORMAL LOW (ref 30.00–100.00)

## 2019-03-02 MED ORDER — VENLAFAXINE HCL ER 37.5 MG PO CP24: 37.5000 mg | ORAL_CAPSULE | Freq: Every day | ORAL | 0 refills | Status: DC

## 2019-03-02 NOTE — Patient Instructions (Addendum)
We will follow up at your appt already scheduled next month  Start effexor taper in the morning.  Talk with your employer.  consider counseling- look into the family services.     Major Depressive Disorder, Adult Major depressive disorder (MDD) is a mental health condition. MDD often makes you feel sad, hopeless, or helpless. MDD can also cause symptoms in your body. MDD can affect your:  Work.  School.  Relationships.  Other normal activities. MDD can range from mild to very bad. It may occur once (single episode MDD). It can also occur many times (recurrent MDD). The main symptoms of MDD often include:  Feeling sad, depressed, or irritable most of the time.  Loss of interest. MDD symptoms also include:  Sleeping too much or too little.  Eating too much or too little.  A change in your weight.  Feeling tired (fatigue) or having low energy.  Feeling worthless.  Feeling guilty.  Trouble making decisions.  Trouble thinking clearly.  Thoughts of suicide or harming others.  Feeling weak.  Feeling agitated.  Keeping yourself from being around other people (isolation). Follow these instructions at home: Activity  Do these things as told by your doctor: ? Go back to your normal activities. ? Exercise regularly. ? Spend time outdoors. Alcohol  Talk with your doctor about how alcohol can affect your antidepressant medicines.  Do not drink alcohol. Or, limit how much alcohol you drink. ? This means no more than 1 drink a day for nonpregnant women and 2 drinks a day for men. One drink equals one of these:  12 oz of beer.  5 oz of wine.  1 oz of hard liquor. General instructions  Take over-the-counter and prescription medicines only as told by your doctor.  Eat a healthy diet.  Get plenty of sleep.  Find activities that you enjoy. Make time to do them.  Think about joining a support group. Your doctor may be able to suggest a group for you.  Keep  all follow-up visits as told by your doctor. This is important. Where to find more information:  [employer] on Mental Illness: ? www.nami.[place]: ? https://carter.com/  National Suicide Prevention Lifeline: ? 719-666-6060. This is free, 24-hour help. Contact a doctor if:  Your symptoms get worse.  You have new symptoms. Get help right away if:  You self-harm.  You see, hear, taste, smell, or feel things that are not present (hallucinate). If you ever feel like you may hurt yourself or others, or have thoughts about taking your own life, get help right away. You can go to your nearest emergency department or call:  Your local emergency services (911 in the [place]  A suicide crisis helpline, such as the National Suicide Prevention Lifeline: ? (959)549-8033. This is open 24 hours a day. This information is not intended to replace advice given to you by your health care provider. Make sure you discuss any questions you have with your health care provider. Document Released: 11/21/2015 Document Revised: 08/26/2016 Document Reviewed: 08/26/2016 Elsevier Interactive Patient Education  2019 [employer].

## 2019-03-02 NOTE — Progress Notes (Signed)
Cathy [name] , 10-30-1966, 53 y.o., female MRN: [id number] Patient Care Team    Relationship Specialty Notifications Start End  Ma Hillock, DO PCP - General Family Medicine  10/07/17   Lorretta Harp, MD Consulting Physician Cardiology  09/27/17   Paula Compton, MD Consulting Physician Obstetrics and Gynecology  09/27/17     Chief Complaint  Patient presents with  . Headache    Pt states she feels horrible after getting B12 shots and feels she has side effects from these   . Fatigue     Subjective:  Cathy [name] is present today with complaints of continued fatigue, headaches and new depression and anxiety. She reports her headaches did get better after start of b12 injections. Her lab work up w/ cbc, cmp, tsh and magnesium was normal. B12 low at ~150. She continues to be fatigued. She is overwhelmed at work working up to 20 hours a day. She has only been in this role for 5 months. She has not spoke with her employer as of yet, but she has asked for assistance.  She reports today she has had depression and anixety of an d on since she was young. She has been on zoloft in the past. She has seen a therapist in the past for about 8 years.  Prior note:  Pt presents for an OV with complaints of headache of 2 weeks duration.  SHe was seen by another provider on December 23 for this condition, that started Dec. 20th and encouraged to report to the emergency room for alarming symptoms with her headache.  She states she did not report to the emergency room as instructed 2/2 financial limitations.  Presents today secondary to her headache has yet to resolve.  Associated symptoms with her headache has been vomiting initially, which has resolved-but nausea remains.  Watery diarrhea occurring daily, worse when she eats that has been present since onset of headache- but absent for last 18 hours.  SHe reports the actual head pain has improved a fraction since being seen on the 23rd it was a  "constant pounding pain" but has remained a constant pressure pain around her eyes and occipital area.  Reports pain in bilateral ears and nausea remains.  She endorses feeling mildly lightheaded or "foggy .  "   She has had a history of migraines that used to occur frequently many years ago.  She states she has not really had problems with her migraines in the last decade.  Used to take Imitrex when she did have migraines.  Currently she has been taking NSAIDs and Excedrin Migraine to help with her headaches.  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  Normal MRI in 2002-completed secondary to headaches and history of right retinal toxoplasmosis She denies fevers or chills during the last 2 weeks.  SHe denies illness prior to onset of headache.  She denies injury or head trauma.  She denies slurred speech, difficulty swallowing, ringing of her ears, visual changes, unilateral muscle weakness, unsteady gait.  She states that the headache and symptoms started abruptly.  No exposure to sick contacts, under prepared food, antibiotics or travel outside the [place].  Depression screen [facility] 2/9 03/02/2019 08/26/2018 09/27/2017  Decreased Interest 2 0 0  Down, Depressed, Hopeless 3 0 1  PHQ - 2 Score 5 0 1  Altered sleeping 2 - -  Tired, decreased energy 2 - -  Change in appetite 2 - -  Feeling bad or failure about yourself  3 - -  Trouble concentrating 2 - -  Moving slowly or fidgety/restless 1 - -  Suicidal thoughts 0 - -  PHQ-9 Score 17 - -  Difficult doing work/chores Very difficult - -   GAD 7 : Generalized Anxiety Score 03/02/2019  Nervous, Anxious, on Edge 3  Control/stop worrying 3  Worry too much - different things 3  Trouble relaxing 3  Restless 3  Easily annoyed or irritable 3  Afraid - awful might happen 1  Total GAD 7 Score 19  Anxiety Difficulty Very difficult    Allergies  Allergen Reactions  . Macrobid [Nitrofurantoin Monohyd  Macro] Anaphylaxis   Social History   Tobacco Use  . Smoking status: Never Smoker  . Smokeless tobacco: Never Used  Substance Use Topics  . Alcohol use: Yes    Alcohol/week: 1.0 standard drinks    Types: 1 Standard drinks or equivalent per week    Comment: socially   Past Medical History:  Diagnosis Date  . Depression   . Dyspnea on exertion    With atypical chest pain, followed by cardiology.  . Genital warts [id number]/7672   TCA application by GYn  . History of frequent urinary tract infections   . 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 Migraines   . Seizures ([place])    none since 1987- classified as epileptic seizures  . Thrombosed external hemorrhoid    Past Surgical History:  Procedure Laterality Date  . BILATERAL SALPINGECTOMY Bilateral 12/20/2014   Procedure: BILATERAL SALPINGECTOMY;  Surgeon: Logan Bores, MD;  Location: [facility];  Service: Gynecology;  Laterality: Bilateral;  . HEMORRHOID SURGERY     thrombosed hemorrhoid  . LAPAROSCOPIC ASSISTED VAGINAL HYSTERECTOMY N/A 12/20/2014   Procedure: LAPAROSCOPIC ASSISTED VAGINAL HYSTERECTOMY;  Surgeon: Logan Bores, MD;  Location: [facility];  Service: Gynecology;  Laterality: N/A;  . tendon repair Right 2011   right ankle  . TONSILLECTOMY  1973   Family History  Problem Relation Age of Onset  . Hyperlipidemia Mother   . Heart disease Mother   . Hypertension Mother   . Ovarian cancer Mother   . Cervical cancer Mother   . Heart attack Mother   . Depression Sister   . Asthma Sister   . Asthma Son   . Dementia Maternal Grandmother   . Heart attack Maternal Grandmother   . Heart attack Maternal Grandfather   . Heart attack Paternal Grandmother    Allergies as of 03/02/2019      Reactions   Macrobid [nitrofurantoin Monohyd Macro] Anaphylaxis      Medication List       Accurate as of March 02, 2019  2:20 PM. Always use your most recent med list.        SUMAtriptan 50 MG tablet Commonly known as:  Imitrex Take 1  tablet (50 mg total) by mouth every 2 (two) hours as needed for migraine. May repeat in 2 hours if headache persists or recurs.   venlafaxine XR 37.5 MG 24 hr capsule Commonly known as:  Effexor XR Take 1 capsule (37.5 mg total) by mouth daily with breakfast.       All past medical history, surgical history, allergies, family history, immunizations andmedications were updated in the EMR today and reviewed under the history and medication portions of their EMR.     ROS: Negative, with the exception of above mentioned in HPI   Objective:  BP 116/78 (BP Location: Right Arm, Patient Position: Sitting, Cuff Size: Normal)   Pulse 74   Temp 97.8 F (36.6 C) (Oral)   Resp 16   Ht '5\' 1"'  (1.549 m)   Wt 169 lb 8 oz (76.9 kg)   LMP 11/19/2014   SpO2 96%   BMI 32.03 kg/m  Body mass index is 32.03 kg/m. Gen: Afebrile. No acute distress. Nontoxic.  HENT: AT. [place].  MMM. Eyes:Pupils Equal Round Reactive to light, Extraocular movements intact,  Conjunctiva without redness, discharge or icterus. Neck/lymp/endocrine: Supple,no lymphadenopathy, no thyromegaly CV: RRR no murmur, no edema Chest: CTAB, no wheeze or crackles Skin: no rashes, purpura or petechiae.  Neuro: Normal gait. PERLA. EOMi. Alert. Oriented x3 Psych: tearful. Otherwise, Normal affect, dress and demeanor. Normal speech. Normal thought content and judgment. No SI or HI.  No exam data present No results found. No results found for this or any previous visit (from the past 24 hour(s)).  Assessment/Plan: Cathy [name] is a 53 y.o. female present for OV for  Nonintractable headache, unspecified chronicity pattern, unspecified headache type/fatigue - headaches improved some with B12 injections.  - likely multifactorial with b12 deficiency and depression/anxiety. Will rule out AA and vit d as potential causes as well since fatigue continues.  - CBC, Comp Met (CMET, Magnesium,TSH--> Normal - b12- was 164. She has been receiving B12  injections. Since labs 12/23/2018. - Vitamin D (25 hydroxy) - ANA,IFA RA Diag Pnl w/rflx Tit/Patn  Depression, recurrent (HCC) - recurrent for her.  - Family services of the [employer] provided. She will also look into a telehealth psychologist available in [place].  - 24 hour crisis line information provided to her.  - start effexor taper.  - encouraged her to speak to her employer. She is overwhelmed at work and working up to 20 hours a day by her reports. If help is not provided she may need to consider another job.  - f/u 4 weeks- she has an appt already set up at that time.  B12 deficiency - has been receiving shots since 12/24/2018. Retest levels today - B12 - CBC, Comp Met (CMET, Magnesium,TSH--> Normal - b12- was 164. She has been receiving B12 injections. Since labs 12/23/2018.   Reviewed expectations re: course of current medical issues.  Discussed self-management of symptoms.  Outlined signs and symptoms indicating need for more acute intervention.  Patient verbalized understanding and all questions were answered.  Patient received an After-Visit Summary.   Orders Placed This Encounter  Procedures  . B12  . Vitamin D (25 hydroxy)  . ANA,IFA RA Diag Pnl w/rflx Tit/Patn    > 25 minutes spent with patient, >50% of time spent face to face counseling     Note is dictated utilizing voice recognition software. Although note has been proof read prior to signing, occasional typographical errors still can be missed. If any questions arise, please do not hesitate to call for verification.   electronically signed by:  Howard Pouch, DO  [place]

## 2019-03-03 ENCOUNTER — Telehealth: Payer: Self-pay | Admitting: *Deleted

## 2019-03-03 NOTE — Telephone Encounter (Signed)
Called pt to give lab results. She asked that I send a letter to Dr. Raoul Pitch asking if she could write a letter to pts employer about the number of hours they have her working. She stated that she works 15-18 hours a day x 7 days a week. She stated that she feels this is part of the cause of her symptoms. She doesn't want to be taken out of work but would like something to recommend limited hours per day. She stated that she did speak with her employer about this like Dr. Raoul Pitch asked but they did not care. Please advise. Thanks.

## 2019-03-03 NOTE — Telephone Encounter (Signed)
Unfortunately, there is not much I can do as her doctor since she is able to work full time- just not overtime as she has been required by her employer. It seems the problem is with her employer and their expectations.

## 2019-03-04 LAB — ANA,IFA RA DIAG PNL W/RFLX TIT/PATN
Anti Nuclear Antibody(ANA): NEGATIVE
Cyclic Citrullin Peptide Ab: <16 UNITS
Rheumatoid fact SerPl-aCnc: <14 IU/mL (ref <14)

## 2019-03-06 NOTE — Telephone Encounter (Signed)
Spoke with pt and advised that a letter of that type would fall into a disability category for a salaried employee. She understood and appreciated the call back.

## 2019-03-12 ENCOUNTER — Other Ambulatory Visit: Payer: Self-pay

## 2019-03-12 ENCOUNTER — Ambulatory Visit (INDEPENDENT_AMBULATORY_CARE_PROVIDER_SITE_OTHER): Payer: BLUE CROSS/BLUE SHIELD | Admitting: *Deleted

## 2019-03-12 DIAGNOSIS — E538 Deficiency of other specified B group vitamins: Secondary | ICD-10-CM | POA: Diagnosis not present

## 2019-03-12 NOTE — Progress Notes (Addendum)
Cathy [name] is a 53 y.o. female presents to the office today for 4:4 weekly injections, per physician's orders. Original order: 12/25/17-Please schedule her for nurse visit for B12 1000 mcg injection once weekly for 4 doses, then every 2 weeks for 4 doses-follow-up with her provider in 3 months with recheck.  Cyanocobalamin, 1058mcg, IM was administered Left deltoid today.  Pt tolerated injection well, given without incident or problem. Patient left without complaint.  Patient due for follow up labs/provider appt: No. Date due: 03/27/19, appt made: Yes Patient next injection due: 03/26/19, appt made: Yes (03/27/19)  Cathy [name]  ADDENDUM: Pt with vit B12 deficiency.  Agree with vit B12 1000 mcg IM in office today. Signed:  Crissie Sickles, MD           03/25/2019

## 2019-03-13 ENCOUNTER — Ambulatory Visit (INDEPENDENT_AMBULATORY_CARE_PROVIDER_SITE_OTHER): Payer: BLUE CROSS/BLUE SHIELD | Admitting: Family Medicine

## 2019-03-13 ENCOUNTER — Encounter: Payer: Self-pay | Admitting: Family Medicine

## 2019-03-13 VITALS — BP 118/70 | HR 57 | Temp 97.7°F | Resp 16 | Ht 61.0 in | Wt 169.0 lb

## 2019-03-13 DIAGNOSIS — R300 Dysuria: Secondary | ICD-10-CM

## 2019-03-13 DIAGNOSIS — N390 Urinary tract infection, site not specified: Secondary | ICD-10-CM | POA: Diagnosis not present

## 2019-03-13 DIAGNOSIS — R350 Frequency of micturition: Secondary | ICD-10-CM

## 2019-03-13 DIAGNOSIS — K5900 Constipation, unspecified: Secondary | ICD-10-CM

## 2019-03-13 LAB — POC URINALSYSI DIPSTICK (AUTOMATED)
BILIRUBIN UA: NEGATIVE
Blood, UA: NEGATIVE
Glucose, UA: NEGATIVE
Ketones, UA: NEGATIVE
Leukocytes, UA: NEGATIVE
NITRITE UA: NEGATIVE
Protein, UA: NEGATIVE
Spec Grav, UA: <=1.005 — AB (ref 1.010–1.025)
Urobilinogen, UA: 0.2 E.U./dL
pH, UA: 6 (ref 5.0–8.0)

## 2019-03-13 MED ORDER — CEPHALEXIN 500 MG PO CAPS: 500.0000 mg | ORAL_CAPSULE | Freq: Four times a day (QID) | ORAL | 0 refills | Status: DC

## 2019-03-13 NOTE — Patient Instructions (Signed)
Keflex prescribed every 6 hours for 7 days.  HYDRATE-- flush those kidneys. For bowel movement--> add 1 cap of miralax to 8 ounces of water.     We will call you with urine culture results one received.

## 2019-03-13 NOTE — Progress Notes (Signed)
Cathy [name] , 03-Dec-1966, 53 y.o., female MRN: [id number] Patient Care Team    Relationship Specialty Notifications Start End  Ma Hillock, DO PCP - General Family Medicine  10/07/17   Lorretta Harp, MD Consulting Physician Cardiology  09/27/17   Paula Compton, MD Consulting Physician Obstetrics and Gynecology  09/27/17     Chief Complaint  Patient presents with  . Urinary Frequency    x5 days  . Abdominal Pain     Subjective: Pt presents for an OV with complaints of urinary frequency  of 5 days duration.  Associated symptoms include abdominal discomfort and dysuria.  H/o frequent UTI- last in system Klebs- pan-sensitive except ampicillin. Pt has tried nothing to ease their symptoms. She denies fever, chills, nausea or back pain. She does endorse constipation.   Depression screen [facility] 2/9 03/02/2019 08/26/2018 09/27/2017  Decreased Interest 2 0 0  Down, Depressed, Hopeless 3 0 1  PHQ - 2 Score 5 0 1  Altered sleeping 2 - -  Tired, decreased energy 2 - -  Change in appetite 2 - -  Feeling bad or failure about yourself  3 - -  Trouble concentrating 2 - -  Moving slowly or fidgety/restless 1 - -  Suicidal thoughts 0 - -  PHQ-9 Score 17 - -  Difficult doing work/chores Very difficult - -    Allergies  Allergen Reactions  . Macrobid [Nitrofurantoin Monohyd Macro] Anaphylaxis   Social History   Social History Narrative   Divorced. 3 children. Bachelors degree. Works as an Optometrist.   Exercises routinely.   Wears her seatbelt. Wears a bicycle helmet. Smoke detector in the home.   Feels safe in her relationships.   Past Medical History:  Diagnosis Date  . Depression   . Dyspnea on exertion    With atypical chest pain, followed by cardiology.  . Genital warts 70/6237   TCA application by GYn  . History of frequent urinary tract infections   . Juvenile epilepsy ([place])   . Migraines   . Seizures ([place])    none since 1987- classified as epileptic seizures  .  Thrombosed external hemorrhoid    Past Surgical History:  Procedure Laterality Date  . BILATERAL SALPINGECTOMY Bilateral 12/20/2014   Procedure: BILATERAL SALPINGECTOMY;  Surgeon: Logan Bores, MD;  Location: [facility];  Service: Gynecology;  Laterality: Bilateral;  . HEMORRHOID SURGERY     thrombosed hemorrhoid  . LAPAROSCOPIC ASSISTED VAGINAL HYSTERECTOMY N/A 12/20/2014   Procedure: LAPAROSCOPIC ASSISTED VAGINAL HYSTERECTOMY;  Surgeon: Logan Bores, MD;  Location: [facility];  Service: Gynecology;  Laterality: N/A;  . tendon repair Right 2011   right ankle  . TONSILLECTOMY  1973   Family History  Problem Relation Age of Onset  . Hyperlipidemia Mother   . Heart disease Mother   . Hypertension Mother   . Ovarian cancer Mother   . Cervical cancer Mother   . Heart attack Mother   . Depression Sister   . Asthma Sister   . Asthma Son   . Dementia Maternal Grandmother   . Heart attack Maternal Grandmother   . Heart attack Maternal Grandfather   . Heart attack Paternal Grandmother    Allergies as of 03/13/2019      Reactions   Macrobid [nitrofurantoin Monohyd Macro] Anaphylaxis      Medication List       Accurate as of March 13, 2019 10:01 AM. Always use your most recent med list.  SUMAtriptan 50 MG tablet Commonly known as:  Imitrex Take 1 tablet (50 mg total) by mouth every 2 (two) hours as needed for migraine. May repeat in 2 hours if headache persists or recurs.   venlafaxine XR 37.5 MG 24 hr capsule Commonly known as:  Effexor XR Take 1 capsule (37.5 mg total) by mouth daily with breakfast.       All past medical history, surgical history, allergies, family history, immunizations andmedications were updated in the EMR today and reviewed under the history and medication portions of their EMR.     ROS: Negative, with the exception of above mentioned in HPI   Objective:  BP 118/70 (BP Location: Left Arm, Patient Position: Sitting, Cuff Size: Normal)    Pulse (!) 57   Temp 97.7 F (36.5 C) (Oral)   Resp 16   Ht 5\' 1"  (1.549 m)   Wt 169 lb (76.7 kg)   LMP 11/19/2014   SpO2 99%   BMI 31.93 kg/m  Body mass index is 31.93 kg/m. Gen: Afebrile. No acute distress. Nontoxic in appearance, well developed, well nourished.  HENT: AT. [place]. MMM Eyes:Pupils Equal Round Reactive to light, Extraocular movements intact,  Conjunctiva without redness, discharge or icterus. CV: RRR  Abd: Soft.  NTND. BS present. No Masses palpated. No rebound or guarding.  MSK: no CVA tenderness Skin: no rashes, purpura or petechiae.  Neuro: Normal gait. PERLA. EOMi. Alert. Oriented x3  CNo exam data present No results found. Results for orders placed or performed in visit on 03/13/19 (from the past 24 hour(s))  POCT Urinalysis Dipstick (Automated)     Status: Abnormal   Collection Time: 03/13/19  9:53 AM  Result Value Ref Range   Color, UA very light yellow - clear    Clarity, UA clear    Glucose, UA Negative Negative   Bilirubin, UA negative    Ketones, UA negative    Spec Grav, UA <=1.005 (A) 1.010 - 1.025   Blood, UA negative    pH, UA 6.0 5.0 - 8.0   Protein, UA Negative Negative   Urobilinogen, UA 0.2 0.2 or 1.0 E.U./dL   Nitrite, UA negative    Leukocytes, UA Negative Negative    Assessment/Plan: Cathy [name] is a 53 y.o. female present for OV for  Urinary frequency/Dysuria/Frequent UTI - sx consistent with prior UTI- prophylactic treat - culture sent.  - start keflex QID.  - POCT Urinalysis Dipstick (Automated)- negative - Urine Culture to be complete Constipation, unspecified constipation type - increase water consumption nad add 1 cap miralax daily. Discussed constipation can cause UTI like symptoms (not usually dysuria though) - f/u PRN  Reviewed expectations re: course of current medical issues.  Discussed self-management of symptoms.  Outlined signs and symptoms indicating need for more acute intervention.  Patient verbalized  understanding and all questions were answered.  Patient received an After-Visit Summary.    Orders Placed This Encounter  Procedures  . POCT Urinalysis Dipstick (Automated)     Note is dictated utilizing voice recognition software. Although note has been proof read prior to signing, occasional typographical errors still can be missed. If any questions arise, please do not hesitate to call for verification.   electronically signed by:  Howard Pouch, DO  [place]

## 2019-03-14 LAB — URINE CULTURE
MICRO NUMBER: 341708
SPECIMEN QUALITY:: ADEQUATE

## 2019-03-27 ENCOUNTER — Ambulatory Visit: Payer: BLUE CROSS/BLUE SHIELD | Admitting: Family Medicine

## 2019-03-27 ENCOUNTER — Other Ambulatory Visit: Payer: Self-pay

## 2019-03-29 NOTE — Progress Notes (Signed)
Pt with vit B12 deficiency.  Agree with vit B12 1000 mcg IM in office today. Signed:  Crissie Sickles, MD           03/29/2019

## 2019-04-15 ENCOUNTER — Other Ambulatory Visit: Payer: Self-pay

## 2019-04-15 ENCOUNTER — Encounter: Payer: Self-pay | Admitting: Family Medicine

## 2019-04-15 ENCOUNTER — Telehealth: Payer: Self-pay

## 2019-04-15 ENCOUNTER — Ambulatory Visit (INDEPENDENT_AMBULATORY_CARE_PROVIDER_SITE_OTHER): Payer: BLUE CROSS/BLUE SHIELD | Admitting: Family Medicine

## 2019-04-15 VITALS — Ht 61.0 in | Wt 163.0 lb

## 2019-04-15 DIAGNOSIS — F339 Major depressive disorder, recurrent, unspecified: Secondary | ICD-10-CM

## 2019-04-15 DIAGNOSIS — G4452 New daily persistent headache (NDPH): Secondary | ICD-10-CM | POA: Diagnosis not present

## 2019-04-15 MED ORDER — VENLAFAXINE HCL ER 75 MG PO CP24: 75.0000 mg | ORAL_CAPSULE | Freq: Every day | ORAL | 1 refills | Status: DC

## 2019-04-15 MED ORDER — SUMATRIPTAN SUCCINATE 50 MG PO TABS: 50.0000 mg | ORAL_TABLET | ORAL | 11 refills | Status: DC | PRN

## 2019-04-15 MED ORDER — CYANOCOBALAMIN 1000 MCG/ML IJ SOLN: 1000.0000 ug | INTRAMUSCULAR | 11 refills | Status: DC

## 2019-04-15 NOTE — Progress Notes (Signed)
VIRTUAL VISIT VIA VIDEO  I connected with Cathy [name] on 04/15/19 at  1:00 PM EDT by a video enabled telemedicine application and verified that I am speaking with the correct person using two identifiers. Location patient: Home Location provider: [facility], Office Persons participating in the virtual visit: Patient, Dr. Raoul Pitch and R.Baker, LPN  I discussed the limitations of evaluation and management by telemedicine and the availability of in person appointments. The patient expressed understanding and agreed to proceed.   SUBJECTIVE Chief Complaint  Patient presents with  . Follow-up    For B12 injections. Pt is unable to get vital signs   . Headache    Pt complains headaches have came back the past 4 weeks.     HPI:  Depression/anxiety: It is present today to follow-up on her depression anxiety after tapering on Effexor 4 weeks ago.  She reports she is tolerating the medicine well and has tapered up to Effexor 75 mg daily.  She does feel that it is starting to help her with her depression and anxiety.  She is tolerating the medication without side effects. Chronic headache: She reports her headaches have returned approximately 2 to 3 weeks.  She reports it is the same doll mild headache and over the weekend the sharp shooting pain through the top of her head with numbness and tingling towards her nose reoccurred.  She reported that her headaches were completely improved at the time we were doing B12 injections.  Injections stopped 03/12/2019 and her levels were in normal range.  Reports she has been taking oral B12. CBC, CMP, TSH, magnesium, ANA-all normal. B12 was approximately 150 prior to the start of injections.  Letter than 700 with every 14-day B12 injection. MND mildly low at 27, now supplementing Prior note: Pt presents for an OV with complaints of headache of 2 weeks duration.  SHe was seen by another provider on December 23 for this condition, that started Dec.  20th and encouraged to report to the emergency room for alarming symptoms with her headache.  She states she did not report to the emergency room as instructed 2/2 financial limitations.  Presents today secondary to her headache has yet to resolve.  Associated symptoms with her headache has been vomiting initially, which has resolved-but nausea remains.  Watery diarrhea occurring daily, worse when she eats that has been present since onset of headache- but absent for last 18 hours.  SHe reports the actual head pain has improved a fraction since being seen on the 23rd it was a "constant pounding pain" but has remained a constant pressure pain around her eyes and occipital area.  Reports pain in bilateral ears and nausea remains.  She endorses feeling mildly lightheaded or "foggy "   She has had a history of migraines that used to occur frequently many years ago.  She states she has not really had problems with her migraines in the last decade.  Used to take Imitrex when she did have migraines.  Currently she has been taking NSAIDs and Excedrin Migraine to help with her headaches.  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  Normal MRI in 2002-completed secondary to headaches and history of right retinal toxoplasmosis She denies fevers or chills during the last 2 weeks.  SHe denies illness prior to onset of headache.  She denies injury or head trauma.  She denies slurred speech, difficulty swallowing, ringing of her ears,  visual changes, unilateral muscle weakness, unsteady gait.  She states that the headache and symptoms started abruptly.  No exposure to sick contacts, under prepared food, antibiotics or travel outside the [place]. ROS: See pertinent positives and negatives per HPI.  Patient Active Problem List   Diagnosis Date Noted  . Depression, recurrent ([place]) 03/02/2019  . Other fatigue 03/02/2019  . Headache, new daily persistent ([place])  12/23/2018  . Diarrhea 12/23/2018  . Nausea 12/23/2018  . Obesity (BMI 30-39.9) 08/26/2018  . Family history of ovarian cancer 09/30/2017  . Hemorrhoids 09/27/2017  . 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 Smoking status: Never Smoker  . Smokeless tobacco: Never Used  Substance Use Topics  . Alcohol use: Yes    Alcohol/week: 1.0 standard drinks    Types: 1 Standard drinks or equivalent per week    Comment: socially    Current Outpatient Medications:  .  venlafaxine XR (EFFEXOR XR) 37.5 MG 24 hr capsule, Take 1 capsule (37.5 mg total) by mouth daily with breakfast., Disp: 53 capsule, Rfl: 0 .  SUMAtriptan (IMITREX) 50 MG tablet, Take 1 tablet (50 mg total) by mouth every 2 (two) hours as needed for migraine. May repeat in 2 hours if headache persists or recurs. (Patient not taking: Reported on 04/15/2019), Disp: 10 tablet, Rfl: 0  Current Facility-Administered Medications:  .  cyanocobalamin ((VITAMIN B-12)) injection 1,000 mcg, 1,000 mcg, Intramuscular, Once, Raynelle Fujikawa A, DO  Allergies  Allergen Reactions  . Macrobid [Nitrofurantoin Monohyd Macro] Anaphylaxis    OBJECTIVE: Ht _0  (1.549 m)   Wt 163 lb (73.9 kg)   LMP 11/19/2014   BMI 30.80 kg/m  Gen: No acute distress. Nontoxic in appearance.  Working on her computer during virtual visit. HENT: AT. [place].  MMM.  Eyes:Pupils Equal Round Reactive to light, Extraocular movements intact,  Conjunctiva without redness, discharge or icterus. Chest: Cough or shortness of breath not present Neuro:  Normal gait. Alert. Oriented x3  Psych: Normal affect, dress and demeanor. Normal speech. Normal thought content and judgment.  ASSESSMENT AND PLAN: Cathy [name] is a 53 y.o. female present for  Nonintractable headache, unspecified chronicity pattern, unspecified headache type/fatigue/B12 -Patient reports headaches have returned since stopping B12 injections.  She does not appear in any discomfort  today during virtual visit, working on the computer during virtual visit. - headaches improved with B12 injections.  Returned after B12 injections discontinued.  Therefore will restart B12 injections in which she can complete at home every 2 weeks. - CBC, Comp Met (CMET, Magnesium,TSH--> Normal - [id number] with injections every 14 days. - Vitamin D-03/02/2019 very mildly low at 27 she is supplementing in her diet. - ANA,IFA RA Diag Pnl w/rflx Tit/Patn-negative ANA -Refilled Imitrex for her today. -Discussed with her today that if her headaches do not resolve with the restart of B12 injections within 4 weeks, or they are worsening, I would recommend she follow-up immediately and we would need to obtain an MRI, discuss preventative meds and consider neurology referral.  Depression, recurrent ([place]) - Improved.  Continue Effexor 75 mg daily, refills provided today. - Family services of the [employer] provided. She will also look into a telehealth psychologist available in [place].  - 24 hour crisis line information provided to her.   -Follow-up 6 months, unless needed sooner.  Follow-up 6 months, unless needed sooner.  If headaches remain after restarting B12 injections follow-up in 4 weeks.    Howard Pouch,  DO 04/15/2019

## 2019-04-15 NOTE — Telephone Encounter (Signed)
Pt is asking for letter stating she has Asthma. Pt was called to ask what the letter needed to say and what it was for. VM was full and could not leave message.

## 2019-04-15 NOTE — Patient Instructions (Signed)
Restart B12 injections, this was called in for you to complete every 14 days at home.  If headache does not resolve within 2 to 4 weeks or if headaches are worsening, he will need to be seen again so that we can consider neurological referral or brain imaging.  Refilled the Effexor today, Effexor 75 mg daily daily dose, new pill is 75 mg of pill, therefore only take 1.   If conditions are stable, follow-up every 6 months, otherwise sooner.

## 2019-04-15 NOTE — Telephone Encounter (Signed)
Pt stated she has spoken with her administrator and she no longer needs any kind of letter written

## 2019-04-21 ENCOUNTER — Telehealth: Payer: Self-pay

## 2019-04-21 MED ORDER — "SYRINGE 21G X 1"" 3 ML MISC": 1.0000 mL | 0 refills | Status: DC

## 2019-04-21 NOTE — Telephone Encounter (Signed)
Faxed paper request sent by [place] requesting syringes for B12 inj. Syringes sent to pharmacy

## 2019-05-10 ENCOUNTER — Other Ambulatory Visit: Payer: Self-pay | Admitting: Family Medicine

## 2019-07-01 DIAGNOSIS — F329 Major depressive disorder, single episode, unspecified: Secondary | ICD-10-CM | POA: Diagnosis not present

## 2019-08-05 ENCOUNTER — Telehealth: Payer: Self-pay | Admitting: Family Medicine

## 2019-08-05 NOTE — Telephone Encounter (Signed)
Patient has been scheduled for 08/07/19.

## 2019-08-05 NOTE — Telephone Encounter (Signed)
Patient is in pain with a hemorrhoid. OTC medication is not working.  Patient requested an appointment. Advised patient Dr. Raoul Pitch is out of the office and will be back in contact with her 08/06/19.  First available in office appointment available with PCP 08/17/19.

## 2019-08-05 NOTE — Telephone Encounter (Signed)
There is a same day appt on Friday at 2:30 she can placed in. If she feels she needs to be seen before then we can see if another MD can see her.

## 2019-08-07 ENCOUNTER — Ambulatory Visit (INDEPENDENT_AMBULATORY_CARE_PROVIDER_SITE_OTHER): Payer: BC Managed Care – PPO | Admitting: Family Medicine

## 2019-08-07 ENCOUNTER — Encounter: Payer: Self-pay | Admitting: Family Medicine

## 2019-08-07 ENCOUNTER — Other Ambulatory Visit: Payer: Self-pay

## 2019-08-07 VITALS — BP 120/74 | HR 57 | Temp 98.2°F | Resp 17 | Ht 61.0 in | Wt 175.0 lb

## 2019-08-07 DIAGNOSIS — K649 Unspecified hemorrhoids: Secondary | ICD-10-CM

## 2019-08-07 MED ORDER — HYDROCORTISONE 1 % EX CREA: 1.0000 "application " | TOPICAL_CREAM | Freq: Two times a day (BID) | CUTANEOUS | 0 refills | Status: DC

## 2019-08-07 MED ORDER — HYDROCORTISONE ACETATE 25 MG RE SUPP: 25.0000 mg | Freq: Two times a day (BID) | RECTAL | 0 refills | Status: DC

## 2019-08-07 NOTE — Progress Notes (Signed)
Cathy [name] , Mar 31, 1966, 53 y.o., female MRN: [id number] Patient Care Team    Relationship Specialty Notifications Start End  Cathy Hillock, DO PCP - General Family Medicine  10/07/17   Cathy Harp, MD Consulting Physician Cardiology  09/27/17   Cathy Compton, MD Consulting Physician Obstetrics and Gynecology  09/27/17     Chief Complaint  Patient presents with  . Hemorrhoids    external. x1wk. pain and itching. no bleeding. laying down helps. sitting makers her uncomfortable. using OTC's     Subjective: Pt presents for an OV with complaints of rectal pain and itching of 1 week duration.  She reports it is very uncomfortable for sitting and her hemorrhoids are rather enlarged.  She denies any constipation.  She states her stool has not been an issue and does not cause her pain.  She has not been constipated.  She has not seen any bloody stools.  She has tried over-the-counter Preparation H which has not helped her condition.  Depression screen [name] 2/9 03/02/2019 08/26/2018 09/27/2017  Decreased Interest 2 0 0  Down, Depressed, Hopeless 3 0 1  PHQ - 2 Score 5 0 1  Altered sleeping 2 - -  Tired, decreased energy 2 - -  Change in appetite 2 - -  Feeling bad or failure about yourself  3 - -  Trouble concentrating 2 - -  Moving slowly or fidgety/restless 1 - -  Suicidal thoughts 0 - -  PHQ-9 Score 17 - -  Difficult doing work/chores Very difficult - -    Allergies  Allergen Reactions  . Macrobid [Nitrofurantoin Monohyd Macro] Anaphylaxis   Social History   Social History Narrative   Divorced. 3 children. Bachelors degree. Works as an Optometrist.   Exercises routinely.   Wears her seatbelt. Wears a bicycle helmet. Smoke detector in the home.   Feels safe in her relationships.   Past Medical History:  Diagnosis Date  . Depression   . Dyspnea on exertion    With atypical chest pain, followed by cardiology.  . Genital warts 75/1025   TCA application by GYn  .  History of frequent urinary tract infections   . Juvenile epilepsy ([place])   . Migraines   . Seizures ([place])    none since 1987- classified as epileptic seizures  . Thrombosed external hemorrhoid    Past Surgical History:  Procedure Laterality Date  . BILATERAL SALPINGECTOMY Bilateral 12/20/2014   Procedure: BILATERAL SALPINGECTOMY;  Surgeon: Cathy Bores, MD;  Location: [facility];  Service: Gynecology;  Laterality: Bilateral;  . HEMORRHOID SURGERY     thrombosed hemorrhoid  . LAPAROSCOPIC ASSISTED VAGINAL HYSTERECTOMY N/A 12/20/2014   Procedure: LAPAROSCOPIC ASSISTED VAGINAL HYSTERECTOMY;  Surgeon: Cathy Bores, MD;  Location: [facility];  Service: Gynecology;  Laterality: N/A;  . tendon repair Right 2011   right ankle  . TONSILLECTOMY  1973   Family History  Problem Relation Age of Onset  . Hyperlipidemia Mother   . Heart disease Mother   . Hypertension Mother   . Ovarian cancer Mother   . Cervical cancer Mother   . Heart attack Mother   . Depression Sister   . Asthma Sister   . Asthma Son   . Dementia Maternal Grandmother   . Heart attack Maternal Grandmother   . Heart attack Maternal Grandfather   . Heart attack Paternal Grandmother    Allergies as of 08/07/2019      Reactions   Macrobid [nitrofurantoin [employer]  Macro] Anaphylaxis      Medication List       Accurate as of August 07, 2019  2:36 PM. If you have any questions, ask your nurse or doctor.        cyanocobalamin 1000 MCG/ML injection Commonly known as: (VITAMIN B-12) Inject 1 mL (1,000 mcg total) into the muscle every 14 (fourteen) days.   SUMAtriptan 50 MG tablet Commonly known as: Imitrex Take 1 tablet (50 mg total) by mouth every 2 (two) hours as needed for migraine. May repeat in 2 hours if headache persists or recurs.   SYRINGE 3CC/21GX1" 21G X 1" 3 ML Misc Inject 1 mL into the muscle every 14 (fourteen) days. Pt to inject 14mL every 14 days intramuscular DX  E53.8   venlafaxine XR 75 MG 24  hr capsule Commonly known as: Effexor XR Take 1 capsule (75 mg total) by mouth daily with breakfast.       All past medical history, surgical history, allergies, family history, immunizations andmedications were updated in the EMR today and reviewed under the history and medication portions of their EMR.     ROS: Negative, with the exception of above mentioned in HPI  Objective:  BP 120/74 (BP Location: Left Arm, Patient Position: Sitting, Cuff Size: Normal)   Pulse (!) 57   Temp 98.2 F (36.8 C) (Temporal)   Resp 17   Ht 5\' 1"  (1.549 m)   Wt 175 lb (79.4 kg)   LMP 11/19/2014   SpO2 98%   BMI 33.07 kg/m  Body mass index is 33.07 kg/m. Gen: Afebrile. No acute distress. Nontoxic in appearance, well developed, well nourished.  Abd: Soft. NTND. BS + Neuro: Normal gait. PERLA. EOMi. Alert. Oriented x3  Rectal exam: negative without mass, lesions or tenderness, tenderness noted at x2 hemorrhoid, external hemorrhoids noted.   No exam data present No results found. No results found for this or any previous visit (from the past 24 hour(s)).  Assessment/Plan: Cathy [name] is a 53 y.o. female present for OV for  Hemorrhoids, unspecified hemorrhoid type Two rather large hemorrhoids present.  Do not appear thrombosed today.  Patient was told to monitor and if any signs and symptoms of infection with fever, increased pain, hemorrhoids enlarging she is to be seen emergently over the weekend.  Otherwise we will treat as we did last time for her hemorrhoids with suppositories and steroid cream. -Patient was encouraged to soak in warm sitz bath - hydrocortisone (ANUSOL-HC) 25 MG suppository; Place 1 suppository (25 mg total) rectally 2 (two) times daily.  Dispense: 12 suppository; Refill: 0 - hydrocortisone cream 1 %; Apply 1 application topically 2 (two) times daily.  Dispense: 30 g; Refill: 0 Follow-up 1 to 2 weeks if not improved.   Reviewed expectations re: course of current medical  issues.  Discussed self-management of symptoms.  Outlined signs and symptoms indicating need for more acute intervention.  Patient verbalized understanding and all questions were answered.  Patient received an After-Visit Summary.    No orders of the defined types were placed in this encounter.    Note is dictated utilizing voice recognition software. Although note has been proof read prior to signing, occasional typographical errors still can be missed. If any questions arise, please do not hesitate to call for verification.   electronically signed by:  Howard Pouch, DO  [place]

## 2019-08-07 NOTE — Patient Instructions (Signed)

## 2019-12-07 ENCOUNTER — Other Ambulatory Visit: Payer: Self-pay

## 2019-12-07 DIAGNOSIS — Z20822 Contact with and (suspected) exposure to covid-19: Secondary | ICD-10-CM

## 2019-12-10 LAB — NOVEL CORONAVIRUS, NAA: SARS-CoV-2, NAA: NOT DETECTED

## 2020-01-18 DIAGNOSIS — Z03818 Encounter for observation for suspected exposure to other biological agents ruled out: Secondary | ICD-10-CM | POA: Diagnosis not present

## 2020-01-18 DIAGNOSIS — Z20828 Contact with and (suspected) exposure to other viral communicable diseases: Secondary | ICD-10-CM | POA: Diagnosis not present

## 2020-02-23 DIAGNOSIS — F411 Generalized anxiety disorder: Secondary | ICD-10-CM | POA: Diagnosis not present

## 2020-02-23 DIAGNOSIS — F329 Major depressive disorder, single episode, unspecified: Secondary | ICD-10-CM | POA: Diagnosis not present

## 2020-03-19 DIAGNOSIS — F329 Major depressive disorder, single episode, unspecified: Secondary | ICD-10-CM | POA: Diagnosis not present

## 2020-03-19 DIAGNOSIS — F411 Generalized anxiety disorder: Secondary | ICD-10-CM | POA: Diagnosis not present

## 2020-03-29 DIAGNOSIS — Z01419 Encounter for gynecological examination (general) (routine) without abnormal findings: Secondary | ICD-10-CM | POA: Diagnosis not present

## 2020-03-29 DIAGNOSIS — E538 Deficiency of other specified B group vitamins: Secondary | ICD-10-CM | POA: Diagnosis not present

## 2020-03-29 DIAGNOSIS — N951 Menopausal and female climacteric states: Secondary | ICD-10-CM | POA: Diagnosis not present

## 2020-03-29 DIAGNOSIS — Z130 Encounter for screening for diseases of the blood and blood-forming organs and certain disorders involving the immune mechanism: Secondary | ICD-10-CM | POA: Diagnosis not present

## 2020-03-29 DIAGNOSIS — Z6833 Body mass index (BMI) 33.0-33.9, adult: Secondary | ICD-10-CM | POA: Diagnosis not present

## 2020-03-29 DIAGNOSIS — Z0000 Encounter for general adult medical examination without abnormal findings: Secondary | ICD-10-CM | POA: Diagnosis not present

## 2020-03-29 DIAGNOSIS — Z1389 Encounter for screening for other disorder: Secondary | ICD-10-CM | POA: Diagnosis not present

## 2020-03-29 DIAGNOSIS — Z1231 Encounter for screening mammogram for malignant neoplasm of breast: Secondary | ICD-10-CM | POA: Diagnosis not present

## 2020-03-29 LAB — HM MAMMOGRAPHY

## 2020-03-30 DIAGNOSIS — H20011 Primary iridocyclitis, right eye: Secondary | ICD-10-CM | POA: Diagnosis not present

## 2020-04-01 DIAGNOSIS — H20011 Primary iridocyclitis, right eye: Secondary | ICD-10-CM | POA: Diagnosis not present

## 2020-04-02 DIAGNOSIS — F411 Generalized anxiety disorder: Secondary | ICD-10-CM | POA: Diagnosis not present

## 2020-04-02 DIAGNOSIS — F329 Major depressive disorder, single episode, unspecified: Secondary | ICD-10-CM | POA: Diagnosis not present

## 2020-04-08 DIAGNOSIS — H209 Unspecified iridocyclitis: Secondary | ICD-10-CM | POA: Diagnosis not present

## 2020-04-11 DIAGNOSIS — F411 Generalized anxiety disorder: Secondary | ICD-10-CM | POA: Diagnosis not present

## 2020-04-11 DIAGNOSIS — F329 Major depressive disorder, single episode, unspecified: Secondary | ICD-10-CM | POA: Diagnosis not present

## 2020-05-06 DIAGNOSIS — H209 Unspecified iridocyclitis: Secondary | ICD-10-CM | POA: Diagnosis not present

## 2020-05-09 DIAGNOSIS — F411 Generalized anxiety disorder: Secondary | ICD-10-CM | POA: Diagnosis not present

## 2020-05-09 DIAGNOSIS — F329 Major depressive disorder, single episode, unspecified: Secondary | ICD-10-CM | POA: Diagnosis not present

## 2020-12-05 DIAGNOSIS — F329 Major depressive disorder, single episode, unspecified: Secondary | ICD-10-CM | POA: Diagnosis not present

## 2020-12-05 DIAGNOSIS — F411 Generalized anxiety disorder: Secondary | ICD-10-CM | POA: Diagnosis not present

## 2020-12-12 DIAGNOSIS — F329 Major depressive disorder, single episode, unspecified: Secondary | ICD-10-CM | POA: Diagnosis not present

## 2020-12-12 DIAGNOSIS — F411 Generalized anxiety disorder: Secondary | ICD-10-CM | POA: Diagnosis not present

## 2020-12-20 DIAGNOSIS — F411 Generalized anxiety disorder: Secondary | ICD-10-CM | POA: Diagnosis not present

## 2020-12-20 DIAGNOSIS — F329 Major depressive disorder, single episode, unspecified: Secondary | ICD-10-CM | POA: Diagnosis not present

## 2021-01-04 DIAGNOSIS — F411 Generalized anxiety disorder: Secondary | ICD-10-CM | POA: Diagnosis not present

## 2021-01-04 DIAGNOSIS — F329 Major depressive disorder, single episode, unspecified: Secondary | ICD-10-CM | POA: Diagnosis not present

## 2021-02-01 DIAGNOSIS — F411 Generalized anxiety disorder: Secondary | ICD-10-CM | POA: Diagnosis not present

## 2021-02-01 DIAGNOSIS — F329 Major depressive disorder, single episode, unspecified: Secondary | ICD-10-CM | POA: Diagnosis not present

## 2021-02-15 DIAGNOSIS — F411 Generalized anxiety disorder: Secondary | ICD-10-CM | POA: Diagnosis not present

## 2021-02-15 DIAGNOSIS — F329 Major depressive disorder, single episode, unspecified: Secondary | ICD-10-CM | POA: Diagnosis not present

## 2021-03-15 DIAGNOSIS — F411 Generalized anxiety disorder: Secondary | ICD-10-CM | POA: Diagnosis not present

## 2021-03-15 DIAGNOSIS — F329 Major depressive disorder, single episode, unspecified: Secondary | ICD-10-CM | POA: Diagnosis not present

## 2021-03-29 DIAGNOSIS — Z1231 Encounter for screening mammogram for malignant neoplasm of breast: Secondary | ICD-10-CM | POA: Diagnosis not present

## 2021-03-29 DIAGNOSIS — Z13220 Encounter for screening for lipoid disorders: Secondary | ICD-10-CM | POA: Diagnosis not present

## 2021-03-29 DIAGNOSIS — N951 Menopausal and female climacteric states: Secondary | ICD-10-CM | POA: Diagnosis not present

## 2021-03-29 DIAGNOSIS — Z01419 Encounter for gynecological examination (general) (routine) without abnormal findings: Secondary | ICD-10-CM | POA: Diagnosis not present

## 2021-03-29 DIAGNOSIS — Z6834 Body mass index (BMI) 34.0-34.9, adult: Secondary | ICD-10-CM | POA: Diagnosis not present

## 2021-03-29 DIAGNOSIS — Z1389 Encounter for screening for other disorder: Secondary | ICD-10-CM | POA: Diagnosis not present

## 2021-03-29 DIAGNOSIS — Z1321 Encounter for screening for nutritional disorder: Secondary | ICD-10-CM | POA: Diagnosis not present

## 2021-03-29 DIAGNOSIS — E559 Vitamin D deficiency, unspecified: Secondary | ICD-10-CM | POA: Diagnosis not present

## 2021-03-29 LAB — HM MAMMOGRAPHY

## 2021-03-29 LAB — RESULTS CONSOLE HPV: CHL HPV: POSITIVE

## 2021-03-29 LAB — HM PAP SMEAR

## 2021-04-05 DIAGNOSIS — F411 Generalized anxiety disorder: Secondary | ICD-10-CM | POA: Diagnosis not present

## 2021-04-05 DIAGNOSIS — F329 Major depressive disorder, single episode, unspecified: Secondary | ICD-10-CM | POA: Diagnosis not present

## 2021-04-26 DIAGNOSIS — F329 Major depressive disorder, single episode, unspecified: Secondary | ICD-10-CM | POA: Diagnosis not present

## 2021-04-26 DIAGNOSIS — F411 Generalized anxiety disorder: Secondary | ICD-10-CM | POA: Diagnosis not present

## 2021-05-10 DIAGNOSIS — F329 Major depressive disorder, single episode, unspecified: Secondary | ICD-10-CM | POA: Diagnosis not present

## 2021-05-10 DIAGNOSIS — F411 Generalized anxiety disorder: Secondary | ICD-10-CM | POA: Diagnosis not present

## 2021-05-24 DIAGNOSIS — F411 Generalized anxiety disorder: Secondary | ICD-10-CM | POA: Diagnosis not present

## 2021-05-24 DIAGNOSIS — F329 Major depressive disorder, single episode, unspecified: Secondary | ICD-10-CM | POA: Diagnosis not present

## 2021-06-03 DIAGNOSIS — J029 Acute pharyngitis, unspecified: Secondary | ICD-10-CM | POA: Diagnosis not present

## 2021-06-08 DIAGNOSIS — B001 Herpesviral vesicular dermatitis: Secondary | ICD-10-CM | POA: Diagnosis not present

## 2021-06-08 DIAGNOSIS — R059 Cough, unspecified: Secondary | ICD-10-CM | POA: Diagnosis not present

## 2021-07-19 DIAGNOSIS — F329 Major depressive disorder, single episode, unspecified: Secondary | ICD-10-CM | POA: Diagnosis not present

## 2021-07-19 DIAGNOSIS — F411 Generalized anxiety disorder: Secondary | ICD-10-CM | POA: Diagnosis not present

## 2021-08-09 DIAGNOSIS — F411 Generalized anxiety disorder: Secondary | ICD-10-CM | POA: Diagnosis not present

## 2021-08-09 DIAGNOSIS — F329 Major depressive disorder, single episode, unspecified: Secondary | ICD-10-CM | POA: Diagnosis not present

## 2021-09-07 DIAGNOSIS — F411 Generalized anxiety disorder: Secondary | ICD-10-CM | POA: Diagnosis not present

## 2021-09-07 DIAGNOSIS — F329 Major depressive disorder, single episode, unspecified: Secondary | ICD-10-CM | POA: Diagnosis not present

## 2021-09-28 DIAGNOSIS — F329 Major depressive disorder, single episode, unspecified: Secondary | ICD-10-CM | POA: Diagnosis not present

## 2021-09-28 DIAGNOSIS — F411 Generalized anxiety disorder: Secondary | ICD-10-CM | POA: Diagnosis not present

## 2021-10-19 DIAGNOSIS — F329 Major depressive disorder, single episode, unspecified: Secondary | ICD-10-CM | POA: Diagnosis not present

## 2021-10-19 DIAGNOSIS — F411 Generalized anxiety disorder: Secondary | ICD-10-CM | POA: Diagnosis not present

## 2021-11-08 DIAGNOSIS — F329 Major depressive disorder, single episode, unspecified: Secondary | ICD-10-CM | POA: Diagnosis not present

## 2021-11-08 DIAGNOSIS — F411 Generalized anxiety disorder: Secondary | ICD-10-CM | POA: Diagnosis not present

## 2021-12-04 DIAGNOSIS — F329 Major depressive disorder, single episode, unspecified: Secondary | ICD-10-CM | POA: Diagnosis not present

## 2021-12-04 DIAGNOSIS — F411 Generalized anxiety disorder: Secondary | ICD-10-CM | POA: Diagnosis not present

## 2021-12-05 ENCOUNTER — Telehealth: Payer: Self-pay

## 2021-12-05 NOTE — Telephone Encounter (Signed)
Patient Name: [facility] Gender: Female DOB: 1966/12/16 Age: 55 Y 21 M 9 D Return Phone Number: [phone] (Primary) Address: City/ State/ Zip: [place]  [address] [facility] Client Client Site [facility] - Day Provider Raoul Pitch, [place] Contact Type Call Who Is Calling Patient / Member / Family / Caregiver Call Type Triage / Clinical Relationship To Patient Self Return Phone Number [phone] (Primary) Chief Complaint Dizziness Reason for Call Symptomatic / Request for [place] states she has having migraines and her vision is becoming distorted and dizzy and lightheaded. Translation No Nurse Assessment Nurse: Burnadette Peter, RN, Elmo Putt Date/Time (Eastern Time): 12/04/2021 11:43:04 AM Confirm and document reason for call. If symptomatic, describe symptoms. ---Caller states she has having migraines and her vision is becoming distorted and dizzy and lightheaded. Caller states the symptoms started last week but got worse over the weekend. Caller has a hx of migraines. She states that her head just hurts but its not a major headache. Caller states its mostly at the top of her head and feels mostly dizzy and weird. Does the patient have any new or worsening symptoms? ---Yes Will a triage be completed? ---Yes Related visit to physician within the last 2 weeks? ---No Does the PT have any chronic conditions? (i.e. diabetes, asthma, this includes High risk factors for pregnancy, etc.) ---Yes List chronic conditions. ---Migraines Is the patient pregnant or possibly pregnant? (Ask all females between the ages of 41-55) ---No Is this a behavioral health or substance abuse call? ---No Guidelines Guideline Title Affirmed Question Affirmed Notes Nurse Date/Time (Eastern Time) Dizziness - Lightheadedness Loss of vision or double vision (Exception: similar to previous migraines) Burnadette Peter, RN,  Elmo Putt 67/11/4579 99:83:38 AM PLEASE NOTE: All timestamps contained within this report are represented as [place] Standard Time. CONFIDENTIALTY NOTICE: This fax transmission is intended only for the addressee. It contains information that is legally privileged, confidential or otherwise protected from use or disclosure. If you are not the intended recipient, you are strictly prohibited from reviewing, disclosing, copying using or disseminating any of this information or taking any action in reliance on or regarding this information. If you have received this fax in error, please notify us immediately by telephone so that we can arrange for its return to [place]. Phone: [phone], Toll-Free: [phone], Fax: [phone] Page: 2 of 2 Call Id: [id number] [place]. Time Eilene Ghazi Time) Disposition Final User 12/04/2021 11:49:56 AM Go to ED Now (or PCP triage) Yes Burnadette Peter, RN, Laren Everts Disagree/Comply Disagree Caller Understands Yes PreDisposition Did not know what to do Care Advice Given Per Guideline GO TO ED NOW (OR PCP TRIAGE): * IF NO PCP (PRIMARY CARE PROVIDER) SECOND-LEVEL TRIAGE: You need to be seen within the next hour. Go to the [facility] at _____________ [place] as soon as you can. ANOTHER ADULT SHOULD DRIVE: * It is better and safer if another adult drives instead of you. BRING MEDICINES: * Bring a list of your current medicines when you go to the Emergency Department (ER). * Bring the pill bottles too. This will help the doctor (or NP/PA) to make certain you are taking the right medicines and the right dose. CARE ADVICE given per Dizziness (Adult) guideline. Referrals GO TO FACILITY REFUSE

## 2021-12-05 NOTE — Telephone Encounter (Signed)
LVM for pt to [facility] regarding call with triage nurse.   FYI

## 2021-12-13 ENCOUNTER — Other Ambulatory Visit: Payer: Self-pay

## 2021-12-14 ENCOUNTER — Telehealth: Payer: Self-pay

## 2021-12-14 ENCOUNTER — Encounter: Payer: Self-pay | Admitting: Family Medicine

## 2021-12-14 ENCOUNTER — Ambulatory Visit (INDEPENDENT_AMBULATORY_CARE_PROVIDER_SITE_OTHER): Payer: BC Managed Care – PPO | Admitting: Family Medicine

## 2021-12-14 VITALS — BP 101/67 | HR 78 | Temp 97.6°F | Ht 61.0 in | Wt 187.0 lb

## 2021-12-14 DIAGNOSIS — G4452 New daily persistent headache (NDPH): Secondary | ICD-10-CM

## 2021-12-14 DIAGNOSIS — F339 Major depressive disorder, recurrent, unspecified: Secondary | ICD-10-CM

## 2021-12-14 DIAGNOSIS — E538 Deficiency of other specified B group vitamins: Secondary | ICD-10-CM

## 2021-12-14 MED ORDER — SUMATRIPTAN SUCCINATE 50 MG PO TABS: 50.0000 mg | ORAL_TABLET | ORAL | 11 refills | Status: DC | PRN

## 2021-12-14 MED ORDER — "SYRINGE 21G X 1"" 3 ML MISC": 1.0000 mL | 0 refills | Status: AC

## 2021-12-14 MED ORDER — HYDROXYZINE HCL 10 MG PO TABS: 10.0000 mg | ORAL_TABLET | Freq: Every day | ORAL | 5 refills | Status: DC

## 2021-12-14 MED ORDER — CYANOCOBALAMIN 1000 MCG/ML IJ SOLN: 1000.0000 ug | INTRAMUSCULAR | 11 refills | Status: DC

## 2021-12-14 MED ORDER — VENLAFAXINE HCL ER 75 MG PO CP24: 75.0000 mg | ORAL_CAPSULE | Freq: Every day | ORAL | 1 refills | Status: DC

## 2021-12-14 NOTE — Progress Notes (Signed)
This visit occurred during the SARS-CoV-2 public health emergency.  Safety protocols were in place, including screening questions prior to the visit, additional usage of staff PPE, and extensive cleaning of exam room while observing appropriate contact time as indicated for disinfecting solutions.    Cathy [name] , [birth year], 55 y.o., female MRN: [id number] Patient Care Team    Relationship Specialty Notifications Start End  Ma Hillock, DO PCP - General Family Medicine  10/07/17   Lorretta Harp, MD Consulting Physician Cardiology  09/27/17   Paula Compton, MD Consulting Physician Obstetrics and Gynecology  09/27/17     Chief Complaint  Patient presents with   Headache    Pt c/o HA, dizziness, fatigue, lightheaded, heartburn x 2 weeks     Subjective: Pt presents for an OV with complaints of sinus, fatigue, headache and heartburn.  She experienced similar symptoms over the past years when her anxiety had increased.  She reports today she is not taking her B12 or Effexor any longer.  She is using the Imitrex when needed for her migraines.  She is under very stressful time in her life and has recently switched jobs.  She reports she had her eyes checked and did get new glasses, she does not feel this made a difference in her symptoms.  She endorses both vertigo-like symptoms and lightheadedness.  She does not drink much fluid in the day.  She reports she had recent labs completed at her gynecologist and was told they were normal, including her B12. Depression/anxiety: Effexor 75 mg daily had been prescribed in the past and patient had reported it was very helpful with both her depression, anxiety and headaches Chronic headache: Patient has not been taking her B12.  She had reported great improvement in her headaches when taking the B12.  She has had extensive laboratory work-up for these in the past, with normal findings except for lower and B12. Prior note: She reports her  headaches have returned approximately 2 to 3 weeks.  She reports it is the same doll mild headache and over the weekend the sharp shooting pain through the top of her head with numbness and tingling towards her nose reoccurred.  She reported that her headaches were completely improved at the time we were doing B12 injections.  Injections stopped 03/12/2019 and her levels were in normal range.  Reports she has been taking oral B12. CBC, CMP, TSH, magnesium, ANA-all normal. B12 was approximately 150 prior to the start of injections.  Letter than 700 with every 14-day B12 injection. Vitamin D mildly low at 27, now supplementing Prior note: Pt presents for an OV with complaints of headache of 2 weeks duration.  SHe was seen by another provider on December 23 for this condition, that started Dec. 20th and encouraged to report to the emergency room for alarming symptoms with her headache.  She states she did not report to the emergency room as instructed 2/2 financial limitations.  Presents today secondary to her headache has yet to resolve.  Associated symptoms with her headache has been vomiting initially, which has resolved-but nausea remains.  Watery diarrhea occurring daily, worse when she eats that has been present since onset of headache- but absent for last 18 hours.  SHe reports the actual head pain has improved a fraction since being seen on the 23rd it was a "constant pounding pain" but has remained a constant pressure pain around her eyes and occipital area.  Reports pain in bilateral  ears and nausea remains.  She endorses feeling mildly lightheaded or "foggy "   She has had a history of migraines that used to occur frequently many years ago.  She states she has not really had problems with her migraines in the last decade.  Used to take Imitrex when she did have migraines.  Currently she has been taking NSAIDs and Excedrin Migraine to help with her headaches.  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  Normal MRI in 2002-completed secondary to headaches and history of right retinal toxoplasmosis She denies fevers or chills during the last 2 weeks.  SHe denies illness prior to onset of headache.  She denies injury or head trauma.  She denies slurred speech, difficulty swallowing, ringing of her ears, visual changes, unilateral muscle weakness, unsteady gait.  She states that the headache and symptoms started abruptly.  No exposure to sick contacts, under prepared food, antibiotics or travel outside the [place]. Depression screen [facility] 2/9 03/02/2019 08/26/2018 09/27/2017  Decreased Interest 2 0 0  Down, Depressed, Hopeless 3 0 1  PHQ - 2 Score 5 0 1  Altered sleeping 2 - -  Tired, decreased energy 2 - -  Change in appetite 2 - -  Feeling bad or failure about yourself  3 - -  Trouble concentrating 2 - -  Moving slowly or fidgety/restless 1 - -  Suicidal thoughts 0 - -  PHQ-9 Score 17 - -  Difficult doing work/chores Very difficult - -    Allergies  Allergen Reactions   Macrobid [Nitrofurantoin Monohyd Macro] Anaphylaxis   Social History   Social History Narrative   Divorced. 3 children. Bachelors degree. Works as an Optometrist.   Exercises routinely.   Wears her seatbelt. Wears a bicycle helmet. Smoke detector in the home.   Feels safe in her relationships.   Past Medical History:  Diagnosis Date   Depression    Dyspnea on exertion    With atypical chest pain, followed by cardiology.   Genital warts 75/1025   TCA application by GYn   History of frequent urinary tract infections    Juvenile epilepsy ([place])    Migraines    Seizures ([place])    none since 1987- classified as epileptic seizures   Thrombosed external hemorrhoid    Past Surgical History:  Procedure Laterality Date   BILATERAL SALPINGECTOMY Bilateral 12/20/2014   Procedure: BILATERAL SALPINGECTOMY;  Surgeon: Logan Bores, MD;  Location: [facility];  Service:  Gynecology;  Laterality: Bilateral;   HEMORRHOID SURGERY     thrombosed hemorrhoid   LAPAROSCOPIC ASSISTED VAGINAL HYSTERECTOMY N/A 12/20/2014   Procedure: LAPAROSCOPIC ASSISTED VAGINAL HYSTERECTOMY;  Surgeon: Logan Bores, MD;  Location: [facility];  Service: Gynecology;  Laterality: N/A;   tendon repair Right 2011   right ankle   TONSILLECTOMY  1973   Family History  Problem Relation Age of Onset   Hyperlipidemia Mother    Heart disease Mother    Hypertension Mother    Ovarian cancer Mother    Cervical cancer Mother    Heart attack Mother    Depression Sister    Asthma Sister    Asthma Son    Dementia Maternal Grandmother    Heart attack Maternal Grandmother    Heart attack Maternal Grandfather    Heart attack Paternal Grandmother    Allergies as of 12/14/2021       Reactions   Macrobid [nitrofurantoin Monohyd  Macro] Anaphylaxis        Medication List        Accurate as of December 14, 2021 10:40 AM. If you have any questions, ask your nurse or doctor.          cyanocobalamin 1000 MCG/ML injection Commonly known as: (VITAMIN B-12) Inject 1 mL (1,000 mcg total) into the muscle every 14 (fourteen) days.   hydrocortisone 25 MG suppository Commonly known as: ANUSOL-HC Place 1 suppository (25 mg total) rectally 2 (two) times daily.   hydrocortisone cream 1 % Apply 1 application topically 2 (two) times daily.   SUMAtriptan 50 MG tablet Commonly known as: Imitrex Take 1 tablet (50 mg total) by mouth every 2 (two) hours as needed for migraine. May repeat in 2 hours if headache persists or recurs.   SYRINGE 3CC/21GX1" 21G X 1" 3 ML Misc Inject 1 mL into the muscle every 14 (fourteen) days. Pt to inject 66m every 14 days intramuscular DX  E53.8   venlafaxine XR 75 MG 24 hr capsule Commonly known as: Effexor XR Take 1 capsule (75 mg total) by mouth daily with breakfast.        All past medical history, surgical history, allergies, family history,  immunizations andmedications were updated in the EMR today and reviewed under the history and medication portions of their EMR.     ROS Negative, with the exception of above mentioned in HPI   Objective:  BP 101/67    Pulse 78    Temp 97.6 F (36.4 C) (Oral)    Ht _0  (1.549 m)    Wt 187 lb (84.8 kg)    LMP 11/19/2014    SpO2 97%    BMI 35.33 kg/m  Body mass index is 35.33 kg/m.  Physical Exam Vitals and nursing note reviewed.  Constitutional:      General: She is not in acute distress.    Appearance: Normal appearance. She is obese. She is not ill-appearing, toxic-appearing or diaphoretic.  HENT:     Head: Normocephalic and atraumatic.     Mouth/Throat:     Mouth: Mucous membranes are moist.  Eyes:     General: No visual field deficit or scleral icterus.       Right eye: No discharge.        Left eye: No discharge.     Extraocular Movements: Extraocular movements intact.     Conjunctiva/sclera: Conjunctivae normal.     Pupils: Pupils are equal, round, and reactive to light. Pupils are equal.  Cardiovascular:     Rate and Rhythm: Normal rate and regular rhythm.     Heart sounds: No murmur heard. Pulmonary:     Effort: Pulmonary effort is normal. No respiratory distress.     Breath sounds: Normal breath sounds. No wheezing, rhonchi or rales.  Musculoskeletal:     Cervical back: Neck supple. No rigidity or tenderness.  Lymphadenopathy:     Cervical: No cervical adenopathy.  Skin:    General: Skin is warm and dry.     Coloration: Skin is not jaundiced or pale.     Findings: No erythema or rash.  Neurological:     Mental Status: She is alert and oriented to person, place, and time. Mental status is at baseline.     Cranial Nerves: No dysarthria or facial asymmetry.     Motor: No weakness.     Coordination: Coordination normal.     Gait: Gait normal.  Psychiatric:  Mood and Affect: Mood normal.        Speech: Speech normal.        Behavior: Behavior normal.         Thought Content: Thought content normal.        Judgment: Judgment normal.     Comments: Stressed and sad     No results found. No results found. No results found for this or any previous visit (from the past 24 hour(s)).  Assessment/Plan: PEG FIFER is a 55 y.o. female present for OV for  Nonintractable headache, unspecified chronicity pattern, unspecified headache type/fatigue/B12 -Patient reports headaches have returned .  She is not taking her B12 injections.  She is under a great deal stress.  This is consistent when her headaches increased last time. -Restart B12 injections every 2 weeks.  Patient self injects. -We will  hold any lab work for now, considering it has always been normal in the past with her headaches.  She will follow-up in 7-8 weeks and we will perform her CPE and fasting labs during that appointment. - CBC, Comp Met (CMET, Magnesium,TSH, ANA--> Normal and prior headache work-ups. -Continue Imitrex as needed -Discussed with her today that if her headaches do not resolve with the restart of B12 injections within 4 weeks, or they are worsening, I would recommend she follow-up immediately and we would need to obtain an MRI, discuss preventative meds and consider neurology referral.  Patient reports understanding   Depression, recurrent ([place]) - suspect much of her symptoms are related to her stress level -Restart Effexor 75 mg daily-this is also helped with her headaches in the past - 24 hour crisis line information provided to her.   -Follow-up 7-8 weeks for CPE and restart of the above medications.  Reviewed expectations re: course of current medical issues. Discussed self-management of symptoms. Outlined signs and symptoms indicating need for more acute intervention. Patient verbalized understanding and all questions were answered. Patient received an After-Visit Summary.    No orders of the defined types were placed in this encounter.  No orders of  the defined types were placed in this encounter.  Referral Orders  No referral(s) requested today     Note is dictated utilizing voice recognition software. Although note has been proof read prior to signing, occasional typographical errors still can be missed. If any questions arise, please do not hesitate to call for verification.   electronically signed by:  Howard Pouch, DO  [place]

## 2021-12-14 NOTE — Telephone Encounter (Signed)
Pt had appt today with new insurance. She had a previous balance that showed and pt states that it is a fee from insufficient funds that was charged incorrectly because the payment had gone through. If you could assist with this.  Thanks

## 2021-12-14 NOTE — Patient Instructions (Signed)
°  Great to see you today.  I have refilled the medication(s) we provide.   If labs were collected, we will inform you of lab results once received either by echart message or telephone call.   - echart message- for normal results that have been seen by the patient already.   - telephone call: abnormal results or if patient has not viewed results in their echart.  Follow up in 7-8 weeks, sooner if headaches worsen.

## 2021-12-20 NOTE — Telephone Encounter (Signed)
Tuesday, December 19, 2021 3:15 PM  Pt has contacted [facility] and states that she has disputed a past insufficient fund payment on her account. Pt states that she's called billing several times and asked to speak to management. She was told billing team lead would contact her. She says no one has called. She is telling me she has paid on her accounts with using her past HSA account and no one has been able to tell her what the money was applied to. Cathy [name] said [facility] doesn't send bills which confused me. The payments she is referring to are 2 years+ in the past. She's asked for itemized payment history as well.   Notified pt that I would send email sent to billing team leadership to follow up with her. Email sent. Phone # [phone]

## 2022-01-01 DIAGNOSIS — F411 Generalized anxiety disorder: Secondary | ICD-10-CM | POA: Diagnosis not present

## 2022-01-01 DIAGNOSIS — F329 Major depressive disorder, single episode, unspecified: Secondary | ICD-10-CM | POA: Diagnosis not present

## 2022-01-02 NOTE — Telephone Encounter (Signed)
Billing contacted pt 12/28 Larene Beach) and 12/29 Gilmore Laroche).

## 2022-01-22 DIAGNOSIS — F329 Major depressive disorder, single episode, unspecified: Secondary | ICD-10-CM | POA: Diagnosis not present

## 2022-01-22 DIAGNOSIS — F411 Generalized anxiety disorder: Secondary | ICD-10-CM | POA: Diagnosis not present

## 2022-02-08 ENCOUNTER — Other Ambulatory Visit: Payer: Self-pay

## 2022-02-08 ENCOUNTER — Encounter: Payer: Self-pay | Admitting: Family Medicine

## 2022-02-08 ENCOUNTER — Ambulatory Visit (INDEPENDENT_AMBULATORY_CARE_PROVIDER_SITE_OTHER): Payer: BC Managed Care – PPO | Admitting: Family Medicine

## 2022-02-08 VITALS — BP 121/68 | HR 61 | Temp 98.2°F | Ht 61.25 in | Wt 186.0 lb

## 2022-02-08 DIAGNOSIS — Z0000 Encounter for general adult medical examination without abnormal findings: Secondary | ICD-10-CM

## 2022-02-08 DIAGNOSIS — F339 Major depressive disorder, recurrent, unspecified: Secondary | ICD-10-CM | POA: Diagnosis not present

## 2022-02-08 DIAGNOSIS — E538 Deficiency of other specified B group vitamins: Secondary | ICD-10-CM | POA: Diagnosis not present

## 2022-02-08 DIAGNOSIS — Z130 Encounter for screening for diseases of the blood and blood-forming organs and certain disorders involving the immune mechanism: Secondary | ICD-10-CM | POA: Diagnosis not present

## 2022-02-08 DIAGNOSIS — Z131 Encounter for screening for diabetes mellitus: Secondary | ICD-10-CM | POA: Diagnosis not present

## 2022-02-08 DIAGNOSIS — Z1211 Encounter for screening for malignant neoplasm of colon: Secondary | ICD-10-CM

## 2022-02-08 DIAGNOSIS — G44229 Chronic tension-type headache, not intractable: Secondary | ICD-10-CM

## 2022-02-08 DIAGNOSIS — Z8041 Family history of malignant neoplasm of ovary: Secondary | ICD-10-CM

## 2022-02-08 DIAGNOSIS — Z79899 Other long term (current) drug therapy: Secondary | ICD-10-CM

## 2022-02-08 DIAGNOSIS — Z1159 Encounter for screening for other viral diseases: Secondary | ICD-10-CM

## 2022-02-08 DIAGNOSIS — E669 Obesity, unspecified: Secondary | ICD-10-CM | POA: Diagnosis not present

## 2022-02-08 LAB — HEMOGLOBIN A1C: Hgb A1c MFr Bld: 5.7 % (ref 4.6–6.5)

## 2022-02-08 LAB — COMPREHENSIVE METABOLIC PANEL
ALT: 19 U/L (ref 0–35)
AST: 15 U/L (ref 0–37)
Albumin: 4.4 g/dL (ref 3.5–5.2)
Alkaline Phosphatase: 80 U/L (ref 39–117)
BUN: 10 mg/dL (ref 6–23)
CO2: 34 mEq/L — ABNORMAL HIGH (ref 19–32)
Calcium: 9.6 mg/dL (ref 8.4–10.5)
Chloride: 102 mEq/L (ref 96–112)
Creatinine, Ser: 0.85 mg/dL (ref 0.40–1.20)
GFR: 77.56 mL/min (ref >60.00)
Glucose, Bld: 96 mg/dL (ref 70–99)
Potassium: 4.1 mEq/L (ref 3.5–5.1)
Sodium: 139 mEq/L (ref 135–145)
Total Bilirubin: 0.5 mg/dL (ref 0.2–1.2)
Total Protein: 6.7 g/dL (ref 6.0–8.3)

## 2022-02-08 LAB — CBC
HCT: 41.9 % (ref 36.0–46.0)
Hemoglobin: 13.9 g/dL (ref 12.0–15.0)
MCHC: 33.1 g/dL (ref 30.0–36.0)
MCV: 94.6 fl (ref 78.0–100.0)
Platelets: 230 10*3/uL (ref 150.0–400.0)
RBC: 4.43 Mil/uL (ref 3.87–5.11)
RDW: 13.5 % (ref 11.5–15.5)
WBC: 6 10*3/uL (ref 4.0–10.5)

## 2022-02-08 LAB — LIPID PANEL
Cholesterol: 255 mg/dL — ABNORMAL HIGH (ref 0–200)
HDL: 60.1 mg/dL (ref >39.00)
LDL Cholesterol: 170 mg/dL — ABNORMAL HIGH (ref 0–99)
NonHDL: 195.09
Total CHOL/HDL Ratio: 4
Triglycerides: 124 mg/dL (ref 0.0–149.0)
VLDL: 24.8 mg/dL (ref 0.0–40.0)

## 2022-02-08 LAB — TSH: TSH: 2.36 u[IU]/mL (ref 0.35–5.50)

## 2022-02-08 LAB — VITAMIN B12: Vitamin B-12: 265 pg/mL (ref 211–911)

## 2022-02-08 NOTE — Patient Instructions (Signed)
°Great to see you today.  °I have refilled the medication(s) we provide.  ° °If labs were collected, we will inform you of lab results once received either by echart message or telephone call.  ° - echart message- for normal results that have been seen by the patient already.  ° - telephone call: abnormal results or if patient has not viewed results in their echart. ° °Health Maintenance, Female °Adopting a healthy lifestyle and getting preventive care are important in promoting health and wellness. Ask your health care provider about: °The right schedule for you to have regular tests and exams. °Things you can do on your own to prevent diseases and keep yourself healthy. °What should I know about diet, weight, and exercise? °Eat a healthy diet ° °Eat a diet that includes plenty of vegetables, fruits, low-fat dairy products, and lean protein. °Do not eat a lot of foods that are high in solid fats, added sugars, or sodium. °Maintain a healthy weight °Body mass index (BMI) is used to identify weight problems. It estimates body fat based on height and weight. Your health care provider can help determine your BMI and help you achieve or maintain a healthy weight. °Get regular exercise °Get regular exercise. This is one of the most important things you can do for your health. Most adults should: °Exercise for at least 150 minutes each week. The exercise should increase your heart rate and make you sweat (moderate-intensity exercise). °Do strengthening exercises at least twice a week. This is in addition to the moderate-intensity exercise. °Spend less time sitting. Even light physical activity can be beneficial. °Watch cholesterol and blood lipids °Have your blood tested for lipids and cholesterol at 55 years of age, then have this test every 5 years. °Have your cholesterol levels checked more often if: °Your lipid or cholesterol levels are high. °You are older than 55 years of age. °You are at high risk for heart  disease. °What should I know about cancer screening? °Depending on your health history and family history, you may need to have cancer screening at various ages. This may include screening for: °Breast cancer. °Cervical cancer. °Colorectal cancer. °Skin cancer. °Lung cancer. °What should I know about heart disease, diabetes, and high blood pressure? °Blood pressure and heart disease °High blood pressure causes heart disease and increases the risk of stroke. This is more likely to develop in people who have high blood pressure readings or are overweight. °Have your blood pressure checked: °Every 3-5 years if you are 18-39 years of age. °Every year if you are 40 years old or older. °Diabetes °Have regular diabetes screenings. This checks your fasting blood sugar level. Have the screening done: °Once every three years after age 40 if you are at a normal weight and have a low risk for diabetes. °More often and at a younger age if you are overweight or have a high risk for diabetes. °What should I know about preventing infection? °Hepatitis B °If you have a higher risk for hepatitis B, you should be screened for this virus. Talk with your health care provider to find out if you are at risk for hepatitis B infection. °Hepatitis C °Testing is recommended for: °Everyone born from 1945 through 1965. °Anyone with known risk factors for hepatitis C. °Sexually transmitted infections (STIs) °Get screened for STIs, including gonorrhea and chlamydia, if: °You are sexually active and are younger than 55 years of age. °You are older than 55 years of age and your health care provider   tells you that you are at risk for this type of infection. °Your sexual activity has changed since you were last screened, and you are at increased risk for chlamydia or gonorrhea. Ask your health care provider if you are at risk. °Ask your health care provider about whether you are at high risk for HIV. Your health care provider may recommend a  prescription medicine to help prevent HIV infection. If you choose to take medicine to prevent HIV, you should first get tested for HIV. You should then be tested every 3 months for as long as you are taking the medicine. °Pregnancy °If you are about to stop having your period (premenopausal) and you may become pregnant, seek counseling before you get pregnant. °Take 400 to 800 micrograms (mcg) of folic acid every day if you become pregnant. °Ask for birth control (contraception) if you want to prevent pregnancy. °Osteoporosis and menopause °Osteoporosis is a disease in which the bones lose minerals and strength with aging. This can result in bone fractures. If you are 65 years old or older, or if you are at risk for osteoporosis and fractures, ask your health care provider if you should: °Be screened for bone loss. °Take a calcium or vitamin D supplement to lower your risk of fractures. °Be given hormone replacement therapy (HRT) to treat symptoms of menopause. °Follow these instructions at home: °Alcohol use °Do not drink alcohol if: °Your health care provider tells you not to drink. °You are pregnant, may be pregnant, or are planning to become pregnant. °If you drink alcohol: °Limit how much you have to: °0-1 drink a day. °Know how much alcohol is in your drink. In the [place] one drink equals one 12 oz bottle of beer (355 mL), one 5 oz glass of wine (148 mL), or one 1½ oz glass of hard liquor (44 mL). °Lifestyle °Do not use any products that contain nicotine or tobacco. These products include cigarettes, chewing tobacco, and vaping devices, such as e-cigarettes. If you need help quitting, ask your health care provider. °Do not use street drugs. °Do not share needles. °Ask your health care provider for help if you need support or information about quitting drugs. °General instructions °Schedule regular health, dental, and eye exams. °Stay current with your vaccines. °Tell your health care provider if: °You often  feel depressed. °You have ever been abused or do not feel safe at home. °Summary °Adopting a healthy lifestyle and getting preventive care are important in promoting health and wellness. °Follow your health care provider's instructions about healthy diet, exercising, and getting tested or screened for diseases. °Follow your health care provider's instructions on monitoring your cholesterol and blood pressure. °This information is not intended to replace advice given to you by your health care provider. Make sure you discuss any questions you have with your health care provider. °Document Revised: 05/01/2021 Document Reviewed: 05/01/2021 °Elsevier Patient Education © 2022 [employer] ° °

## 2022-02-08 NOTE — Progress Notes (Signed)
This visit occurred during the SARS-CoV-2 public health emergency.  Safety protocols were in place, including screening questions prior to the visit, additional usage of staff PPE, and extensive cleaning of exam room while observing appropriate contact time as indicated for disinfecting solutions.    Patient ID: Cathy [name], female  DOB: 1966-03-09, 56 y.o.   MRN: [id number] Patient Care Team    Relationship Specialty Notifications Start End  Ma Hillock, DO PCP - General Family Medicine  10/07/17   Lorretta Harp, MD Consulting Physician Cardiology  09/27/17   Paula Compton, MD Consulting Physician Obstetrics and Gynecology  09/27/17     Chief Complaint  Patient presents with   Annual Exam    Pt is fasting    Subjective: Cathy [name] is a 56 y.o.  Female  present for CPE All past medical history, surgical history, allergies, family history, immunizations, medications and social history were updated in the electronic medical record today. All recent labs, ED visits and hospitalizations within the last year were reviewed.  Health maintenance:  Colonoscopy: no Fhx. Cologuard ordered today Mammogram: completed:03/2021- GYN Cervical cancer screening: last pelvic: 06/2018, results, completed by: GYN. Dr Marvel Plan Immunizations: tdap UTD 2017, Influenza declined(encouraged yearly), shingrix declined Infectious disease screening: HIV completed. Hep C cpmpleted today DEXA: screen ~60 Assistive device: none Oxygen WUJ:WJXB Patient has a Dental home. Hospitalizations/ED visits: reviewed  Depression/anxiety: Patient reports she is doing well since starting back on the Effexor. Prior note: Pt presents for an OV with complaints of sinus, fatigue, headache and heartburn.  She experienced similar symptoms over the past years when her anxiety had increased.  She reports today she is not taking her B12 or Effexor any longer.  She is using the Imitrex when needed for her migraines.  She  is under very stressful time in her life and has recently switched jobs.  She reports she had her eyes checked and did get new glasses, she does not feel this made a difference in her symptoms.  She endorses both vertigo-like symptoms and lightheadedness.  She does not drink much fluid in the day.  She reports she had recent labs completed at her gynecologist and was told they were normal, including her B12. Prior note: Effexor 75 mg daily had been prescribed in the past and patient had reported it was very helpful with both her depression, anxiety and headaches  Chronic headache: Patient reports compliance with B12 injections every 2 weeks.  She is using the Vistaril as needed.  She has started back the Effexor for her depression and anxiety, which also helped with her headaches in the past.  She states her headaches have greatly improved.  They are still present on occasions.  She is working on herself care and has been scheduling herself routine massages which also help. Prior note: Patient has not been taking her B12.  She had reported great improvement in her headaches when taking the B12.  She has had extensive laboratory work-up for these in the past, with normal findings except for lower and B12. Prior note: She reports her headaches have returned approximately 2 to 3 weeks.  She reports it is the same doll mild headache and over the weekend the sharp shooting pain through the top of her head with numbness and tingling towards her nose reoccurred.  She reported that her headaches were completely improved at the time we were doing B12 injections.  Injections stopped 03/12/2019 and her levels were in normal range.  Reports she has been taking oral B12. CBC, CMP, TSH, magnesium, ANA-all normal. B12 was approximately 150 prior to the start of injections.  Letter than 700 with every 14-day B12 injection. Vitamin D mildly low at 27, now supplementing Prior note: Pt presents for an OV with complaints  of headache of 2 weeks duration.  SHe was seen by another provider on December 23 for this condition, that started Dec. 20th and encouraged to report to the emergency room for alarming symptoms with her headache.  She states she did not report to the emergency room as instructed 2/2 financial limitations.  Presents today secondary to her headache has yet to resolve.  Associated symptoms with her headache has been vomiting initially, which has resolved-but nausea remains.  Watery diarrhea occurring daily, worse when she eats that has been present since onset of headache- but absent for last 18 hours.  SHe reports the actual head pain has improved a fraction since being seen on the 23rd it was a "constant pounding pain" but has remained a constant pressure pain around her eyes and occipital area.  Reports pain in bilateral ears and nausea remains.  She endorses feeling mildly lightheaded or "foggy "   She has had a history of migraines that used to occur frequently many years ago.  She states she has not really had problems with her migraines in the last decade.  Used to take Imitrex when she did have migraines.  Currently she has been taking NSAIDs and Excedrin Migraine to help with her headaches.  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  Normal MRI in 2002-completed secondary to headaches and history of right retinal toxoplasmosis She denies fevers or chills during the last 2 weeks.  SHe denies illness prior to onset of headache.  She denies injury or head trauma.  She denies slurred speech, difficulty swallowing, ringing of her ears, visual changes, unilateral muscle weakness, unsteady gait.  She states that the headache and symptoms started abruptly.  No exposure to sick contacts, under prepared food, antibiotics or travel outside the [place].  Depression screen [facility] 2/9 02/08/2022 12/14/2021 03/02/2019 08/26/2018 09/27/2017  Decreased Interest _0 0 0   Down, Depressed, Hopeless _1 0 1  PHQ - 2 Score _2 0 1  Altered sleeping _3 - -  Tired, decreased energy _4 - -  Change in appetite _5 - -  Feeling bad or failure about yourself  _6 - -  Trouble concentrating _7 - -  Moving slowly or fidgety/restless 0 1 1 - -  Suicidal thoughts 0 0 0 - -  PHQ-9 Score _8 - -  Difficult doing work/chores - - Very difficult - -   GAD 7 : Generalized Anxiety Score 02/08/2022 12/14/2021 03/02/2019  Nervous, Anxious, on Edge _9 Control/stop worrying _10 Worry too much - different things _11 Trouble relaxing _12 Restless _13 Easily annoyed or irritable _14 Afraid - awful might happen _15 Total GAD 7 Score _16 Anxiety Difficulty - - Very difficult    Immunization History  Administered Date(s) Administered   Influenza,inj,Quad PF,6+ Mos 08/26/2018   Influenza-Unspecified 11/15/2014   Tdap 10/12/2010, 04/12/2016    Past Medical History:  Diagnosis Date  Depression    Dyspnea on exertion    With atypical chest pain, followed by cardiology.   Genital warts 48/1856   TCA application by GYn   History of frequent urinary tract infections    Juvenile epilepsy ([place])    Migraines    Seizures ([place])    none since 1987- classified as epileptic seizures   Thrombosed external hemorrhoid    Allergies  Allergen Reactions   Macrobid [Nitrofurantoin Monohyd Macro] Anaphylaxis   Past Surgical History:  Procedure Laterality Date   BILATERAL SALPINGECTOMY Bilateral 12/20/2014   Procedure: BILATERAL SALPINGECTOMY;  Surgeon: Logan Bores, MD;  Location: [facility];  Service: Gynecology;  Laterality: Bilateral;   HEMORRHOID SURGERY     thrombosed hemorrhoid   LAPAROSCOPIC ASSISTED VAGINAL HYSTERECTOMY N/A 12/20/2014   Procedure: LAPAROSCOPIC ASSISTED VAGINAL HYSTERECTOMY;  Surgeon: Logan Bores, MD;  Location: [facility];  Service: Gynecology;  Laterality: N/A;   tendon repair Right 2011   right  ankle   TONSILLECTOMY  1973   Family History  Problem Relation Age of Onset   Hyperlipidemia Mother    Heart disease Mother    Hypertension Mother    Ovarian cancer Mother    Cervical cancer Mother    Heart attack Mother    Depression Sister    Asthma Sister    Asthma Son    Dementia Maternal Grandmother    Heart attack Maternal Grandmother    Heart attack Maternal Grandfather    Heart attack Paternal Grandmother    Social History   Social History Narrative   Divorced. 3 children. Bachelors degree. Works as an Optometrist.   Exercises routinely.   Wears her seatbelt. Wears a bicycle helmet. Smoke detector in the home.   Feels safe in her relationships.    Allergies as of 02/08/2022       Reactions   Macrobid [nitrofurantoin Monohyd Macro] Anaphylaxis        Medication List        Accurate as of February 08, 2022 11:33 AM. If you have any questions, ask your nurse or doctor.          cyanocobalamin 1000 MCG/ML injection Commonly known as: (VITAMIN B-12) Inject 1 mL (1,000 mcg total) into the muscle every 14 (fourteen) days.   hydrOXYzine 10 MG tablet Commonly known as: ATARAX Take 1-3 tablets (10-30 mg total) by mouth at bedtime.   SUMAtriptan 50 MG tablet Commonly known as: Imitrex Take 1 tablet (50 mg total) by mouth every 2 (two) hours as needed for migraine. May repeat in 2 hours if headache persists or recurs.   SYRINGE 3CC/21GX1" 21G X 1" 3 ML Misc Inject 1 mL into the muscle every 14 (fourteen) days. Pt to inject 52m every 14 days intramuscular DX  E53.8   venlafaxine XR 75 MG 24 hr capsule Commonly known as: EFFEXOR-XR Take 1 capsule (75 mg total) by mouth daily with breakfast.        All past medical history, surgical history, allergies, family history, immunizations andmedications were updated in the EMR today and reviewed under the history and medication portions of their EMR.     No results found for this or any previous visit (from the  past 2160 hour(s)).   ROS 14 pt review of systems performed and negative (unless mentioned in an HPI)  Objective:  BP 121/68    Pulse 61    Temp 98.2 F (36.8 C) (Oral)    Ht 5' 1.25" (1.556 m)  Wt 186 lb (84.4 kg)    LMP 11/19/2014    SpO2 100%    BMI 34.86 kg/m  Physical Exam Vitals and nursing note reviewed.  Constitutional:      General: She is not in acute distress.    Appearance: Normal appearance. She is obese. She is not ill-appearing or toxic-appearing.  HENT:     Head: Normocephalic and atraumatic.     Right Ear: Tympanic membrane, ear canal and external ear normal. There is no impacted cerumen.     Left Ear: Tympanic membrane, ear canal and external ear normal. There is no impacted cerumen.     Nose: No congestion or rhinorrhea.     Mouth/Throat:     Mouth: Mucous membranes are moist.     Pharynx: Oropharynx is clear. No oropharyngeal exudate or posterior oropharyngeal erythema.  Eyes:     General: No scleral icterus.       Right eye: No discharge.        Left eye: No discharge.     Extraocular Movements: Extraocular movements intact.     Conjunctiva/sclera: Conjunctivae normal.     Pupils: Pupils are equal, round, and reactive to light.  Cardiovascular:     Rate and Rhythm: Normal rate and regular rhythm.     Pulses: Normal pulses.     Heart sounds: Normal heart sounds. No murmur heard.   No friction rub. No gallop.  Pulmonary:     Effort: Pulmonary effort is normal. No respiratory distress.     Breath sounds: Normal breath sounds. No stridor. No wheezing, rhonchi or rales.  Chest:     Chest wall: No tenderness.  Abdominal:     General: Abdomen is flat. Bowel sounds are normal. There is no distension.     Palpations: Abdomen is soft. There is no mass.     Tenderness: There is no abdominal tenderness. There is no right CVA tenderness, left CVA tenderness, guarding or rebound.     Hernia: No hernia is present.  Musculoskeletal:        General: No swelling,  tenderness or deformity. Normal range of motion.     Cervical back: Normal range of motion and neck supple. No rigidity or tenderness.     Right lower leg: No edema.     Left lower leg: No edema.  Lymphadenopathy:     Cervical: No cervical adenopathy.  Skin:    General: Skin is warm and dry.     Coloration: Skin is not jaundiced or pale.     Findings: No bruising, erythema, lesion or rash.  Neurological:     General: No focal deficit present.     Mental Status: She is alert and oriented to person, place, and time. Mental status is at baseline.     Cranial Nerves: No cranial nerve deficit.     Sensory: No sensory deficit.     Motor: No weakness.     Coordination: Coordination normal.     Gait: Gait normal.     Deep Tendon Reflexes: Reflexes normal.  Psychiatric:        Mood and Affect: Mood normal.        Behavior: Behavior normal.        Thought Content: Thought content normal.        Judgment: Judgment normal.     No results found.  Assessment/plan: Cathy [name] is a 56 y.o. female present for CPE  Nonintractable headache, unspecified chronicity pattern, unspecified headache type/fatigue/B12 -improved -continue  B12 injections every 2 weeks.  Patient self injects. - CBC, Comp Met (CMET, Magnesium,TSH, ANA--> Normal and prior headache work-ups. -Continue  Imitrex as needed  Depression, recurrent ([place]) - much improved since starting back on effexor - tsh collected today -continue  Effexor 75 mg daily- -Follow-up 5.5 mos  Family history of ovarian cancer Follows with gyn Obesity (BMI 30-39.9) - Lipid panel B12 deficiency - Vitamin B12 Diabetes mellitus screening a1c Colon cancer screening - Cologuard Encounter for hepatitis C screening test for low risk patient - Hepatitis C antibody Screening for deficiency anemia - CBC Encounter for long-term current use of medication CMP Routine general medical examination at a health care facility Colonoscopy: no Fhx.  Cologuard ordered today Mammogram: completed:03/2021- GYN Cervical cancer screening: last pelvic: 06/2018, results, completed by: GYN. Dr Marvel Plan Immunizations: tdap UTD 2017, Influenza declined(encouraged yearly), shingrix declined Infectious disease screening: HIV completed. Hep C cpmpleted today DEXA: screen ~60 Patient was encouraged to exercise greater than 150 minutes a week. Patient was encouraged to choose a diet filled with fresh fruits and vegetables, and lean meats. AVS provided to patient today for education/recommendation on gender specific health and safety maintenance.  Return in about 5 months (around 07/16/2022) for [place] (30 min).    Orders Placed This Encounter  Procedures   CBC   Comprehensive metabolic panel   Lipid panel   TSH   Vitamin B12   Cologuard   Hepatitis C antibody   Hemoglobin A1c   No orders of the defined types were placed in this encounter.  Referral Orders  No referral(s) requested today     Electronically signed by: Howard Pouch, [place]

## 2022-02-09 ENCOUNTER — Telehealth: Payer: Self-pay | Admitting: Family Medicine

## 2022-02-09 LAB — HEPATITIS C ANTIBODY
Hepatitis C Ab: NONREACTIVE
SIGNAL TO CUT-OFF: <0.02 (ref <1.00)

## 2022-02-09 NOTE — Telephone Encounter (Signed)
Spoke with pt regarding labs and instructions. Pt stated that she will start the OTC red yeast rice.

## 2022-02-09 NOTE — Telephone Encounter (Signed)
Please call patient Cathy [name], kidney and thyroid function are normal Blood cell counts and electrolytes are normal Diabetes screening/A1c is normal  Cholesterol panel panel is HIGH. LDL/Bad cholesterol  is above goal at 170. The rest of the panel looks good. > I would encourage her to start red yeast rice supplement OTC or we can start a statin medication to help her lower cholesterol.     - routine exercise and low saturated fat diet, high in fiber will also help. F/u 6 mos for recheck fasting with provider B12 is VERY low. Please make sure she is taking the injections every 2 weeks. If she elects not to inject she can take 5000 mcg sublingual solution daily OTC.

## 2022-03-04 ENCOUNTER — Encounter (HOSPITAL_BASED_OUTPATIENT_CLINIC_OR_DEPARTMENT_OTHER): Payer: Self-pay | Admitting: *Deleted

## 2022-03-04 ENCOUNTER — Emergency Department (HOSPITAL_BASED_OUTPATIENT_CLINIC_OR_DEPARTMENT_OTHER)
Admission: EM | Admit: 2022-03-04 | Discharge: 2022-03-04 | Disposition: A | Discharge status: Home / Self Care | Payer: BC Managed Care – PPO | Attending: Emergency Medicine | Admitting: Emergency Medicine

## 2022-03-04 ENCOUNTER — Other Ambulatory Visit: Payer: Self-pay

## 2022-03-04 ENCOUNTER — Emergency Department (HOSPITAL_BASED_OUTPATIENT_CLINIC_OR_DEPARTMENT_OTHER): Payer: BC Managed Care – PPO

## 2022-03-04 DIAGNOSIS — K529 Noninfective gastroenteritis and colitis, unspecified: Secondary | ICD-10-CM | POA: Diagnosis not present

## 2022-03-04 DIAGNOSIS — R9431 Abnormal electrocardiogram [ECG] [EKG]: Secondary | ICD-10-CM | POA: Diagnosis not present

## 2022-03-04 DIAGNOSIS — K6389 Other specified diseases of intestine: Secondary | ICD-10-CM | POA: Diagnosis not present

## 2022-03-04 DIAGNOSIS — R110 Nausea: Secondary | ICD-10-CM | POA: Diagnosis not present

## 2022-03-04 DIAGNOSIS — R1011 Right upper quadrant pain: Secondary | ICD-10-CM | POA: Diagnosis not present

## 2022-03-04 DIAGNOSIS — D72829 Elevated white blood cell count, unspecified: Secondary | ICD-10-CM | POA: Diagnosis not present

## 2022-03-04 DIAGNOSIS — R109 Unspecified abdominal pain: Secondary | ICD-10-CM | POA: Diagnosis not present

## 2022-03-04 DIAGNOSIS — R1013 Epigastric pain: Secondary | ICD-10-CM | POA: Diagnosis not present

## 2022-03-04 LAB — COMPREHENSIVE METABOLIC PANEL
ALT: 28 U/L (ref 0–44)
AST: 23 U/L (ref 15–41)
Albumin: 4.4 g/dL (ref 3.5–5.0)
Alkaline Phosphatase: 78 U/L (ref 38–126)
Anion gap: 11 (ref 5–15)
BUN: 11 mg/dL (ref 6–20)
CO2: 23 mmol/L (ref 22–32)
Calcium: 9.4 mg/dL (ref 8.9–10.3)
Chloride: 105 mmol/L (ref 98–111)
Creatinine, Ser: 0.94 mg/dL (ref 0.44–1.00)
GFR, Estimated: >60 mL/min (ref >60)
Glucose, Bld: 110 mg/dL — ABNORMAL HIGH (ref 70–99)
Potassium: 3.7 mmol/L (ref 3.5–5.1)
Sodium: 139 mmol/L (ref 135–145)
Total Bilirubin: 0.4 mg/dL (ref 0.3–1.2)
Total Protein: 7.7 g/dL (ref 6.5–8.1)

## 2022-03-04 LAB — LACTIC ACID, PLASMA: Lactic Acid, Venous: 1.8 mmol/L (ref 0.5–1.9)

## 2022-03-04 LAB — CBC WITH DIFFERENTIAL/PLATELET
Abs Immature Granulocytes: 0.06 10*3/uL (ref 0.00–0.07)
Basophils Absolute: 0 10*3/uL (ref 0.0–0.1)
Basophils Relative: 0 %
Eosinophils Absolute: 0.2 10*3/uL (ref 0.0–0.5)
Eosinophils Relative: 1 %
HCT: 43.2 % (ref 36.0–46.0)
Hemoglobin: 14.7 g/dL (ref 12.0–15.0)
Immature Granulocytes: 1 %
Lymphocytes Relative: 12 %
Lymphs Abs: 1.6 10*3/uL (ref 0.7–4.0)
MCH: 31.2 pg (ref 26.0–34.0)
MCHC: 34 g/dL (ref 30.0–36.0)
MCV: 91.7 fL (ref 80.0–100.0)
Monocytes Absolute: 0.5 10*3/uL (ref 0.1–1.0)
Monocytes Relative: 4 %
Neutro Abs: 10.7 10*3/uL — ABNORMAL HIGH (ref 1.7–7.7)
Neutrophils Relative %: 82 %
Platelets: 266 10*3/uL (ref 150–400)
RBC: 4.71 MIL/uL (ref 3.87–5.11)
RDW: 12.9 % (ref 11.5–15.5)
WBC: 13.1 10*3/uL — ABNORMAL HIGH (ref 4.0–10.5)
nRBC: 0 % (ref 0.0–0.2)

## 2022-03-04 LAB — LIPASE, BLOOD: Lipase: 33 U/L (ref 11–51)

## 2022-03-04 MED ORDER — HYDROMORPHONE HCL 1 MG/ML IJ SOLN
0.5000 mg | Freq: Once | INTRAMUSCULAR | Status: AC
  Administered 2022-03-04: 0.5 mg via INTRAVENOUS
  Filled 2022-03-04: qty 1

## 2022-03-04 MED ORDER — ONDANSETRON HCL 4 MG/2ML IJ SOLN
4.0000 mg | Freq: Once | INTRAMUSCULAR | Status: AC
  Administered 2022-03-04: 4 mg via INTRAVENOUS
  Filled 2022-03-04: qty 2

## 2022-03-04 MED ORDER — LACTATED RINGERS IV BOLUS
1000.0000 mL | Freq: Once | INTRAVENOUS | Status: AC
  Administered 2022-03-04: 1000 mL via INTRAVENOUS

## 2022-03-04 MED ORDER — IOHEXOL 300 MG/ML  SOLN
100.0000 mL | Freq: Once | INTRAMUSCULAR | Status: AC | PRN
  Administered 2022-03-04: 100 mL via INTRAVENOUS

## 2022-03-04 MED ORDER — HYDROCODONE-ACETAMINOPHEN 5-325 MG PO TABS: 1.0000 | ORAL_TABLET | Freq: Four times a day (QID) | ORAL | 0 refills | Status: DC | PRN

## 2022-03-04 MED ORDER — HYDROMORPHONE HCL 1 MG/ML IJ SOLN
0.5000 mg | Freq: Once | INTRAMUSCULAR | Status: AC
Start: 2022-03-04 — End: 2022-03-04
  Administered 2022-03-04: 0.5 mg via INTRAVENOUS
  Filled 2022-03-04: qty 1

## 2022-03-04 MED ORDER — ONDANSETRON HCL 4 MG PO TABS: 4.0000 mg | ORAL_TABLET | Freq: Four times a day (QID) | ORAL | 0 refills | Status: DC

## 2022-03-04 NOTE — ED Notes (Signed)
Still unable to void at this time, ED MD to speak with pt/ family re: CT scan ?

## 2022-03-04 NOTE — ED Notes (Signed)
Patient transported to [facility] 

## 2022-03-04 NOTE — ED Provider Notes (Signed)
[facility] Provider Note   CSN: [id number] Arrival date & time: 03/04/22  1451     History  Chief Complaint  Patient presents with   Abdominal Pain    Cathy [name] is a 56 y.o. female.  Patient is a 56 year old female with a history of depression, migraines and vitamin B deficiency who is presenting today with severe abdominal pain.  Patient reports yesterday was a normal day she went to dinner last night was fine but throughout the night she was not able to sleep well because she had some mild abdominal discomfort.  Earlier this morning she woke up and had an episode of diarrhea and she reports since that time she has had severe abdominal pain in the right upper quadrant and epigastric region.  The pain is significant all the time but occasionally she will have sharp searing pain that she reports is worse than natural childbirth.  The pain has become so severe at times she gets nauseated but she has not had any vomiting.  She has had several episodes of diarrhea and reports it is affecting her hemorrhoids so she has had some bright red blood with it.  She denies any fever, lower abdominal pain or urinary symptoms.  No prior history of similar symptoms.  She has had a partial hysterectomy but no other abdominal surgeries.  She denies any shortness of breath or chest pain.  The history is provided by the patient.  Abdominal Pain     Home Medications Prior to Admission medications   Medication Sig Start Date End Date Taking? Authorizing Provider  HYDROcodone-acetaminophen (NORCO/VICODIN) 5-325 MG tablet Take 1 tablet by mouth every 6 (six) hours as needed for severe pain. 03/04/22  Yes Calin Ellery, Loree Fee, MD  ondansetron (ZOFRAN) 4 MG tablet Take 1 tablet (4 mg total) by mouth every 6 (six) hours. 03/04/22  Yes Margurete Guaman, Loree Fee, MD  cyanocobalamin (,VITAMIN B-12,) 1000 MCG/ML injection Inject 1 mL (1,000 mcg total) into the muscle every 14 (fourteen) days.  12/14/21   Kuneff, Renee A, DO  hydrOXYzine (ATARAX) 10 MG tablet Take 1-3 tablets (10-30 mg total) by mouth at bedtime. 12/14/21   Kuneff, Renee A, DO  SUMAtriptan (IMITREX) 50 MG tablet Take 1 tablet (50 mg total) by mouth every 2 (two) hours as needed for migraine. May repeat in 2 hours if headache persists or recurs. 12/14/21   Kuneff, Renee A, DO  Syringe/Needle, Disp, (SYRINGE 3CC/21GX1") 21G X 1" 3 ML MISC Inject 1 mL into the muscle every 14 (fourteen) days. Pt to inject 6m every 14 days intramuscular DX  E53.8 12/14/21   Kuneff, Renee A, DO  venlafaxine XR (EFFEXOR-XR) 75 MG 24 hr capsule Take 1 capsule (75 mg total) by mouth daily with breakfast. 12/14/21   Kuneff, Renee A, DO      Allergies    Macrobid [nitrofurantoin monohyd macro]    Review of Systems   Review of Systems  Gastrointestinal:  Positive for abdominal pain.   Physical Exam Updated Vital Signs BP 122/73 (BP Location: Left Arm)    Pulse 78    Temp 97.7 F (36.5 C) (Oral)    Resp 18    Ht '5\' 1"'$  (1.549 m)    Wt 87 kg    LMP 11/19/2014    SpO2 100%    BMI 36.24 kg/m  Physical Exam Vitals and nursing note reviewed.  Constitutional:      General: She is in acute distress.  Appearance: She is well-developed.     Comments: Patient appears very uncomfortable  HENT:     Head: Normocephalic and atraumatic.  Eyes:     Pupils: Pupils are equal, round, and reactive to light.  Cardiovascular:     Rate and Rhythm: Normal rate and regular rhythm.     Heart sounds: Normal heart sounds. No murmur heard.   No friction rub.  Pulmonary:     Effort: Pulmonary effort is normal.     Breath sounds: Normal breath sounds. No wheezing or rales.  Abdominal:     General: Bowel sounds are normal. There is no distension.     Palpations: Abdomen is soft.     Tenderness: There is abdominal tenderness in the right upper quadrant and epigastric area. There is guarding. There is no rebound. Positive signs include Murphy's sign.   Musculoskeletal:        General: No tenderness. Normal range of motion.     Comments: No edema  Skin:    General: Skin is warm and dry.     Findings: No rash.  Neurological:     Mental Status: She is alert and oriented to person, place, and time.     Cranial Nerves: No cranial nerve deficit.  Psychiatric:        Behavior: Behavior normal.    ED Results / Procedures / Treatments   Labs (all labs ordered are listed, but only abnormal results are displayed) Labs Reviewed  CBC WITH DIFFERENTIAL/PLATELET - Abnormal; Notable for the following components:      Result Value   WBC 13.1 (*)    Neutro Abs 10.7 (*)    All other components within normal limits  COMPREHENSIVE METABOLIC PANEL - Abnormal; Notable for the following components:   Glucose, Bld 110 (*)    All other components within normal limits  LIPASE, BLOOD  LACTIC ACID, PLASMA  URINALYSIS, ROUTINE W REFLEX MICROSCOPIC    EKG EKG Interpretation  Date/Time:  Sunday March 04 2022 15:13:10 EDT Ventricular Rate:  64 PR Interval:  142 QRS Duration: 88 QT Interval:  412 QTC Calculation: 426 R Axis:   57 Text Interpretation: Sinus rhythm Low voltage, precordial leads Normal ECG No previous tracing Confirmed by Blanchie Dessert [phone]) on 03/04/2022 4:06:23 PM  Radiology CT ABDOMEN PELVIS W CONTRAST  Result Date: 03/04/2022 CLINICAL DATA:  Abdominal pain, acute, nonlocalized.  Emesis. EXAM: CT ABDOMEN AND PELVIS WITH CONTRAST TECHNIQUE: Multidetector CT imaging of the abdomen and pelvis was performed using the standard protocol following bolus administration of intravenous contrast. RADIATION DOSE REDUCTION: This exam was performed according to the departmental dose-optimization program which includes automated exposure control, adjustment of the mA and/or kV according to patient size and/or use of iterative reconstruction technique. CONTRAST:  125m OMNIPAQUE IOHEXOL 300 MG/ML  SOLN COMPARISON:  None. FINDINGS: Lower chest:  No acute abnormality. Hepatobiliary: No focal liver abnormality. No gallstones, gallbladder wall thickening, or pericholecystic fluid. No biliary dilatation. Pancreas: No focal lesion. Normal pancreatic contour. No surrounding inflammatory changes. No main pancreatic ductal dilatation. Spleen: Normal in size without focal abnormality. Adrenals/Urinary Tract: No adrenal nodule bilaterally. Bilateral kidneys enhance symmetrically. No hydronephrosis. No hydroureter. The urinary bladder is unremarkable. Stomach/Bowel: Stomach is within normal limits. No evidence of large bowel wall thickening or dilatation. Long segment distal ileum small bowel wall thickening and mild vascular engorgement. Appendix appears normal. Vascular/Lymphatic: No abdominal aorta or iliac aneurysm. Mild atherosclerotic plaque of the aorta and its branches. No abdominal, pelvic, or inguinal  lymphadenopathy. Reproductive: Status post hysterectomy. No adnexal masses. Other: Trace volume free fluid within the pelvis. No intraperitoneal free gas. No organized fluid collection. Musculoskeletal: No abdominal wall hernia or abnormality. No suspicious lytic or blastic osseous lesions. No acute displaced fracture. IMPRESSION: Long segment ileitis. Differential diagnosis for etiology includes inflammation, infection, ischemia. Electronically Signed   By: Iven Finn M.D.   On: 03/04/2022 17:46   US Abdomen Limited RUQ (LIVER/GB)  Result Date: 03/04/2022 CLINICAL DATA:  Abdominal pain and nausea for 1 day. EXAM: ULTRASOUND ABDOMEN LIMITED RIGHT UPPER QUADRANT COMPARISON:  CT 10/05/2017 FINDINGS: Gallbladder: No gallstones or wall thickening visualized. No sonographic Murphy sign noted by sonographer. Common bile duct: Diameter: Normal, 2 mm. Liver: No focal lesion identified. Within normal limits in parenchymal echogenicity. Portal vein is patent on color Doppler imaging with normal direction of blood flow towards the liver. Other: None. IMPRESSION:  No acute process or explanation for abdominal pain. Electronically Signed   By: Abigail Miyamoto M.D.   On: 03/04/2022 16:38    Procedures Procedures    Medications Ordered in ED Medications  HYDROmorphone (DILAUDID) injection 0.5 mg (has no administration in time range)  HYDROmorphone (DILAUDID) injection 0.5 mg (0.5 mg Intravenous Given 03/04/22 1527)  ondansetron (ZOFRAN) injection 4 mg (4 mg Intravenous Given 03/04/22 1527)  lactated ringers bolus 1,000 mL (0 mLs Intravenous Stopped 03/04/22 1658)  iohexol (OMNIPAQUE) 300 MG/ML solution 100 mL (100 mLs Intravenous Contrast Given 03/04/22 1723)    ED Course/ Medical Decision Making/ A&P                           Medical Decision Making Amount and/or Complexity of Data Reviewed Labs: ordered. Radiology: ordered.  Risk Prescription drug management.   Relatively healthy 56 year old female presenting today with abdominal pain that has been present for the last 12 hours.  It is associated with diarrhea and nausea.  Denies any fever.  Based on patient's exam concern for cholecystitis/choledocholithiasis, hepatitis, pancreatitis, gastritis or perforated viscus.  Low suspicion for appendicitis, diverticulitis, pyelonephritis, renal stone or acute ovarian pathology.  Patient denies any chest pain or shortness of breath and low suspicion for cardiac or respiratory symptoms.  I independently interpreted patient's EKG and labs.  EKG is within normal limits.  Labs with leukocytosis of 13,000 on CBC but otherwise normal CMP, lipase, lactate. Patient given IV fluids, pain and nausea medication.  6:17 PM Patient's pain is improved after pain medication on reevaluation.  I independently visualized and interpreted patient's ultrasound which looks normal.  Radiology report no acute findings.  Patient is still having tenderness in the epigastric area with palpation.  Discussed with her CT due to no specific answers and mild leukocytosis to look for atypical  appendicitis, masses or diverticulitis.  Patient is okay with this plan.  She reports she does not need any further pain medication at this time. 6:17 PM I independently visualized and interpreted patient's CT and there is no evidence of hydronephrosis.  Radiology reports The lungs meant of ileitis which is most likely infectious in origin given the sudden nature of her symptoms.  Findings were discussed with the patient.  Symptoms are improved.  She is tolerating p.o.'s.  Will discharge home with supportive care and follow-up with her PCP if symptoms or not improving.  She was given return precautions.  Patient and her husband are comfortable with this plan.  She at this time does not meet any criteria for  admission.        Final Clinical Impression(s) / ED Diagnoses Final diagnoses:  RUQ abdominal pain  Ileitis    Rx / DC Orders ED Discharge Orders          Ordered    HYDROcodone-acetaminophen (NORCO/VICODIN) 5-325 MG tablet  Every 6 hours PRN        03/04/22 1815    ondansetron (ZOFRAN) 4 MG tablet  Every 6 hours        03/04/22 1815              Blanchie Dessert, MD 03/04/22 1817

## 2022-03-04 NOTE — ED Notes (Signed)
Having intermittent sharp abd pain with onset at approx 0330hrs this, having nausea, denies fever, diarrhea or vomiting. Abdomen soft ?

## 2022-03-04 NOTE — ED Triage Notes (Signed)
Pt reports sharp mid abd pain today with diarrhea. States she has nausea when the pain hits. Emesis x 4  ?

## 2022-03-04 NOTE — ED Notes (Signed)
Safety measures in place due opioid given for pain, instructed to remain NPO, also not to get up without staff in room.  ?

## 2022-03-04 NOTE — Discharge Instructions (Addendum)
Today you were found to have inflammation in your small intestine which is most likely coming from a virus.  It should resolve itself in the next 3 to 4 days but you may have some lingering diarrhea.  If you have ongoing pain beyond Wednesday of this week you should call your doctor for a follow-up appointment.  If you start getting high fevers, severe pain that is not controlled with the medication, persistent vomiting and unable to hold anything down return to the emergency room.  Eat a very bland diet like bananas, applesauce, toast, chicken broth and make sure you are drinking plenty of fluids.  When you start feeling better you can slowly go back to your normal diet. ?

## 2022-03-07 ENCOUNTER — Ambulatory Visit: Payer: BC Managed Care – PPO | Admitting: Family Medicine

## 2022-03-08 ENCOUNTER — Telehealth: Payer: Self-pay | Admitting: Family Medicine

## 2022-03-08 NOTE — Telephone Encounter (Signed)
Scanning Department sent back 2 pages of pt  info on pap smear. ? ?They needed the date of the procedure. ?I contacted [facility] [place], they informed my pt pap smear pre op was done on 12/20/2014.  ?

## 2022-03-12 ENCOUNTER — Other Ambulatory Visit: Payer: Self-pay

## 2022-03-12 ENCOUNTER — Encounter: Payer: Self-pay | Admitting: Family Medicine

## 2022-03-12 ENCOUNTER — Ambulatory Visit (INDEPENDENT_AMBULATORY_CARE_PROVIDER_SITE_OTHER): Payer: BC Managed Care – PPO | Admitting: Family Medicine

## 2022-03-12 VITALS — BP 113/72 | HR 53 | Temp 97.3°F | Ht 61.0 in

## 2022-03-12 DIAGNOSIS — K649 Unspecified hemorrhoids: Secondary | ICD-10-CM | POA: Diagnosis not present

## 2022-03-12 DIAGNOSIS — R194 Change in bowel habit: Secondary | ICD-10-CM

## 2022-03-12 DIAGNOSIS — K625 Hemorrhage of anus and rectum: Secondary | ICD-10-CM | POA: Diagnosis not present

## 2022-03-12 DIAGNOSIS — R1084 Generalized abdominal pain: Secondary | ICD-10-CM

## 2022-03-12 LAB — COMPREHENSIVE METABOLIC PANEL
ALT: 19 U/L (ref 0–35)
AST: 14 U/L (ref 0–37)
Albumin: 4.3 g/dL (ref 3.5–5.2)
Alkaline Phosphatase: 70 U/L (ref 39–117)
BUN: 14 mg/dL (ref 6–23)
CO2: 30 mEq/L (ref 19–32)
Calcium: 9 mg/dL (ref 8.4–10.5)
Chloride: 104 mEq/L (ref 96–112)
Creatinine, Ser: 0.79 mg/dL (ref 0.40–1.20)
GFR: 84.63 mL/min (ref >60.00)
Glucose, Bld: 84 mg/dL (ref 70–99)
Potassium: 3.8 mEq/L (ref 3.5–5.1)
Sodium: 140 mEq/L (ref 135–145)
Total Bilirubin: 0.4 mg/dL (ref 0.2–1.2)
Total Protein: 6.4 g/dL (ref 6.0–8.3)

## 2022-03-12 LAB — CBC WITH DIFFERENTIAL/PLATELET
Basophils Absolute: 0 10*3/uL (ref 0.0–0.1)
Basophils Relative: 0.5 % (ref 0.0–3.0)
Eosinophils Absolute: 0.2 10*3/uL (ref 0.0–0.7)
Eosinophils Relative: 2.8 % (ref 0.0–5.0)
HCT: 38.9 % (ref 36.0–46.0)
Hemoglobin: 13.1 g/dL (ref 12.0–15.0)
Lymphocytes Relative: 28.1 % (ref 12.0–46.0)
Lymphs Abs: 1.8 10*3/uL (ref 0.7–4.0)
MCHC: 33.6 g/dL (ref 30.0–36.0)
MCV: 94.8 fl (ref 78.0–100.0)
Monocytes Absolute: 0.4 10*3/uL (ref 0.1–1.0)
Monocytes Relative: 5.5 % (ref 3.0–12.0)
Neutro Abs: 4.1 10*3/uL (ref 1.4–7.7)
Neutrophils Relative %: 63.1 % (ref 43.0–77.0)
Platelets: 251 10*3/uL (ref 150.0–400.0)
RBC: 4.11 Mil/uL (ref 3.87–5.11)
RDW: 13.5 % (ref 11.5–15.5)
WBC: 6.5 10*3/uL (ref 4.0–10.5)

## 2022-03-12 LAB — C-REACTIVE PROTEIN: CRP: <1 mg/dL (ref 0.5–20.0)

## 2022-03-12 MED ORDER — HYDROCORTISONE ACETATE 25 MG RE SUPP: 25.0000 mg | Freq: Two times a day (BID) | RECTAL | 0 refills | Status: DC

## 2022-03-12 MED ORDER — ONDANSETRON HCL 4 MG PO TABS: 4.0000 mg | ORAL_TABLET | Freq: Three times a day (TID) | ORAL | 0 refills | Status: DC | PRN

## 2022-03-12 NOTE — Progress Notes (Signed)
? ? ? ?This visit occurred during the SARS-CoV-2 public health emergency.  Safety protocols were in place, including screening questions prior to the visit, additional usage of staff PPE, and extensive cleaning of exam room while observing appropriate contact time as indicated for disinfecting solutions.  ? ? ?LYNIAH FUJITA , 11-22-1966, 56 y.o., female ?MRN: [id number] ?Patient Care Team  ?  Relationship Specialty Notifications Start End  ?Ma Hillock, DO PCP - General Family Medicine  10/07/17   ?Lorretta Harp, MD Consulting Physician Cardiology  09/27/17   ?Paula Compton, MD Consulting Physician Obstetrics and Gynecology  09/27/17   ? ? ?Chief Complaint  ?Patient presents with  ? Abdominal Pain  ?  Ed f/u; pain improved pt reports no change in rectal bleeding, constipation and diarrhea  ? ?  ?Subjective: Pt presents for an OV with complaints of abdominal pain since 03/04/2022.  She reports she woke up that morning and had rather significant abdominal pain.  She went to the emergency room.  She was having rather significant watery diarrhea at that time.  She states her pain would wax and wane every 2-3 minutes with a strong cramping pain.  She states it was worse than labor pains. ?Seen in ED and dx with ileitis per CT. Prescribed narcotic for pain.  ?Since being seen in the emergency room she has had both constipation and diarrhea.  She continues to have bright red blood per rectum with stool passage.  Stool passage is painful.  She is currently not using anything for her hemorrhoid discomfort.  She has never seen a gastroenterologist.  She denies any fevers or chills.   ? ? ?Depression screen [facility] 2/9 02/08/2022 12/14/2021 03/02/2019 08/26/2018 09/27/2017  ?Decreased Interest '1 2 2 '$ 0 0  ?Down, Depressed, Hopeless '1 1 3 '$ 0 1  ?PHQ - 2 Score '2 3 5 '$ 0 1  ?Altered sleeping '2 2 2 '$ - -  ?Tired, decreased energy '2 2 2 '$ - -  ?Change in appetite '3 3 2 '$ - -  ?Feeling bad or failure about yourself  '1 1 3 '$ - -  ?Trouble  concentrating '1 2 2 '$ - -  ?Moving slowly or fidgety/restless 0 1 1 - -  ?Suicidal thoughts 0 0 0 - -  ?PHQ-9 Score '11 14 17 '$ - -  ?Difficult doing work/chores - - Very difficult - -  ? ? ?Allergies  ?Allergen Reactions  ? Macrobid [Nitrofurantoin Monohyd Macro] Anaphylaxis  ? ?Social History  ? ?Social History Narrative  ? Divorced. 3 children. Bachelors degree. Works as an Optometrist.  ? Exercises routinely.  ? Wears her seatbelt. Wears a bicycle helmet. Smoke detector in the home.  ? Feels safe in her relationships.  ? ?Past Medical History:  ?Diagnosis Date  ? Depression   ? Dyspnea on exertion   ? With atypical chest pain, followed by cardiology.  ? Genital warts 06/2016  ? TCA application by [facility]  ? History of frequent urinary tract infections   ? Juvenile epilepsy ([place])   ? Migraines   ? Seizures ([place])   ? none since 1987- classified as epileptic seizures  ? Thrombosed external hemorrhoid   ? ?Past Surgical History:  ?Procedure Laterality Date  ? BILATERAL SALPINGECTOMY Bilateral 12/20/2014  ? Procedure: BILATERAL SALPINGECTOMY;  Surgeon: Logan Bores, MD;  Location: [facility];  Service: Gynecology;  Laterality: Bilateral;  ? HEMORRHOID SURGERY    ? thrombosed hemorrhoid  ? LAPAROSCOPIC ASSISTED VAGINAL HYSTERECTOMY N/A 12/20/2014  ? Procedure: LAPAROSCOPIC  ASSISTED VAGINAL HYSTERECTOMY;  Surgeon: Logan Bores, MD;  Location: [facility];  Service: Gynecology;  Laterality: N/A;  ? tendon repair Right 2011  ? right ankle  ? TONSILLECTOMY  1973  ? ?Family History  ?Problem Relation Age of Onset  ? Hyperlipidemia Mother   ? Heart disease Mother   ? Hypertension Mother   ? Ovarian cancer Mother   ? Cervical cancer Mother   ? Heart attack Mother   ? Depression Sister   ? Asthma Sister   ? Asthma Son   ? Dementia Maternal Grandmother   ? Heart attack Maternal Grandmother   ? Heart attack Maternal Grandfather   ? Heart attack Paternal Grandmother   ? ?Allergies as of 03/12/2022   ? ?   Reactions  ? Macrobid  [nitrofurantoin Monohyd Macro] Anaphylaxis  ? ?  ? ?  ?Medication List  ?  ? ?  ? Accurate as of March 12, 2022  1:16 PM. If you have any questions, ask your nurse or doctor.  ?  ?  ? ?  ? ?STOP taking these medications   ? ?hydrOXYzine 10 MG tablet ?Commonly known as: ATARAX ?Stopped by: Howard Pouch, DO ?  ? ?  ? ?TAKE these medications   ? ?cyanocobalamin 1000 MCG/ML injection ?Commonly known as: (VITAMIN B-12) ?Inject 1 mL (1,000 mcg total) into the muscle every 14 (fourteen) days. ?  ?HYDROcodone-acetaminophen 5-325 MG tablet ?Commonly known as: NORCO/VICODIN ?Take 1 tablet by mouth every 6 (six) hours as needed for severe pain. ?  ?hydrocortisone 25 MG suppository ?Commonly known as: ANUSOL-HC ?Place 1 suppository (25 mg total) rectally 2 (two) times daily. ?Started by: Howard Pouch, DO ?  ?ondansetron 4 MG tablet ?Commonly known as: ZOFRAN ?Take 1 tablet (4 mg total) by mouth every 6 (six) hours. ?What changed: Another medication with the same name was added. Make sure you understand how and when to take each. ?Changed by: Howard Pouch, DO ?  ?ondansetron 4 MG tablet ?Commonly known as: ZOFRAN ?Take 1 tablet (4 mg total) by mouth every 8 (eight) hours as needed for nausea or vomiting. ?What changed: You were already taking a medication with the same name, and this prescription was added. Make sure you understand how and when to take each. ?Changed by: Howard Pouch, DO ?  ?SUMAtriptan 50 MG tablet ?Commonly known as: Imitrex ?Take 1 tablet (50 mg total) by mouth every 2 (two) hours as needed for migraine. May repeat in 2 hours if headache persists or recurs. ?  ?SYRINGE 3CC/21GX1" 21G X 1" 3 ML Misc ?Inject 1 mL into the muscle every 14 (fourteen) days. Pt to inject 26m every 14 days intramuscular DX  E53.8 ?  ?venlafaxine XR 75 MG 24 hr capsule ?Commonly known as: EFFEXOR-XR ?Take 1 capsule (75 mg total) by mouth daily with breakfast. ?  ? ?  ? ? ?All past medical history, surgical history, allergies, family  history, immunizations andmedications were updated in the EMR today and reviewed under the history and medication portions of their EMR.    ? ?ROS ?Negative, with the exception of above mentioned in HPI ? ? ?Objective:  ?BP 113/72   Pulse (!) 53   Temp (!) 97.3 ?F (36.3 ?C) (Oral)   Ht '5\' 1"'$  (1.549 m)   LMP 11/19/2014   SpO2 100%   BMI 36.24 kg/m?  ?Body mass index is 36.24 kg/m?.Marland Kitchen?Physical Exam ?Vitals and nursing note reviewed.  ?Constitutional:   ?   General: She  is not in acute distress. ?   Appearance: Normal appearance. She is normal weight. She is not ill-appearing or toxic-appearing.  ?Eyes:  ?   Extraocular Movements: Extraocular movements intact.  ?   Conjunctiva/sclera: Conjunctivae normal.  ?   Pupils: Pupils are equal, round, and reactive to light.  ?Cardiovascular:  ?   Rate and Rhythm: Normal rate and regular rhythm.  ?Pulmonary:  ?   Effort: Pulmonary effort is normal. No respiratory distress.  ?   Breath sounds: Normal breath sounds. No wheezing, rhonchi or rales.  ?Abdominal:  ?   General: Bowel sounds are normal. There is distension.  ?   Palpations: Abdomen is soft.  ?   Tenderness: There is generalized abdominal tenderness. There is no right CVA tenderness, left CVA tenderness, guarding or rebound.  ?Neurological:  ?   Mental Status: She is alert and oriented to person, place, and time. Mental status is at baseline.  ?Psychiatric:     ?   Mood and Affect: Mood normal.     ?   Behavior: Behavior normal.     ?   Thought Content: Thought content normal.     ?   Judgment: Judgment normal.  ? ? ? ?No results found. ?No results found. ?No results found for this or any previous visit (from the past 24 hour(s)). ? ?Assessment/Plan: ?Evva L Nolden is a 56 y.o. female present for OV for  ?Generalized abdominal pain/bowel habit changes ?No personal or family history of IBD, although she states her mother has similar symptoms throughout her life.  CT resulted with ileitis, would have hoped that would  have started to improve by now.  Discussed different dual diagnosis with her today and we will start work-up while we get her seen by gastroenterology for colonoscopy. ?Patient was encouraged to take a daily stool softener.

## 2022-03-12 NOTE — Patient Instructions (Addendum)
Colace stool softener daily recommended.  ?We will call you with results. ?Complete stool kit.  ? ? ?Constipation, Adult ?Constipation is when a person has fewer than three bowel movements in a week, has difficulty having a bowel movement, or has stools (feces) that are dry, hard, or larger than normal. Constipation may be caused by an underlying condition. It may become worse with age if a person takes certain medicines and does not take in enough fluids. ?Follow these instructions at home: ?Eating and drinking ? ?Eat foods that have a lot of fiber, such as beans, whole grains, and fresh fruits and vegetables. ?Limit foods that are low in fiber and high in fat and processed sugars, such as fried or sweet foods. These include french fries, hamburgers, cookies, candies, and soda. ?Drink enough fluid to keep your urine pale yellow. ?General instructions ?Exercise regularly or as told by your health care provider. Try to do 150 minutes of moderate exercise each week. ?Use the bathroom when you have the urge to go. Do not hold it in. ?Take over-the-counter and prescription medicines only as told by your health care provider. This includes any fiber supplements. ?During bowel movements: ?Practice deep breathing while relaxing the lower abdomen. ?Practice pelvic floor relaxation. ?Watch your condition for any changes. Let your health care provider know about them. ?Keep all follow-up visits as told by your health care provider. This is important. ?Contact a health care provider if: ?You have pain that gets worse. ?You have a fever. ?You do not have a bowel movement after 4 days. ?You vomit. ?You are not hungry or you lose weight. ?You are bleeding from the opening between the buttocks (anus). ?You have thin, pencil-like stools. ?Get help right away if: ?You have a fever and your symptoms suddenly get worse. ?You leak stool or have blood in your stool. ?Your abdomen is bloated. ?You have severe pain in your abdomen. ?You  feel dizzy or you faint. ?Summary ?Constipation is when a person has fewer than three bowel movements in a week, has difficulty having a bowel movement, or has stools (feces) that are dry, hard, or larger than normal. ?Eat foods that have a lot of fiber, such as beans, whole grains, and fresh fruits and vegetables. ?Drink enough fluid to keep your urine pale yellow. ?Take over-the-counter and prescription medicines only as told by your health care provider. This includes any fiber supplements. ?This information is not intended to replace advice given to you by your health care provider. Make sure you discuss any questions you have with your health care provider. ?Document Revised: 10/28/2019 Document Reviewed: 10/28/2019 ?Elsevier Patient Education ? 2022 [employer]. ? ? ?

## 2022-03-15 LAB — TIQ-NTM

## 2022-03-15 LAB — GASTROINTESTINAL PATHOGEN PNL

## 2022-03-16 NOTE — Progress Notes (Signed)
? ? ? ?03/16/2022 ?Cathy [name] ?[id number] ?1966/06/25 ? ? ?CHIEF COMPLAINT: Abdominal pain, rectal bleeding ? ?HISTORY OF PRESENT ILLNESS:  Cathy [name] is a 56 year old female with a past medical history of depression, juvenile epilepsy (last seizure age 67), vitamin D deficiency and migraine headaches.  Partial hysterectomy.  ? ?She presents to our office today by Dr. Howard Pouch for further evaluation regarding generalized abdominal pain, change in bowel pattern and rectal bleeding. She presented to [facility] ED on 03/04/2022 due to having abdominal pain and diarrhea. She awakened abruptly with lower abdominal pain with multiple episodes of diarrhea throughout the night.  No obvious food triggers.  After several episodes of diarrhea, she felt her hemorrhoids were inflamed and started passing bright red blood per the rectum. Labs in the ED showed a WBC count of 13.1.  Hemoglobin 14.7.  Hematocrit 43.2.  Platelet 266.  CMP was unremarkable.  RUQ sonogram was normal.  CTAP with contrast showed a long segment of ileitis (inflammatory versus infectious versus ischemic etiology).  She received IV fluids and Hydrocodone and her abdominal pain improved.  The ED physician thought her ileitis was most likely infectious and self-limited  therefore antibiotics were not prescribed.  She was discharged home with instructions to follow-up with her PCP.  She was seen by Dr. Raoul Pitch on 03/12/2022, at that time, she reported having alternating diarrhea and constipation with bright red blood with most bowel movements.  She continued to have lower abdominal pain but was much less when compared to the abdominal pain she experienced the day she went to the ED.  A GI pathogen panel was ordered, the patient states she completed it but the results are not in epic.  Labs were repeated which showed a normal CBC and CRP.  She was prescribed Anusol 25 mg suppositories for her hemorrhoids and she was advised to undergo further GI  evaluation. ? ?She reported having vomiting episodes, described as partially digested food then bilious multiple episodes for 2 days after she left the ED without recurrence.  She is having worse constipation for the past 2 weeks. She  is taking 6 Colace tablets which results in passing a loose bowel movement most days. She is passing a small solid stool if she does not take Colace. She continues to see a small amount of bright red or dark or red blood with her bowel movements and in the toilet water.  She has a history of constipation over the past year which she attributes to a 12 pound weight gain.  She has noticed in the past when her weight is up she develops constipation.  Prior to going to the ED, she could go 3 to 5 days without passing a bowel movement.  She denies ever having a colonoscopy.  No known family history of IBD or colorectal cancer. ? ?She also complains of having dysphagia.  She describes having food such as chicken or other meat which gets hung up in the throat or upper esophagus which occurs once or twice monthly for the past 4 to 6 months.  She was recently at [employer] and food became stuck briefly before she gagged and expel the stuck food out.  She has heartburn approximately twice weekly for the past 4 months.  No upper abdominal pain.  She rarely takes Excedrin migraine for headaches.  No other NSAID use. ? ? ?  Latest Ref Rng & Units 03/12/2022  ?  9:14 AM 03/04/2022  ?  3:30 PM 02/08/2022  ?  9:39 AM  ?CBC  ?WBC 4.0 - 10.5 K/uL 6.5   13.1   6.0    ?Hemoglobin 12.0 - 15.0 g/dL 13.1   14.7   13.9    ?Hematocrit 36.0 - 46.0 % 38.9   43.2   41.9    ?Platelets 150.0 - 400.0 K/uL 251.0   266   230.0    ?  ? ?  Latest Ref Rng & Units 03/12/2022  ?  9:14 AM 03/04/2022  ?  3:30 PM 02/08/2022  ?  9:39 AM  ?CMP  ?Glucose 70 - 99 mg/dL 84   110   96    ?BUN 6 - 23 mg/dL '14   11   10    '$ ?Creatinine 0.40 - 1.20 mg/dL 0.79   0.94   0.85    ?Sodium 135 - 145 mEq/L 140   139   139    ?Potassium 3.5 - 5.1  mEq/L 3.8   3.7   4.1    ?Chloride 96 - 112 mEq/L 104   105   102    ?CO2 19 - 32 mEq/L 30   23   34    ?Calcium 8.4 - 10.5 mg/dL 9.0   9.4   9.6    ?Total Protein 6.0 - 8.3 g/dL 6.4   7.7   6.7    ?Total Bilirubin 0.2 - 1.2 mg/dL 0.4   0.4   0.5    ?Alkaline Phos 39 - 117 U/L 70   78   80    ?AST 0 - 37 U/L '14   23   15    '$ ?ALT 0 - 35 U/L '19   28   19    '$ ? CRN < 1.0. ? ?RUQ sonogram 03/04/2022: ?ULTRASOUND ABDOMEN LIMITED RIGHT UPPER QUADRANT ?  ?COMPARISON:  CT 10/05/2017 ?  ?FINDINGS: ?Gallbladder:No gallstones or wall thickening visualized. No sonographic Percell Miller ?sign noted by sonographer. ?  ?Common bile duct:Diameter: Normal, 2 mm. ?  ?Liver:No focal lesion identified. Within normal limits in parenchymal ?echogenicity. Portal vein is patent on color Doppler imaging with ?normal direction of blood flow towards the liver. ?   ?IMPRESSION: ?No acute process or explanation for abdominal pain ? ?CTAP with contrast 03/04/2022: ? ?FINDINGS: ?Lower chest: No acute abnormality. ?  ?Hepatobiliary: No focal liver abnormality. No gallstones, ?gallbladder wall thickening, or pericholecystic fluid. No biliary ?dilatation. ?  ?Pancreas: No focal lesion. Normal pancreatic contour. No surrounding ?inflammatory changes. No main pancreatic ductal dilatation. ?  ?Spleen: Normal in size without focal abnormality. ?  ?Adrenals/Urinary Tract: ?  ?No adrenal nodule bilaterally. ?  ?Bilateral kidneys enhance symmetrically. ?  ?No hydronephrosis. No hydroureter. ?  ?The urinary bladder is unremarkable. ?  ?Stomach/Bowel: Stomach is within normal limits. No evidence of large ?bowel wall thickening or dilatation. Long segment distal ileum small ?bowel wall thickening and mild vascular engorgement. Appendix ?appears normal. ?  ?Vascular/Lymphatic: No abdominal aorta or iliac aneurysm. Mild ?atherosclerotic plaque of the aorta and its branches. No abdominal, ?pelvic, or inguinal lymphadenopathy. ?  ?Reproductive: Status post hysterectomy. No  adnexal masses. ?  ?Other: Trace volume free fluid within the pelvis. No intraperitoneal ?free gas. No organized fluid collection. ?  ?Musculoskeletal: ?  ?No abdominal wall hernia or abnormality. ?  ?No suspicious lytic or blastic osseous lesions. No acute displaced ?fracture. ?  ?IMPRESSION: ?Long segment ileitis. Differential diagnosis for etiology includes ?inflammation, infection, ischemia. ?  ? ?Past Medical History:  ?  Diagnosis Date  ? Depression   ? Dyspnea on exertion   ? With atypical chest pain, followed by cardiology.  ? Genital warts 06/2016  ? TCA application by [facility]  ? History of frequent urinary tract infections   ? Juvenile epilepsy ([place])   ? Migraines   ? Seizures ([place])   ? none since 1987- classified as epileptic seizures  ? Thrombosed external hemorrhoid   ? ?Past Surgical History:  ?Procedure Laterality Date  ? BILATERAL SALPINGECTOMY Bilateral 12/20/2014  ? Procedure: BILATERAL SALPINGECTOMY;  Surgeon: Logan Bores, MD;  Location: [facility];  Service: Gynecology;  Laterality: Bilateral;  ? HEMORRHOID SURGERY    ? thrombosed hemorrhoid  ? LAPAROSCOPIC ASSISTED VAGINAL HYSTERECTOMY N/A 12/20/2014  ? Procedure: LAPAROSCOPIC ASSISTED VAGINAL HYSTERECTOMY;  Surgeon: Logan Bores, MD;  Location: [facility];  Service: Gynecology;  Laterality: N/A;  ? tendon repair Right 2011  ? right ankle  ? TONSILLECTOMY  1973  ? ?Social History: She is divorced.  She has 3 sons.  She is an Optometrist.  Non-smoker.  No alcohol use.  No drug use ? ?Family History: Mother with history of ovarian cancer and heart disease.  Maternal grandmother with dementia.  Sister with asthma and depression.  No known family history of esophageal, gastric or colon cancer ? ? ?Allergies  ?Allergen Reactions  ? Macrobid [Nitrofurantoin Monohyd Macro] Anaphylaxis  ? ? ?  ?Outpatient Encounter Medications as of 03/20/2022  ?Medication Sig  ? cyanocobalamin (,VITAMIN B-12,) 1000 MCG/ML injection Inject 1 mL (1,000 mcg total) into the  muscle every 14 (fourteen) days.  ? HYDROcodone-acetaminophen (NORCO/VICODIN) 5-325 MG tablet Take 1 tablet by mouth every 6 (six) hours as needed for severe pain.  ? hydrocortisone (ANUSOL-HC) 25 MG

## 2022-03-20 ENCOUNTER — Other Ambulatory Visit (INDEPENDENT_AMBULATORY_CARE_PROVIDER_SITE_OTHER): Payer: BC Managed Care – PPO

## 2022-03-20 ENCOUNTER — Encounter: Payer: Self-pay | Admitting: Nurse Practitioner

## 2022-03-20 ENCOUNTER — Ambulatory Visit: Payer: BC Managed Care – PPO | Admitting: Nurse Practitioner

## 2022-03-20 VITALS — BP 120/78 | HR 60 | Ht 61.0 in | Wt 186.0 lb

## 2022-03-20 DIAGNOSIS — R197 Diarrhea, unspecified: Secondary | ICD-10-CM

## 2022-03-20 DIAGNOSIS — K529 Noninfective gastroenteritis and colitis, unspecified: Secondary | ICD-10-CM

## 2022-03-20 DIAGNOSIS — R1310 Dysphagia, unspecified: Secondary | ICD-10-CM | POA: Diagnosis not present

## 2022-03-20 DIAGNOSIS — K5900 Constipation, unspecified: Secondary | ICD-10-CM | POA: Diagnosis not present

## 2022-03-20 DIAGNOSIS — R1030 Lower abdominal pain, unspecified: Secondary | ICD-10-CM

## 2022-03-20 LAB — CBC WITH DIFFERENTIAL/PLATELET
Basophils Absolute: 0.1 10*3/uL (ref 0.0–0.1)
Basophils Relative: 1.1 % (ref 0.0–3.0)
Eosinophils Absolute: 0.2 10*3/uL (ref 0.0–0.7)
Eosinophils Relative: 3.1 % (ref 0.0–5.0)
HCT: 41.1 % (ref 36.0–46.0)
Hemoglobin: 13.6 g/dL (ref 12.0–15.0)
Lymphocytes Relative: 27.9 % (ref 12.0–46.0)
Lymphs Abs: 1.8 10*3/uL (ref 0.7–4.0)
MCHC: 33.1 g/dL (ref 30.0–36.0)
MCV: 94.5 fl (ref 78.0–100.0)
Monocytes Absolute: 0.4 10*3/uL (ref 0.1–1.0)
Monocytes Relative: 6.7 % (ref 3.0–12.0)
Neutro Abs: 3.9 10*3/uL (ref 1.4–7.7)
Neutrophils Relative %: 61.2 % (ref 43.0–77.0)
Platelets: 232 10*3/uL (ref 150.0–400.0)
RBC: 4.36 Mil/uL (ref 3.87–5.11)
RDW: 13.4 % (ref 11.5–15.5)
WBC: 6.4 10*3/uL (ref 4.0–10.5)

## 2022-03-20 MED ORDER — NA SULFATE-K SULFATE-MG SULF 17.5-3.13-1.6 GM/177ML PO SOLN: 1.0000 | ORAL | 0 refills | Status: DC

## 2022-03-20 MED ORDER — DICYCLOMINE HCL 10 MG PO CAPS: 10.0000 mg | ORAL_CAPSULE | Freq: Three times a day (TID) | ORAL | 0 refills | Status: DC | PRN

## 2022-03-20 NOTE — Patient Instructions (Addendum)
?  1) Push fluids, bland diet ? ?2) Apply a small amount of Desitin inside the anal opening and to the external anal area tid as needed for anal or hemorrhoidal irritation/bleeding.  ? ?3) Dicyclomine '10mg'$  one tab by mouth every 8 hours as needed for abdominal pain  ? ?4) Contact our office if your symptoms worsen ? ?You have been scheduled for an EGD and Colonoscopy. Please follow the written instructions given to you at your visit today. ?Please pick up your prep supplies at the pharmacy within the next 1-3 days. ?If you use inhalers (even only as needed), please bring them with you on the day of your procedure. ? ?Please proceed to the basement level for lab work before leaving today. Press "B" on the elevator. The lab is located at the first door on the left as you exit the elevator. ? ?HEALTHCARE LAWS AND MY CHART RESULTS:  ? ?Due to recent changes in healthcare laws, you may see results of your imaging and/or laboratory studies on MyChart before I have had a chance to review them.  I understand that in some cases there may be results that are confusing or concerning to you. Please understand that not all results are received at the same time and often I may need to interpret multiple results in order to provide you with the best plan of care or course of treatment. Therefore, I ask that you please give me 48 hours to thoroughly review all your results before contacting my office for clarification.  ? ?Thank you for trusting me with your gastrointestinal care!   ? ?Noralyn Pick, CRNP ? ? ? ?BMI: ? ?If you are age 55 or older, your body mass index should be between 23-30. Your Body mass index is 35.14 kg/m?Marland Kitchen If this is out of the aforementioned range listed, please consider follow up with your Primary Care Provider. ? ?If you are age 55 or younger, your body mass index should be between 19-25. Your Body mass index is 35.14 kg/m?Marland Kitchen If this is out of the aformentioned range listed, please consider follow  up with your Primary Care Provider.  ? ?MY CHART: ? ?The [facility] providers would like to encourage you to use [facility] to communicate with providers for non-urgent requests or questions.  Due to long hold times on the telephone, sending your provider a message by [facility] may be a faster and more efficient way to get a response.  Please allow 48 business hours for a response.  Please remember that this is for non-urgent requests.  ? ?

## 2022-03-21 ENCOUNTER — Telehealth: Payer: Self-pay

## 2022-03-21 LAB — IGA: Immunoglobulin A: 66 mg/dL (ref 47–310)

## 2022-03-21 LAB — TISSUE TRANSGLUTAMINASE ABS,IGG,IGA
(tTG) Ab, IgA: <1 U/mL
(tTG) Ab, IgG: <1 U/mL

## 2022-03-21 NOTE — Telephone Encounter (Signed)
Stool sample was contaminated. Spoke with pt and she said that she is doing better than she was but still has tenderness and is on a bland diet. She states that she feels okay when she doesn't eat. When she eats she starts to have the abdominal pain and constipation some times. ?

## 2022-03-21 NOTE — Telephone Encounter (Signed)
-----   Message from Ma Hillock, DO sent at 03/21/2022 12:22 PM EDT ----- ?Regarding: stool result? ?Can you check into the stool results?  We ordered the GI pathogen panel? ? ?Dr. Raliegh Ip ? ?

## 2022-03-21 NOTE — Telephone Encounter (Signed)
Noted. ?She can follow with her GI team for further concerns. ?

## 2022-03-21 NOTE — Progress Notes (Signed)
Agree with the assessment and plan as outlined by Colleen Kennedy-Smith, NP.    Alora Gorey E. Gary Bultman, MD [facility]  

## 2022-03-24 HISTORY — PX: COLONOSCOPY: SHX174

## 2022-04-02 ENCOUNTER — Other Ambulatory Visit: Payer: BC Managed Care – PPO

## 2022-04-02 DIAGNOSIS — R197 Diarrhea, unspecified: Secondary | ICD-10-CM

## 2022-04-02 DIAGNOSIS — K529 Noninfective gastroenteritis and colitis, unspecified: Secondary | ICD-10-CM | POA: Diagnosis not present

## 2022-04-04 LAB — GI PROFILE, STOOL, PCR

## 2022-04-05 LAB — CALPROTECTIN, FECAL: Calprotectin, Fecal: 20 ug/g (ref 0–120)

## 2022-04-11 ENCOUNTER — Encounter: Payer: Self-pay | Admitting: Gastroenterology

## 2022-04-18 ENCOUNTER — Ambulatory Visit (AMBULATORY_SURGERY_CENTER): Payer: BC Managed Care – PPO | Admitting: Gastroenterology

## 2022-04-18 ENCOUNTER — Encounter: Payer: Self-pay | Admitting: Gastroenterology

## 2022-04-18 VITALS — BP 132/77 | HR 71 | Temp 97.3°F | Resp 14

## 2022-04-18 DIAGNOSIS — D120 Benign neoplasm of cecum: Secondary | ICD-10-CM | POA: Diagnosis not present

## 2022-04-18 DIAGNOSIS — K920 Hematemesis: Secondary | ICD-10-CM | POA: Diagnosis not present

## 2022-04-18 DIAGNOSIS — K529 Noninfective gastroenteritis and colitis, unspecified: Secondary | ICD-10-CM

## 2022-04-18 DIAGNOSIS — K200 Eosinophilic esophagitis: Secondary | ICD-10-CM

## 2022-04-18 DIAGNOSIS — D123 Benign neoplasm of transverse colon: Secondary | ICD-10-CM | POA: Diagnosis not present

## 2022-04-18 DIAGNOSIS — K219 Gastro-esophageal reflux disease without esophagitis: Secondary | ICD-10-CM | POA: Diagnosis not present

## 2022-04-18 DIAGNOSIS — K222 Esophageal obstruction: Secondary | ICD-10-CM | POA: Diagnosis not present

## 2022-04-18 DIAGNOSIS — K640 First degree hemorrhoids: Secondary | ICD-10-CM

## 2022-04-18 DIAGNOSIS — R1310 Dysphagia, unspecified: Secondary | ICD-10-CM | POA: Diagnosis not present

## 2022-04-18 MED ORDER — SODIUM CHLORIDE 0.9 % IV SOLN: 500.0000 mL | Freq: Once | INTRAVENOUS | Status: DC

## 2022-04-18 MED ORDER — OMEPRAZOLE 40 MG PO CPDR: 40.0000 mg | DELAYED_RELEASE_CAPSULE | Freq: Every day | ORAL | 3 refills | Status: DC

## 2022-04-18 NOTE — Progress Notes (Signed)
Called to room to assist during endoscopic procedure.  Patient ID and intended procedure confirmed with present staff. Received instructions for my participation in the procedure from the performing physician.  

## 2022-04-18 NOTE — Patient Instructions (Signed)
Please read handouts provided. ?Continue present medications. ?Await pathology results. ?Resume previous diet. ?Use Prilosec ( omeprazole ) 40 mg daily. ? ? ?YOU HAD AN ENDOSCOPIC PROCEDURE TODAY AT [facility]:   Refer to the procedure report that was given to you for any specific questions about what was found during the examination.  If the procedure report does not answer your questions, please call your gastroenterologist to clarify.  If you requested that your care partner not be given the details of your procedure findings, then the procedure report has been included in a sealed envelope for you to review at your convenience later. ? ?YOU SHOULD EXPECT: Some feelings of bloating in the abdomen. Passage of more gas than usual.  Walking can help get rid of the air that was put into your GI tract during the procedure and reduce the bloating. If you had a lower endoscopy (such as a colonoscopy or flexible sigmoidoscopy) you may notice spotting of blood in your stool or on the toilet paper. If you underwent a bowel prep for your procedure, you may not have a normal bowel movement for a few days. ? ?Please Note:  You might notice some irritation and congestion in your nose or some drainage.  This is from the oxygen used during your procedure.  There is no need for concern and it should clear up in a day or so. ? ?SYMPTOMS TO REPORT IMMEDIATELY: ? ?Following lower endoscopy (colonoscopy or flexible sigmoidoscopy): ? Excessive amounts of blood in the stool ? Significant tenderness or worsening of abdominal pains ? Swelling of the abdomen that is new, acute ? Fever of 100?F or higher ? ?Following upper endoscopy (EGD) ? Vomiting of blood or coffee ground material ? New chest pain or pain under the shoulder blades ? Painful or persistently difficult swallowing ? New shortness of breath ? Fever of 100?F or higher ? Black, tarry-looking stools ? ?For urgent or emergent issues, a gastroenterologist can be  reached at any hour by calling [phone]. ?Do not use MyChart messaging for urgent concerns.  ? ? ?DIET:  We do recommend a small meal at first, but then you may proceed to your regular diet.  Drink plenty of fluids but you should avoid alcoholic beverages for 24 hours. ? ?ACTIVITY:  You should plan to take it easy for the rest of today and you should NOT DRIVE or use heavy machinery until tomorrow (because of the sedation medicines used during the test).   ? ?FOLLOW UP: ?Our staff will call the number listed on your records 48-72 hours following your procedure to check on you and address any questions or concerns that you may have regarding the information given to you following your procedure. If we do not reach you, we will leave a message.  We will attempt to reach you two times.  During this call, we will ask if you have developed any symptoms of COVID 19. If you develop any symptoms (ie: fever, flu-like symptoms, shortness of breath, cough etc.) before then, please call [phone].  If you test positive for Covid 19 in the 2 weeks post procedure, please call and report this information to [place].   ? ?If any biopsies were taken you will be contacted by phone or by letter within the next 1-3 weeks.  Please call us at [phone] if you have not heard about the biopsies in 3 weeks.  ? ? ?SIGNATURES/CONFIDENTIALITY: ?You and/or your care partner have signed paperwork which  will be entered into your electronic medical record.  These signatures attest to the fact that that the information above on your After Visit Summary has been reviewed and is understood.  Full responsibility of the confidentiality of this discharge information lies with you and/or your care-partner.  ?

## 2022-04-18 NOTE — Op Note (Signed)
[place] ?Patient Name: Cathy [name] ?Procedure Date: 04/18/2022 2:38 PM ?MRN: [id number] ?Endoscopist: Ellarie Picking E. Candis Schatz , MD ?Age: 56 ?Referring MD:  ?Date of Birth: 1966-05-19 ?Gender: Female ?Account #: [id number] ?Procedure:                Upper GI endoscopy ?Indications:              Dysphagia, Suspected gastro-esophageal reflux  ?                          disease ?Medicines:                Monitored Anesthesia Care ?Procedure:                Pre-Anesthesia Assessment: ?                          - Prior to the procedure, a History and Physical  ?                          was performed, and patient medications and  ?                          allergies were reviewed. The patient's tolerance of  ?                          previous anesthesia was also reviewed. The risks  ?                          and benefits of the procedure and the sedation  ?                          options and risks were discussed with the patient.  ?                          All questions were answered, and informed consent  ?                          was obtained. Prior Anticoagulants: The patient has  ?                          taken no previous anticoagulant or antiplatelet  ?                          agents. ASA Grade Assessment: II - A patient with  ?                          mild systemic disease. After reviewing the risks  ?                          and benefits, the patient was deemed in  ?                          satisfactory condition to undergo the procedure. ?  After obtaining informed consent, the endoscope was  ?                          passed under direct vision. Throughout the  ?                          procedure, the patient's blood pressure, pulse, and  ?                          oxygen saturations were monitored continuously. The  ?                          Endoscope was introduced through the mouth, and  ?                          advanced to the third part of duodenum. The upper   ?                          GI endoscopy was accomplished without difficulty.  ?                          The patient tolerated the procedure, but did  ?                          experience transient oxygenation prompting  ?                          withdrawal of the endoscope and jaw thrust,  ?                          resulting in immediate improvement in her oxygen  ?                          saturation. She also required high doses of  ?                          proprofol to maintain adequate sedation. ?Scope In: ?Scope Out: ?Findings:                 The examined portions of the nasopharynx,  ?                          oropharynx and larynx were normal. ?                          One benign-appearing, intrinsic mild stenosis was  ?                          found 30 cm from the incisors (distal esophagus).  ?                          This stenosis measured 1.2 cm (inner diameter) x 1  ?                          cm (  in length). The stenosis was traversed. A TTS  ?                          dilator was passed through the scope. Dilation with  ?                          a 16-17-18 mm balloon dilator was performed to 18  ?                          mm (initially a 11-13 mm balloon was used, but no  ?                          resistence was created at 13 mm). The dilation site  ?                          was examined and showed moderate mucosal  ?                          disruption. Estimated blood loss was minimal. ?                          LA Grade B (one or more mucosal breaks greater than  ?                          5 mm, not extending between the tops of two mucosal  ?                          folds) esophagitis with no bleeding was found. ?                          Diffuse moderate mucosal changes characterized by  ?                          longitudinal markings were found in the middle  ?                          third of the esophagus and in the lower third of  ?                          the esophagus.  Biopsies were obtained from the  ?                          proximal and distal esophagus with cold forceps for  ?                          histology of suspected eosinophilic esophagitis.  ?                          Estimated blood loss was minimal. ?                          A 4 cm hiatal hernia was present. ?  The entire examined stomach was normal. ?                          The examined duodenum was normal. ?Complications:            No immediate complications. ?Estimated Blood Loss:     Estimated blood loss was minimal. ?Impression:               - The examined portions of the nasopharynx,  ?                          oropharynx and larynx were normal. ?                          - Benign-appearing esophageal stenosis. Dilated. ?                          - LA Grade B reflux esophagitis with no bleeding. ?                          - Longitudinally marked mucosa in the esophagus.  ?                          Biopsied to exclude eosinophilic esophagitis ?                          - 4 cm hiatal hernia. ?                          - Normal stomach. ?                          - Normal examined duodenum. ?Recommendation:           - Patient has a contact number available for  ?                          emergencies. The signs and symptoms of potential  ?                          delayed complications were discussed with the  ?                          patient. Return to normal activities tomorrow.  ?                          Written discharge instructions were provided to the  ?                          patient. ?                          - Resume previous diet. ?                          - Continue present medications. ?                          -  Await pathology results. ?                          - Use Prilosec (omeprazole) 40 mg PO daily. ?Maryssa Giampietro E. Candis Schatz, MD ?04/18/2022 3:43:32 PM ?This report has been signed electronically. ?

## 2022-04-18 NOTE — Progress Notes (Signed)
Pt in recovery with monitors in place, VSS. Report given to receiving RN. Bite guard was placed with pt awake to ensure comfort. No dental or soft tissue damage noted. 

## 2022-04-18 NOTE — Progress Notes (Signed)
History and Physical Interval Note: ? ?04/18/2022 ?2:38 PM ? ?Cathy [name]  has presented today for endoscopic procedure(s), with the diagnosis of  ?Encounter Diagnoses  ?Name Primary?  ? Ileitis Yes  ? Dysphagia, unspecified type   ?Marland Kitchen  The various methods of evaluation and treatment have been discussed with the patient and/or family. After consideration of risks, benefits and other options for treatment, the patient has consented to  the endoscopic procedure(s). ? ? The patient's history has been reviewed, patient examined, no change in status, stable for endoscopic procedure(s).  I have reviewed the patient's chart and labs.  Questions were answered to the patient's satisfaction.   ? ? ?Gene Colee E. Candis Schatz, MD ?[facility] ? ?

## 2022-04-18 NOTE — Op Note (Signed)
[name] ?Procedure Date: 04/18/2022 2:28 PM ?MRN: [id number] ?Endoscopist: Kamarri Lovvorn E. Candis Schatz , MD ?Age: 56 ?Referring MD:  ?Date of Birth: 31-Oct-1966 ?Gender: Female ?Account #: [id number] ?Procedure:                Colonoscopy ?Indications:              Hematochezia, Abnormal CT of the GI tract ?Medicines:                Monitored Anesthesia Care ?Procedure:                Pre-Anesthesia Assessment: ?                          - Prior to the procedure, a History and Physical  ?                          was performed, and patient medications and  ?                          allergies were reviewed. The patient's tolerance of  ?                          previous anesthesia was also reviewed. The risks  ?                          and benefits of the procedure and the sedation  ?                          options and risks were discussed with the patient.  ?                          All questions were answered, and informed consent  ?                          was obtained. Prior Anticoagulants: The patient has  ?                          taken no previous anticoagulant or antiplatelet  ?                          agents. ASA Grade Assessment: II - A patient with  ?                          mild systemic disease. After reviewing the risks  ?                          and benefits, the patient was deemed in  ?                          satisfactory condition to undergo the procedure. ?                          After obtaining informed consent, the colonoscope  ?  was passed under direct vision. Throughout the  ?                          procedure, the patient's blood pressure, pulse, and  ?                          oxygen saturations were monitored continuously. The  ?                          PCF-HQ190L Colonoscope was introduced through the  ?                          anus and advanced to the the terminal ileum, with  ?                          identification  of the appendiceal orifice and IC  ?                          valve. The colonoscopy was performed without  ?                          difficulty. The patient tolerated the procedure  ?                          well. The quality of the bowel preparation was  ?                          good. The terminal ileum, ileocecal valve,  ?                          appendiceal orifice, and rectum were photographed.  ?                          The bowel preparation used was SUPREP via split  ?                          dose instruction. ?Scope In: 3:16:23 PM ?Scope Out: 3:30:32 PM ?Scope Withdrawal Time: 0 hours 9 minutes 36 seconds  ?Total Procedure Duration: 0 hours 14 minutes 9 seconds  ?Findings:                 Hemorrhoids were found on perianal exam. ?                          The digital rectal exam was normal. Pertinent  ?                          negatives include normal sphincter tone and no  ?                          palpable rectal lesions. ?                          A 3 mm polyp was found in the cecum. The polyp was  ?  sessile. The polyp was removed with a cold snare.  ?                          Resection and retrieval were complete. Estimated  ?                          blood loss was minimal. ?                          A 10 mm polyp was found in the distal transverse  ?                          colon. The polyp was flat. The polyp was removed  ?                          with a cold snare. Resection and retrieval were  ?                          complete. Estimated blood loss was minimal. ?                          The exam was otherwise normal throughout the  ?                          examined colon. ?                          The terminal ileum appeared normal (intubated 10  ?                          cm). ?                          Non-bleeding internal hemorrhoids were found during  ?                          retroflexion. The hemorrhoids were Grade I  ?                           (internal hemorrhoids that do not prolapse). ?                          No additional abnormalities were found on  ?                          retroflexion. ?Complications:            No immediate complications. ?Estimated Blood Loss:     Estimated blood loss was minimal. ?Impression:               - Hemorrhoids found on perianal exam. ?                          - One 3 mm polyp in the cecum, removed with a cold  ?  snare. Resected and retrieved. ?                          - One 10 mm polyp in the distal transverse colon,  ?                          removed with a cold snare. Resected and retrieved. ?                          - The examined portion of the ileum was normal. ?                          - Non-bleeding internal hemorrhoids. This is the  ?                          source of the patient's hematochezia. ?                          - The ileitis seen on CT was likely secondary to  ?                          infectious enteritis. No evidence of Crohn's  ?                          disease or any other abnormality in the terminal  ?                          ileum ?Recommendation:           - Patient has a contact number available for  ?                          emergencies. The signs and symptoms of potential  ?                          delayed complications were discussed with the  ?                          patient. Return to normal activities tomorrow.  ?                          Written discharge instructions were provided to the  ?                          patient. ?                          - Resume previous diet. ?                          - Continue present medications. ?                          - Await pathology results. ?                          -  Repeat colonoscopy (date not yet determined) for  ?                          surveillance based on pathology results. ?                          - Recommend daily fiber supplementation to reduce  ?                           hemorrhoidal bleeding and improve constipation ?Santrice Muzio E. Candis Schatz, MD ?04/18/2022 3:48:37 PM ?This report has been signed electronically. ?

## 2022-04-20 ENCOUNTER — Telehealth: Payer: Self-pay | Admitting: *Deleted

## 2022-04-20 NOTE — Telephone Encounter (Signed)
?  Follow up Call- ? ? ?  04/18/2022  ?  1:36 PM  ?Call back number  ?Post procedure Call Back phone  # [phone]  ?Permission to leave phone message Yes  ?  ? ?Patient questions: ? ?Do you have a fever, pain , or abdominal swelling? No. ?Pain Score  0 * ? ?Have you tolerated food without any problems? Yes.   ? ?Have you been able to return to your normal activities? Yes.   ? ?Do you have any questions about your discharge instructions: ?Diet   No. ?Medications  No. ?Follow up visit  No. ? ?Do you have questions or concerns about your Care? No. ? ?Actions: ?* If pain score is 4 or above: ?No action needed, pain <4. ? ? ?

## 2022-05-01 ENCOUNTER — Ambulatory Visit: Payer: BC Managed Care – PPO | Admitting: Orthopaedic Surgery

## 2022-05-01 ENCOUNTER — Encounter: Payer: Self-pay | Admitting: Orthopaedic Surgery

## 2022-05-01 DIAGNOSIS — M79672 Pain in left foot: Secondary | ICD-10-CM | POA: Diagnosis not present

## 2022-05-01 DIAGNOSIS — M79671 Pain in right foot: Secondary | ICD-10-CM | POA: Diagnosis not present

## 2022-05-01 MED ORDER — MELOXICAM 15 MG PO TABS: 15.0000 mg | ORAL_TABLET | Freq: Every day | ORAL | 1 refills | Status: DC

## 2022-05-01 MED ORDER — PREDNISONE 50 MG PO TABS: ORAL_TABLET | ORAL | 0 refills | Status: DC

## 2022-05-01 NOTE — Progress Notes (Signed)
The patient is a very pleasant 56 year old female who comes in with slowly worsening pain in both her feet and ankles that has been going on for about 2 years now.  She does wear very light shoes and I think they are good supportive for her.  She denies any specific injuries but had an old injury with a tendon repair years ago.  She hurts around the lateral aspect of her hindfoot but also in the Achilles tendon area in the plantar area on both feet.  She says her first up in the morning is certainly painful.  She is not taking anti-inflammatories. ? ?On exam she does appear to have normal contours of her foot and ankle.  She has a lot of pain over the plantar fascia on both sides of the heel as well as the Achilles tendon on both sides. ? ?We talked in length in detail about shoewear and going to a place like [employer] for more supportive shoes.  I will have her work on stretching the Achilles tendon and gave her Thera-Band for this.  I would also like to try 5 days of 50 mg prednisone and meloxicam once daily.  I will see her back in 4 weeks noted by then she will be feeling better.  My next step would be at least considering physical therapy and potential injections in the plantar fascia area.  Hopefully shoewear over the long-term will help for the most.  All question concerns were answered and addressed.  We will see her back in 4 weeks with no x-rays are needed. ?

## 2022-05-02 NOTE — Progress Notes (Signed)
Cathy [name], ?Biopsies of your esophagus showed inflammatory changes, with elevated levels of eosinophils (a type of white blood cell).  These findings can be seen in reflux esophagitis, or can be seen in a condition called eosinophilic esophagitis. ?Given the findings of reflux esophagitis and her hiatal hernia, I think that your eosinophils are most likely related to reflux esophagitis.  Eosinophilic esophagitis typically presents in young adulthood. ?I recommend you continue taking your omeprazole every day, and we repeat an upper endoscopy in 8 weeks and repeat biopsies of the esophagus.  If the eosinophils have resolved, this is more suggestive of reflux, not eosinophilic esophagitis. ? ?The polyps that I removed during your recent procedure were completely benign but were proven to be "pre-cancerous" polyps that MAY have grown into cancers if they had not been removed.  Studies shows that at least 20% of women over age 36 and 30% of men over age 75 have pre-cancerous polyps.  Based on current nationally recognized surveillance guidelines, I recommend that you have a repeat colonoscopy in 3 years.  ? ?Our office will contact you to set up the repeat upper endoscopy. ? ?If you develop any new rectal bleeding, abdominal pain or significant bowel habit changes, please contact me before then. ? ?Vaughan Basta, can you please get [name] set up for a repeat EGD in 6 to 8 weeks? ? ? ?

## 2022-05-07 ENCOUNTER — Other Ambulatory Visit: Payer: Self-pay

## 2022-05-07 DIAGNOSIS — R1310 Dysphagia, unspecified: Secondary | ICD-10-CM

## 2022-05-08 DIAGNOSIS — Z01419 Encounter for gynecological examination (general) (routine) without abnormal findings: Secondary | ICD-10-CM | POA: Diagnosis not present

## 2022-05-08 DIAGNOSIS — N951 Menopausal and female climacteric states: Secondary | ICD-10-CM | POA: Diagnosis not present

## 2022-05-08 DIAGNOSIS — Z1389 Encounter for screening for other disorder: Secondary | ICD-10-CM | POA: Diagnosis not present

## 2022-05-08 DIAGNOSIS — Z1231 Encounter for screening mammogram for malignant neoplasm of breast: Secondary | ICD-10-CM | POA: Diagnosis not present

## 2022-05-08 LAB — HM MAMMOGRAPHY

## 2022-05-24 DIAGNOSIS — M7750 Other enthesopathy of unspecified foot: Secondary | ICD-10-CM

## 2022-05-24 HISTORY — DX: Other enthesopathy of unspecified foot and ankle: M77.50

## 2022-05-30 ENCOUNTER — Encounter: Payer: Self-pay | Admitting: Orthopaedic Surgery

## 2022-05-30 ENCOUNTER — Ambulatory Visit: Payer: BC Managed Care – PPO | Admitting: Orthopaedic Surgery

## 2022-05-30 DIAGNOSIS — M79672 Pain in left foot: Secondary | ICD-10-CM | POA: Diagnosis not present

## 2022-05-30 DIAGNOSIS — M79671 Pain in right foot: Secondary | ICD-10-CM

## 2022-05-30 NOTE — Progress Notes (Signed)
The patient comes in today with for follow-up as a relates to bilateral foot pain.  She was not able to get to a shoe store for getting more firm and supportive shoes.  She is still planning to do that.  She said the pain comes and goes when she first came to see me it was certainly worse.  She has a remote history of a peroneal tendon tear which is more of a longitudinal tear on the right ankle that we actually repaired back in 2011.  She is tender on that lateral aspect of her foot.  There is no swelling and redness on the lateral aspect of her right foot and range of motion around the ankle is normal.  She does not describe any symptoms of instability.  There is some pain to palpation along the course of the peroneal tendons but again no loss of strength.  The tendons do not subluxate.  I am going to have her continue occasional Voltaren gel and meloxicam.  I will continue to recommend more firm supportive shoe wear.  If things worsen she can come back and see [place] and she understands as well since she is feeling better.  All questions and concerns were answered and addressed.  She has been performing stretching as well which is helped.

## 2022-06-29 ENCOUNTER — Ambulatory Visit (AMBULATORY_SURGERY_CENTER): Payer: BC Managed Care – PPO | Admitting: Gastroenterology

## 2022-06-29 ENCOUNTER — Telehealth: Payer: Self-pay

## 2022-06-29 ENCOUNTER — Encounter: Payer: Self-pay | Admitting: Gastroenterology

## 2022-06-29 VITALS — BP 121/66 | HR 61 | Temp 98.4°F | Resp 11 | Ht 61.0 in | Wt 186.0 lb

## 2022-06-29 DIAGNOSIS — R1310 Dysphagia, unspecified: Secondary | ICD-10-CM | POA: Diagnosis not present

## 2022-06-29 DIAGNOSIS — K219 Gastro-esophageal reflux disease without esophagitis: Secondary | ICD-10-CM

## 2022-06-29 DIAGNOSIS — K229 Disease of esophagus, unspecified: Secondary | ICD-10-CM | POA: Diagnosis not present

## 2022-06-29 HISTORY — DX: Dysphagia, unspecified: R13.10

## 2022-06-29 MED ORDER — SODIUM CHLORIDE 0.9 % IV SOLN: 500.0000 mL | Freq: Once | INTRAVENOUS | Status: DC

## 2022-06-29 MED ORDER — DICYCLOMINE HCL 10 MG PO CAPS: 20.0000 mg | ORAL_CAPSULE | Freq: Four times a day (QID) | ORAL | 1 refills | Status: DC | PRN

## 2022-06-29 NOTE — Op Note (Signed)
[facility] Patient Name: Cathy [name] Procedure Date: 06/29/2022 9:51 AM MRN: [id number] Endoscopist: Nicki Reaper E. Candis [name] , MD Age: 56 Referring MD:  Date of Birth: 04/09/1966 Gender: Female Account #: [id number] Procedure:                Upper GI endoscopy Indications:              Follow-up of esophageal reflux, Exclusion of                            eosinophilic esophagitis Medicines:                Monitored Anesthesia Care Procedure:                Pre-Anesthesia Assessment:                           - Prior to the procedure, a History and Physical                            was performed, and patient medications and                            allergies were reviewed. The patient's tolerance of                            previous anesthesia was also reviewed. The risks                            and benefits of the procedure and the sedation                            options and risks were discussed with the patient.                            All questions were answered, and informed consent                            was obtained. Prior Anticoagulants: The patient has                            taken no previous anticoagulant or antiplatelet                            agents. ASA Grade Assessment: II - A patient with                            mild systemic disease. After reviewing the risks                            and benefits, the patient was deemed in                            satisfactory condition to undergo the procedure.  After obtaining informed consent, the endoscope was                            passed under direct vision. Throughout the                            procedure, the patient's blood pressure, pulse, and                            oxygen saturations were monitored continuously. The                            Endoscope was introduced through the mouth, and                            advanced to the third part of  duodenum. The upper                            GI endoscopy was accomplished without difficulty.                            The patient tolerated the procedure well. Scope In: Scope Out: Findings:                 The examined esophagus was normal. Biopsies were                            obtained from the proximal and distal esophagus                            with cold forceps for histology of suspected                            eosinophilic esophagitis. Estimated blood loss was                            minimal.                           The gastroesophageal flap valve was visualized                            endoscopically and classified as Hill Grade III                            (minimal fold, loose to endoscope, hiatal hernia                            likely).                           The entire examined stomach was normal.                           The examined duodenum was normal. Complications:  No immediate complications. Estimated Blood Loss:     Estimated blood loss was minimal. Impression:               - Normal esophagus. Biopsied.                           - Gastroesophageal flap valve classified as Hill                            Grade III (minimal fold, loose to endoscope, hiatal                            hernia likely).                           - Normal stomach.                           - Normal examined duodenum.                           - The previously noted reflux esophagitis has                            healed. No esophageal stenosis was seen. Recommendation:           - Patient has a contact number available for                            emergencies. The signs and symptoms of potential                            delayed complications were discussed with the                            patient. Return to normal activities tomorrow.                            Written discharge instructions were provided to the                             patient.                           - Resume previous diet.                           - Continue present medications.                           - Await pathology results                           - Patient's esophageal eosinophilia was most likely                            secondary to acid reflux, not eosinophilic  esophagitis.                           - If desired, pH/impedance testing and esophageal                            manometry can be performed to further evaluate                            patient's dysphagia, cough and voice changes. Cathy Cranor E. Candis Schatz, MD 06/29/2022 10:12:07 AM This report has been signed electronically.

## 2022-06-29 NOTE — Progress Notes (Signed)
Called to room to assist during endoscopic procedure.  Patient ID and intended procedure confirmed with present staff. Received instructions for my participation in the procedure from the performing physician.  

## 2022-06-29 NOTE — Telephone Encounter (Signed)
-----   Message from Daryel November, MD sent at 06/29/2022  1:21 PM EDT ----- Vaughan Basta,  Can you please schedule a follow up office visit for Ms. Stohr? Thanks

## 2022-06-29 NOTE — Telephone Encounter (Signed)
Pt scheduled to see Dr. Candis Schatz 08/06/22 at 3:20pm. Appt letter mailed to pt.

## 2022-06-29 NOTE — Progress Notes (Signed)
Sedate, gd SR, tolerated procedure well, VSS, report to RN 

## 2022-06-29 NOTE — Progress Notes (Signed)
Pt's states no medical or surgical changes since previsit or office visit. 

## 2022-06-29 NOTE — Progress Notes (Signed)
[facility] History and Physical   Primary Care Physician:  Ma Hillock, DO   Reason for Procedure:   Follow up esophagitis, dysphagia  Plan:    EGD with esophageal biopsies, possible dilation     HPI: Cathy [name] is a 56 y.o. female undergoing repeat EGD after an EGD in April was notable for reflux esophagitis, a distal esophageal stricture (dilated to 18 mm) and elevated esophageal eosinophils.  She was started on omeprazole, but has not noted any change in her dysphagia.  In addition, she has had persistent voice changes and a morning cough.  Denies frequent heartburn or acid regurgitation.    Past Medical History:  Diagnosis Date   Anemia    Depression    Dyspnea on exertion    With atypical chest pain, followed by cardiology.   Genital warts 35/0093   TCA application by GYn   History of frequent urinary tract infections    Juvenile epilepsy ([place])    Migraines    Seizures ([place])    none since 1987- classified as epileptic seizures   Tendonitis of ankle or foot 05/2022   right   Thrombosed external hemorrhoid     Past Surgical History:  Procedure Laterality Date   BILATERAL SALPINGECTOMY Bilateral 12/20/2014   Procedure: BILATERAL SALPINGECTOMY;  Surgeon: Logan Bores, MD;  Location: [facility];  Service: Gynecology;  Laterality: Bilateral;   COLONOSCOPY  03/2022   endocolon   HEMORRHOID SURGERY     thrombosed hemorrhoid   LAPAROSCOPIC ASSISTED VAGINAL HYSTERECTOMY N/A 12/20/2014   Procedure: LAPAROSCOPIC ASSISTED VAGINAL HYSTERECTOMY;  Surgeon: Logan Bores, MD;  Location: [facility];  Service: Gynecology;  Laterality: N/A;   tendon repair Right 12/24/2009   right ankle   TONSILLECTOMY  12/25/1971   UPPER GASTROINTESTINAL ENDOSCOPY      Prior to Admission medications   Medication Sig Start Date End Date Taking? Authorizing Provider  omeprazole (PRILOSEC) 40 MG capsule Take 1 capsule (40 mg total) by mouth daily. 04/18/22  Yes Daryel November, MD  venlafaxine XR (EFFEXOR-XR) 75 MG 24 hr capsule Take 1 capsule (75 mg total) by mouth daily with breakfast. 12/14/21  Yes Kuneff, Renee A, DO  cyanocobalamin (,VITAMIN B-12,) 1000 MCG/ML injection Inject 1 mL (1,000 mcg total) into the muscle every 14 (fourteen) days. 12/14/21   Kuneff, Renee A, DO  dicyclomine (BENTYL) 10 MG capsule Take 1 capsule (10 mg total) by mouth 3 (three) times daily as needed for spasms (Abdominal pain). Patient not taking: Reported on 04/18/2022 03/20/22   Noralyn Pick, NP  HYDROcodone-acetaminophen (NORCO/VICODIN) 5-325 MG tablet Take 1 tablet by mouth every 6 (six) hours as needed for severe pain. 03/04/22   Blanchie Dessert, MD  hydrocortisone (ANUSOL-HC) 25 MG suppository Place 1 suppository (25 mg total) rectally 2 (two) times daily. 03/12/22   Kuneff, Renee A, DO  meloxicam (MOBIC) 15 MG tablet Take 1 tablet (15 mg total) by mouth daily. 05/01/22   Mcarthur Rossetti, MD  ondansetron (ZOFRAN) 4 MG tablet Take 1 tablet (4 mg total) by mouth every 8 (eight) hours as needed for nausea or vomiting. 03/12/22   Kuneff, Renee A, DO  SUMAtriptan (IMITREX) 50 MG tablet Take 1 tablet (50 mg total) by mouth every 2 (two) hours as needed for migraine. May repeat in 2 hours if headache persists or recurs. 12/14/21   Kuneff, Renee A, DO  Syringe/Needle, Disp, (SYRINGE 3CC/21GX1") 21G X 1" 3 ML MISC Inject 1 mL into  the muscle every 14 (fourteen) days. Pt to inject 39m every 14 days intramuscular DX  E53.8 12/14/21   Kuneff, Renee A, DO    Current Outpatient Medications  Medication Sig Dispense Refill   omeprazole (PRILOSEC) 40 MG capsule Take 1 capsule (40 mg total) by mouth daily. 90 capsule 3   venlafaxine XR (EFFEXOR-XR) 75 MG 24 hr capsule Take 1 capsule (75 mg total) by mouth daily with breakfast. 90 capsule 1   cyanocobalamin (,VITAMIN B-12,) 1000 MCG/ML injection Inject 1 mL (1,000 mcg total) into the muscle every 14 (fourteen) days. 2 mL 11    dicyclomine (BENTYL) 10 MG capsule Take 1 capsule (10 mg total) by mouth 3 (three) times daily as needed for spasms (Abdominal pain). (Patient not taking: Reported on 04/18/2022) 30 capsule 0   HYDROcodone-acetaminophen (NORCO/VICODIN) 5-325 MG tablet Take 1 tablet by mouth every 6 (six) hours as needed for severe pain. 10 tablet 0   hydrocortisone (ANUSOL-HC) 25 MG suppository Place 1 suppository (25 mg total) rectally 2 (two) times daily. 12 suppository 0   meloxicam (MOBIC) 15 MG tablet Take 1 tablet (15 mg total) by mouth daily. 30 tablet 1   ondansetron (ZOFRAN) 4 MG tablet Take 1 tablet (4 mg total) by mouth every 8 (eight) hours as needed for nausea or vomiting. 20 tablet 0   SUMAtriptan (IMITREX) 50 MG tablet Take 1 tablet (50 mg total) by mouth every 2 (two) hours as needed for migraine. May repeat in 2 hours if headache persists or recurs. 10 tablet 11   Syringe/Needle, Disp, (SYRINGE 3CC/21GX1") 21G X 1" 3 ML MISC Inject 1 mL into the muscle every 14 (fourteen) days. Pt to inject 163mevery 14 days intramuscular DX  E53.8 48 each 0   Current Facility-Administered Medications  Medication Dose Route Frequency Provider Last Rate Last Admin   0.9 %  sodium chloride infusion  500 mL Intravenous Once CuDaryel NovemberMD        Allergies as of 06/29/2022 - Review Complete 06/29/2022  Allergen Reaction Noted   Macrobid [nitrofurantoin monohyd macro] Anaphylaxis 01/18/2014   Nitrofurantoin macrocrystal Shortness Of Breath 06/29/2022    Family History  Problem Relation Age of Onset   Hyperlipidemia Mother    Heart disease Mother    Hypertension Mother    Ovarian cancer Mother    Cervical cancer Mother    Heart attack Mother    Depression Sister    Asthma Sister    Dementia Maternal Grandmother    Heart attack Maternal Grandmother    Heart attack Maternal Grandfather    Heart attack Paternal Grandmother    Asthma Son    Colon cancer Neg Hx    Pancreatic cancer Neg Hx    Liver  cancer Neg Hx    Esophageal cancer Neg Hx     Social History   Socioeconomic History   Marital status: Divorced    Spouse name: Not on file   Number of children: 3   Years of education: 16   Highest education level: Not on file  Occupational History   Occupation: [employer]  Tobacco Use   Smoking status: Never   Smokeless tobacco: Never  Vaping Use   Vaping Use: Never used  Substance and Sexual Activity   Alcohol use: Yes    Alcohol/week: 1.0 standard drink of alcohol    Types: 1 Standard drinks or equivalent per week    Comment: socially, rarely. less  than monthly   Drug use: Never   Sexual activity: Yes    Partners: Male    Birth control/protection: Post-menopausal, Surgical    Comment: partial hysterectemy  Other Topics Concern   Not on file  Social History Narrative   Divorced. 3 children. Bachelors degree. Works as an Optometrist.   Exercises routinely.   Wears her seatbelt. Wears a bicycle helmet. Smoke detector in the home.   Feels safe in her relationships.   Social Determinants of Health   Financial Resource Strain: Not on file  Food Insecurity: Not on file  Transportation Needs: Not on file  Physical Activity: Not on file  Stress: Not on file  Social Connections: Not on file  Intimate Partner Violence: Not on file    Review of Systems:  All other review of systems negative except as mentioned in the HPI.  Physical Exam: Vital signs BP 114/68   Pulse 70   Temp 98.4 F (36.9 C) (Temporal)   Ht '5\' 1"'$  (1.549 m)   Wt 186 lb (84.4 kg)   LMP 11/19/2014   SpO2 96%   BMI 35.14 kg/m   General:   Alert,  Well-developed, well-nourished, pleasant and cooperative in NAD Airway:  Mallampati 1 Lungs:  Clear throughout to auscultation.   Heart:  Regular rate and rhythm; no murmurs, clicks, rubs,  or gallops. Abdomen:  Soft, nontender and nondistended. Normal bowel sounds.   Neuro/Psych:  Normal mood and  affect. A and O x 3   Madeleine Fenn E. Candis Schatz, MD [facility]

## 2022-06-29 NOTE — Patient Instructions (Signed)
YOU HAD AN ENDOSCOPIC PROCEDURE TODAY AT THE [facility]:   Refer to the procedure report that was given to you for any specific questions about what was found during the examination.  If the procedure report does not answer your questions, please call your gastroenterologist to clarify.  If you requested that your care partner not be given the details of your procedure findings, then the procedure report has been included in a sealed envelope for you to review at your convenience later.  YOU SHOULD EXPECT: Some feelings of bloating in the abdomen. Passage of more gas than usual.  Walking can help get rid of the air that was put into your GI tract during the procedure and reduce the bloating. If you had a lower endoscopy (such as a colonoscopy or flexible sigmoidoscopy) you may notice spotting of blood in your stool or on the toilet paper. If you underwent a bowel prep for your procedure, you may not have a normal bowel movement for a few days.  Please Note:  You might notice some irritation and congestion in your nose or some drainage.  This is from the oxygen used during your procedure.  There is no need for concern and it should clear up in a day or so.  SYMPTOMS TO REPORT IMMEDIATELY:  Following lower endoscopy (colonoscopy or flexible sigmoidoscopy):  Excessive amounts of blood in the stool  Significant tenderness or worsening of abdominal pains  Swelling of the abdomen that is new, acute  Fever of 100F or higher  Following upper endoscopy (EGD)  Vomiting of blood or coffee ground material  New chest pain or pain under the shoulder blades  Painful or persistently difficult swallowing  New shortness of breath  Fever of 100F or higher  Black, tarry-looking stools  For urgent or emergent issues, a gastroenterologist can be reached at any hour by calling [phone]. Do not use MyChart messaging for urgent concerns.    DIET:  We do recommend a small meal at first, but  then you may proceed to your regular diet.  Drink plenty of fluids but you should avoid alcoholic beverages for 24 hours.  ACTIVITY:  You should plan to take it easy for the rest of today and you should NOT DRIVE or use heavy machinery until tomorrow (because of the sedation medicines used during the test).    FOLLOW UP: Our staff will call the number listed on your records the next business day following your procedure.  We will call around 7:15- 8:00 am to check on you and address any questions or concerns that you may have regarding the information given to you following your procedure. If we do not reach you, we will leave a message.  If you develop any symptoms (ie: fever, flu-like symptoms, shortness of breath, cough etc.) before then, please call [phone].  If you test positive for Covid 19 in the 2 weeks post procedure, please call and report this information to us.    If any biopsies were taken you will be contacted by phone or by letter within the next 1-3 weeks.  Please call us at [phone] if you have not heard about the biopsies in 3 weeks.    SIGNATURES/CONFIDENTIALITY: You and/or your care partner have signed paperwork which will be entered into your electronic medical record.  These signatures attest to the fact that that the information above on your After Visit Summary has been reviewed and is understood.  Full responsibility of the confidentiality   of this discharge information lies with you and/or your care-partner.  

## 2022-07-02 ENCOUNTER — Telehealth: Payer: Self-pay

## 2022-07-02 NOTE — Telephone Encounter (Signed)
  Follow up Call-     06/29/2022    9:25 AM 04/18/2022    1:36 PM  Call back number  Post procedure Call Back phone  # [phone]  Permission to leave phone message Yes Yes     Patient questions:  Do you have a fever, pain , or abdominal swelling? No. Pain Score  0 *  Have you tolerated food without any problems? Yes.    Have you been able to return to your normal activities? Yes.    Do you have any questions about your discharge instructions: Diet   No. Medications  No. Follow up visit  No.  Do you have questions or concerns about your Care? No.  Actions: * If pain score is 4 or above: No action needed, pain <4.

## 2022-07-02 NOTE — Telephone Encounter (Incomplete)
  Follow up Call-     06/29/2022    9:25 AM 04/18/2022    1:36 PM  Call back number  Post procedure Call Back phone  # [phone]  Permission to leave phone message Yes Yes     Patient questions:  Do you have a fever, pain , or abdominal swelling? No. Pain Score  0 *  Have you tolerated food without any problems? Yes.    Have you been able to return to your normal activities? Yes.    Do you have any questions about your discharge instructions: Diet   No. Medications  No. Follow up visit  No.  Do you have questions or concerns about your Care? Yes.  Pt. Reported she had a very sore throat, all the way down to her epigastric area, and that even today when she swallows it burns.  Pt. States the soreness/discomfort is improving.  Pt. Is able to eat, swallow, and return to her normal activities.  I told pt. To please call if she has further questions or concerns, or if her symptoms do not continue to improve.  Pt. Plans to call and schedule an office visit for  follow-up consultation.  Actions: * If pain score is 4 or above: {ACTION; [facility] ENDO PAIN >4:[id number]}

## 2022-07-05 NOTE — Progress Notes (Signed)
Cathy [name],  The biopsies of your esophagus showed "reactive mucosa", which is likely a response to the presence of stomach acid in the esophagus.  There were no eosinophils to suggest eosinophilic esophagitis.  It is possible that many of your symptoms are related to gastroesophageal reflux disease (GERD).  Please follow-up with me in clinic as scheduled to discuss further evaluation and management of your chronic GI symptoms

## 2022-07-17 ENCOUNTER — Ambulatory Visit: Payer: BC Managed Care – PPO | Admitting: Family Medicine

## 2022-07-17 ENCOUNTER — Encounter: Payer: Self-pay | Admitting: Family Medicine

## 2022-07-17 VITALS — BP 117/73 | HR 63 | Temp 98.3°F | Ht 61.0 in | Wt 189.0 lb

## 2022-07-17 DIAGNOSIS — G44229 Chronic tension-type headache, not intractable: Secondary | ICD-10-CM | POA: Diagnosis not present

## 2022-07-17 DIAGNOSIS — F339 Major depressive disorder, recurrent, unspecified: Secondary | ICD-10-CM | POA: Diagnosis not present

## 2022-07-17 DIAGNOSIS — G4452 New daily persistent headache (NDPH): Secondary | ICD-10-CM

## 2022-07-17 MED ORDER — SUMATRIPTAN SUCCINATE 50 MG PO TABS: 50.0000 mg | ORAL_TABLET | ORAL | 11 refills | Status: DC | PRN

## 2022-07-17 MED ORDER — VENLAFAXINE HCL ER 150 MG PO CP24: 150.0000 mg | ORAL_CAPSULE | Freq: Every day | ORAL | 1 refills | Status: DC

## 2022-07-17 NOTE — Patient Instructions (Addendum)
Return in about 7 months (around 02/11/2023) for cpe (20 min), Routine chronic condition follow-up. Fasting labs, preventative screenings and chronic conditions will be covered during this appointment       Great to see you today.  I have refilled the medication(s) we provide.   If labs were collected, we will inform you of lab results once received either by echart message or telephone call.   - echart message- for normal results that have been seen by the patient already.   - telephone call: abnormal results or if patient has not viewed results in their echart.

## 2022-07-17 NOTE — Progress Notes (Signed)
Patient ID: Cathy [name], female  DOB: 08-21-1966, 56 y.o.   MRN: [id number] Patient Care Team    Relationship Specialty Notifications Start End  Ma Hillock, DO PCP - General Family Medicine  10/07/17   Lorretta Harp, MD Consulting Physician Cardiology  09/27/17   Paula Compton, MD Consulting Physician Obstetrics and Gynecology  09/27/17     Chief Complaint  Patient presents with   Depression    Cmc; pt is fasting    Subjective: Cathy [name] is a 56 y.o.  Female  present for [facility] All past medical history, surgical history, allergies, family history, immunizations, medications and social history were updated in the electronic medical record today. All recent labs, ED visits and hospitalizations within the last year were reviewed.  Depression/anxiety: Patient reports she is feeling ok on the Effexor. She has a lot of stress around the health of her father and work load. She has struggled with throat lesions (benign), reflux. She feels overwhelmed. No si or hi.  Prior note: Pt presents for an OV with complaints of sinus, fatigue, headache and heartburn.  She experienced similar symptoms over the past years when her anxiety had increased.  She reports today she is not taking her B12 or Effexor any longer.  She is using the Imitrex when needed for her migraines.  She is under very stressful time in her life and has recently switched jobs.  She reports she had her eyes checked and did get new glasses, she does not feel this made a difference in her symptoms.  She endorses both vertigo-like symptoms and lightheadedness.  She does not drink much fluid in the day.  She reports she had recent labs completed at her gynecologist and was told they were normal, including her B12. Prior note: Effexor 75 mg daily had been prescribed in the past and patient had reported it was very helpful with both her depression, anxiety and headaches  Chronic headache: Patient reports compliance with B12  injections every 2 weeks.  She is using the Vistaril as needed  She has started back the Effexor for her depression and anxiety, which also helped with her headaches in the past.  She states her headaches have greatly improved.  They are still present on occasions.  She is working on herself care and has been scheduling herself routine massages which also help.     07/17/2022    9:52 AM 02/08/2022    9:21 AM 12/14/2021   10:40 AM 03/02/2019    2:19 PM 08/26/2018    2:05 PM  Depression screen PHQ 2/9  Decreased Interest _0 0  Down, Depressed, Hopeless _1 0  PHQ - 2 Score _2 0  Altered sleeping _3 Tired, decreased energy _4 Change in appetite _5 Feeling bad or failure about yourself  _6 Trouble concentrating _7 Moving slowly or fidgety/restless 1 0 1 1   Suicidal thoughts 1 0 0 0   PHQ-9 Score _8 Difficult doing work/chores    Very difficult       07/17/2022    9:56 AM 02/08/2022    9:22 AM 12/14/2021   10:40 AM 03/02/2019    2:19 PM  GAD 7 : Generalized Anxiety Score  Nervous, Anxious, on Edge 2 2  3 3  Control/stop worrying _0 Worry too much - different things _1 Trouble relaxing _2 Restless _3 Easily annoyed or irritable _4 Afraid - awful might happen _5 Total GAD 7 Score _6 Anxiety Difficulty    Very difficult    Immunization History  Administered Date(s) Administered   Influenza,inj,Quad PF,6+ Mos 08/26/2018   Influenza-Unspecified 11/15/2014   Tdap 10/12/2010, 04/12/2016    Past Medical History:  Diagnosis Date   Anemia    Colon polyp    Depression    Dyspnea on exertion    With atypical chest pain, followed by cardiology.   Genital warts 81/1031   TCA application by GYn   History of frequent urinary tract infections    Ileitis    Juvenile epilepsy ([place])    Migraines    Seizures ([place])    none since 1987- classified as epileptic seizures   Tendonitis of  ankle or foot 05/2022   right   Thrombosed external hemorrhoid    Allergies  Allergen Reactions   Macrobid [Nitrofurantoin Monohyd Macro] Anaphylaxis   Nitrofurantoin Macrocrystal Shortness Of Breath   Past Surgical History:  Procedure Laterality Date   BILATERAL SALPINGECTOMY Bilateral 12/20/2014   Procedure: BILATERAL SALPINGECTOMY;  Surgeon: Logan Bores, MD;  Location: [facility];  Service: Gynecology;  Laterality: Bilateral;   COLONOSCOPY  03/2022   endocolon   HEMORRHOID SURGERY     thrombosed hemorrhoid   LAPAROSCOPIC ASSISTED VAGINAL HYSTERECTOMY N/A 12/20/2014   Procedure: LAPAROSCOPIC ASSISTED VAGINAL HYSTERECTOMY;  Surgeon: Logan Bores, MD;  Location: [facility];  Service: Gynecology;  Laterality: N/A;   tendon repair Right 12/24/2009   right ankle   TONSILLECTOMY  12/25/1971   UPPER GASTROINTESTINAL ENDOSCOPY     Family History  Problem Relation Age of Onset   Hyperlipidemia Mother    Heart disease Mother    Hypertension Mother    Ovarian cancer Mother    Cervical cancer Mother    Heart attack Mother    Depression Sister    Asthma Sister    Dementia Maternal Grandmother    Heart attack Maternal Grandmother    Heart attack Maternal Grandfather    Heart attack Paternal Grandmother    Asthma Son    Colon cancer Neg Hx    Pancreatic cancer Neg Hx    Liver cancer Neg Hx    Esophageal cancer Neg Hx    Social History   Social History Narrative   Divorced. 3 children. Bachelors degree. Works as an Optometrist.   Exercises routinely.   Wears her seatbelt. Wears a bicycle helmet. Smoke detector in the home.   Feels safe in her relationships.    Allergies as of 07/17/2022       Reactions   Macrobid [nitrofurantoin Monohyd Macro] Anaphylaxis   Nitrofurantoin Macrocrystal Shortness Of Breath        Medication List        Accurate as of July 17, 2022 10:13 AM. If you have any questions, ask your nurse or doctor.          STOP taking these  medications    HYDROcodone-acetaminophen 5-325 MG tablet Commonly known as: NORCO/VICODIN Stopped by: Howard Pouch, DO   hydrocortisone 25 MG suppository Commonly known as: ANUSOL-HC Stopped by: Howard Pouch, DO       TAKE these medications  cyanocobalamin 1000 MCG/ML injection Commonly known as: (VITAMIN B-12) Inject 1 mL (1,000 mcg total) into the muscle every 14 (fourteen) days.   dicyclomine 10 MG capsule Commonly known as: BENTYL Take 2 capsules (20 mg total) by mouth every 6 (six) hours as needed for spasms. What changed: Another medication with the same name was removed. Continue taking this medication, and follow the directions you see here. Changed by: Howard Pouch, DO   meloxicam 15 MG tablet Commonly known as: MOBIC Take 1 tablet (15 mg total) by mouth daily.   omeprazole 40 MG capsule Commonly known as: PRILOSEC Take 1 capsule (40 mg total) by mouth daily.   ondansetron 4 MG tablet Commonly known as: ZOFRAN Take 1 tablet (4 mg total) by mouth every 8 (eight) hours as needed for nausea or vomiting.   SUMAtriptan 50 MG tablet Commonly known as: Imitrex Take 1 tablet (50 mg total) by mouth every 2 (two) hours as needed for migraine. May repeat in 2 hours if headache persists or recurs.   SYRINGE 3CC/21GX1" 21G X 1" 3 ML Misc Inject 1 mL into the muscle every 14 (fourteen) days. Pt to inject 33m every 14 days intramuscular DX  E53.8   venlafaxine XR 75 MG 24 hr capsule Commonly known as: EFFEXOR-XR Take 1 capsule (75 mg total) by mouth daily with breakfast. What changed: Another medication with the same name was added. Make sure you understand how and when to take each. Changed by: RHoward Pouch DO   venlafaxine XR 150 MG 24 hr capsule Commonly known as: Effexor XR Take 1 capsule (150 mg total) by mouth daily with breakfast. What changed: You were already taking a medication with the same name, and this prescription was added. Make sure you understand  how and when to take each. Changed by: RHoward Pouch DO        All past medical history, surgical history, allergies, family history, immunizations andmedications were updated in the EMR today and reviewed under the history and medication portions of their EMR.     No results found for this or any previous visit (from the past 2160 hour(s)).   Review of Systems  Psychiatric/Behavioral:  Negative for suicidal ideas.    14 pt review of systems performed and negative (unless mentioned in an HPI)  Objective: BP 117/73   Pulse 63   Temp 98.3 F (36.8 C) (Oral)   Ht _0  (1.549 m)   Wt 189 lb (85.7 kg)   LMP 11/19/2014   SpO2 99%   BMI 35.71 kg/m  Physical Exam Vitals and nursing note reviewed.  Constitutional:      General: She is not in acute distress.    Appearance: Normal appearance. She is obese. She is not ill-appearing or toxic-appearing.  HENT:     Head: Normocephalic and atraumatic.  Eyes:     General: No scleral icterus.       Right eye: No discharge.        Left eye: No discharge.     Extraocular Movements: Extraocular movements intact.     Conjunctiva/sclera: Conjunctivae normal.     Pupils: Pupils are equal, round, and reactive to light.  Cardiovascular:     Rate and Rhythm: Normal rate and regular rhythm.     Pulses: Normal pulses.     Heart sounds: Normal heart sounds. No murmur heard. Pulmonary:     Effort: Pulmonary effort is normal. No respiratory distress.     Breath sounds: Normal breath sounds.  No wheezing, rhonchi or rales.  Abdominal:     Tenderness: There is no right CVA tenderness or left CVA tenderness.  Musculoskeletal:     Right lower leg: No edema.     Left lower leg: No edema.  Skin:    Findings: No rash.  Neurological:     General: No focal deficit present.     Mental Status: She is alert and oriented to person, place, and time. Mental status is at baseline.     Cranial Nerves: No cranial nerve deficit.     Sensory: No sensory  deficit.     Motor: No weakness.     Coordination: Coordination normal.     Gait: Gait normal.     Deep Tendon Reflexes: Reflexes normal.  Psychiatric:        Mood and Affect: Mood normal.        Behavior: Behavior normal.        Thought Content: Thought content normal. Thought content does not include homicidal or suicidal ideation. Thought content does not include homicidal or suicidal plan.        Judgment: Judgment normal.      No results found.  Assessment/plan: LENIYAH MARTELL is a 56 y.o. female present for Nonintractable headache, unspecified chronicity pattern, unspecified headache type/fatigue/B12 -Stable -Continue B12 injections every 2 weeks.  Patient self injects. - CBC, Comp Met (CMET, Magnesium,TSH, ANA--> Normal and prior headache work-ups. -Continue Imitrex as needed  Depression, recurrent ([place]) -overwhelmed. Increase Effexor 75 mg > 150 mg qd   Return in about 7 months (around 02/11/2023) for cpe (20 min), Routine chronic condition follow-up. Sooner if needed   No orders of the defined types were placed in this encounter.  Meds ordered this encounter  Medications   SUMAtriptan (IMITREX) 50 MG tablet    Sig: Take 1 tablet (50 mg total) by mouth every 2 (two) hours as needed for migraine. May repeat in 2 hours if headache persists or recurs.    Dispense:  10 tablet    Refill:  11   venlafaxine XR (EFFEXOR XR) 150 MG 24 hr capsule    Sig: Take 1 capsule (150 mg total) by mouth daily with breakfast.    Dispense:  90 capsule    Refill:  1   Referral Orders  No referral(s) requested today     Electronically signed by: Howard Pouch, [place]

## 2022-07-30 IMAGING — CT CT ABD-PELV W/ CM
2 of 5 series · 16 of 46 positions shown, 18 images · IV contrast (Omnipaque)
Comparison: None.

CLINICAL DATA: Abdominal pain, acute, nonlocalized.  Emesis.

EXAM:
CT ABDOMEN AND PELVIS WITH CONTRAST
TECHNIQUE: Multidetector CT imaging of the abdomen and pelvis was performed
using the standard protocol following bolus administration of
intravenous contrast.

[Series 2: axial st · axial · 0.84mm/px · z∈[+842,+1232]mm · 13 of 88 slices shown, 15 images]
[im 5/88  soft-tissue]
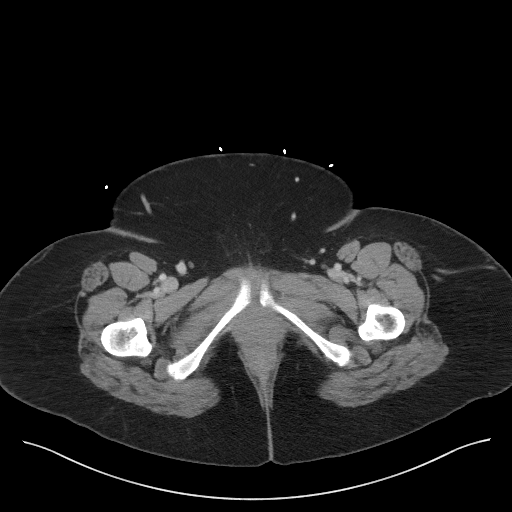
[im 5/88  bone]
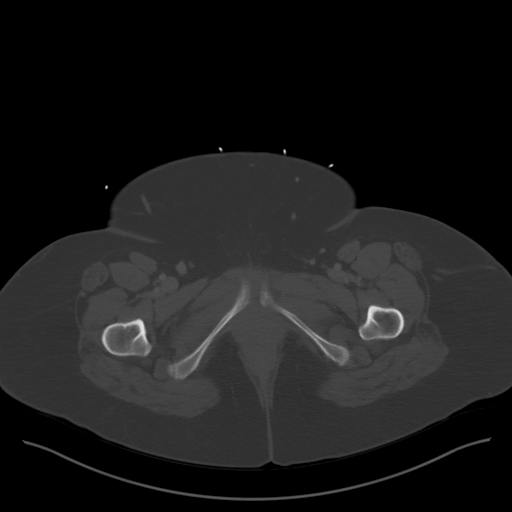
[im 14/88  soft-tissue]
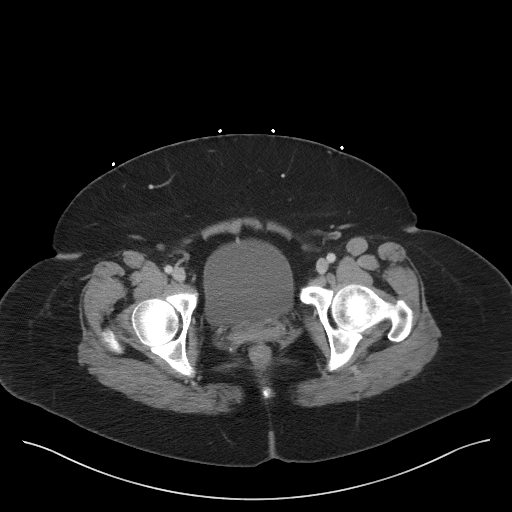
[im 19/88  soft-tissue]
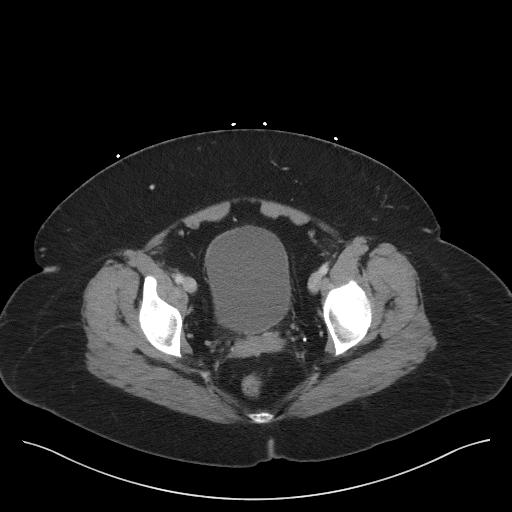
[im 23/88  soft-tissue]
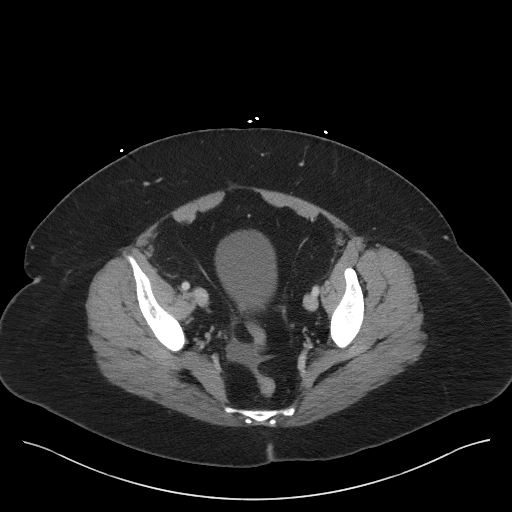
[im 33/88  soft-tissue]
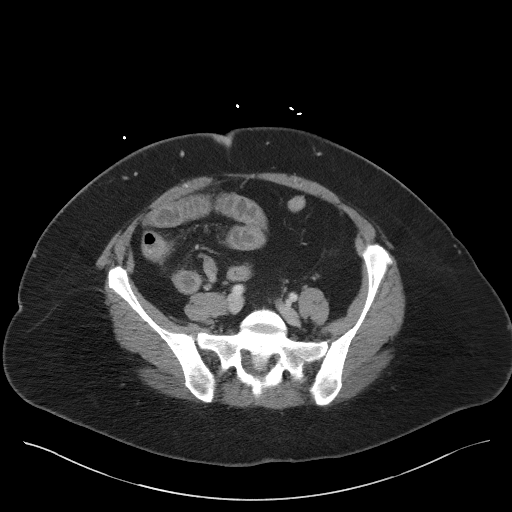
[im 37/88  soft-tissue]
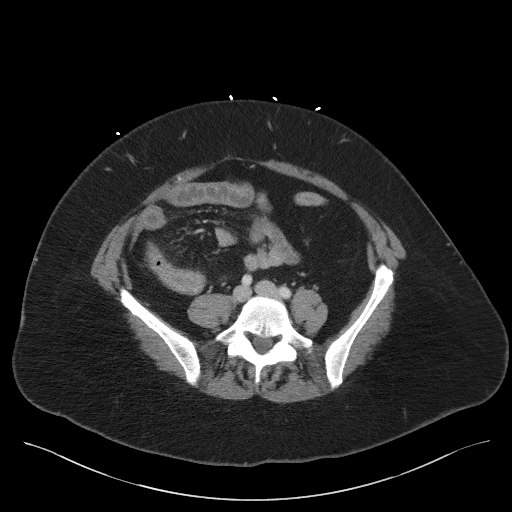
[im 46/88  soft-tissue]
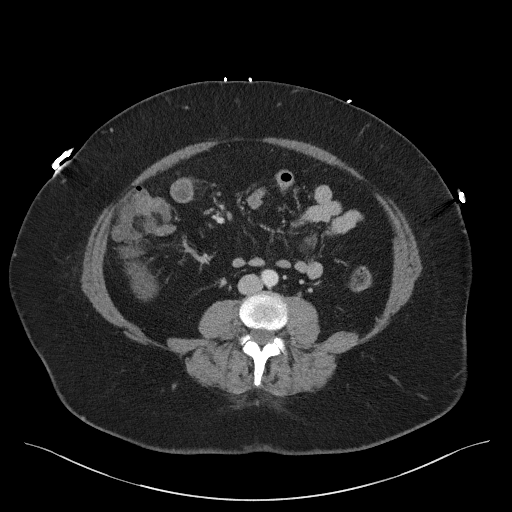
[im 51/88  soft-tissue]
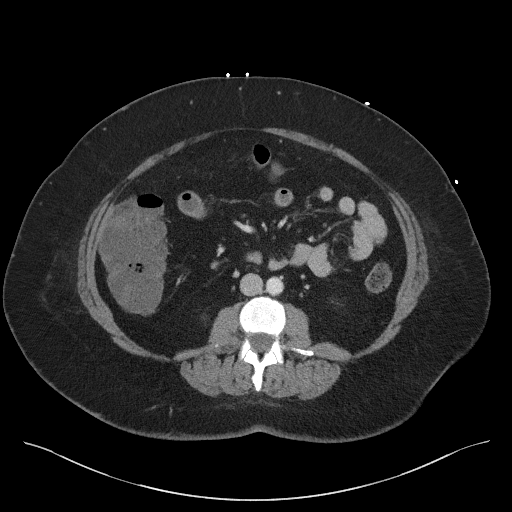
[im 55/88  soft-tissue]
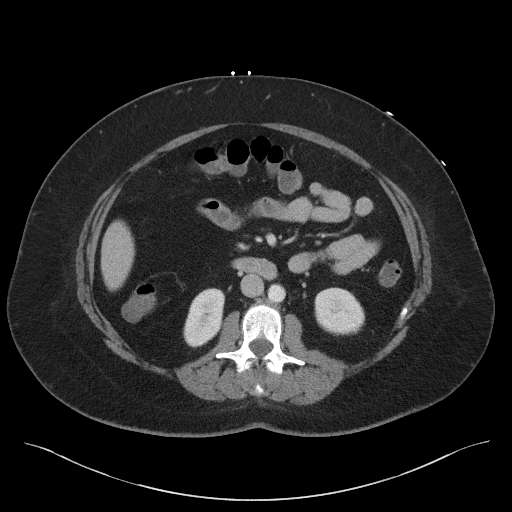
[im 55/88  bone]
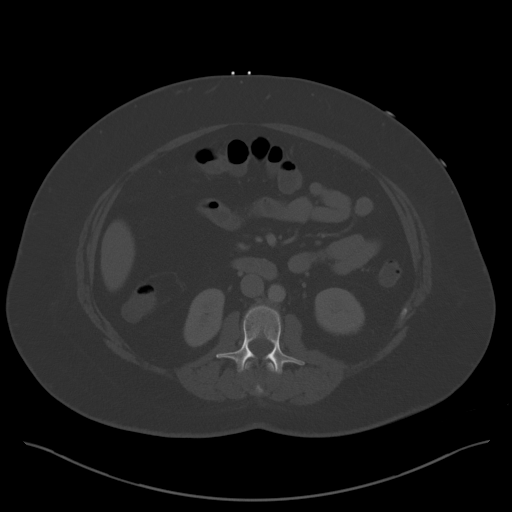
[im 65/88  soft-tissue]
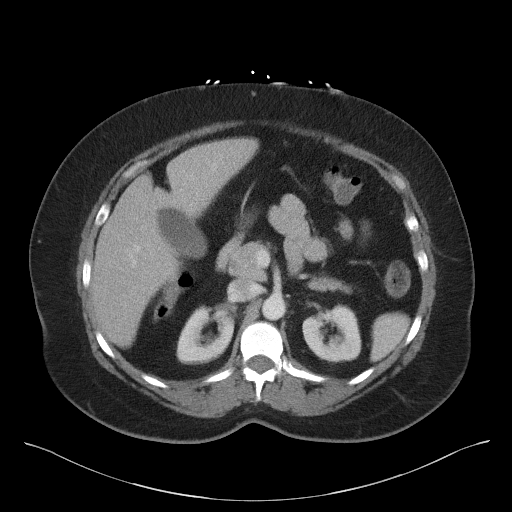
[im 69/88  soft-tissue]
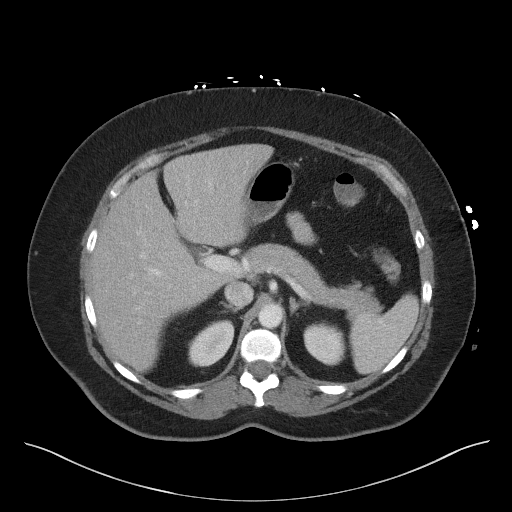
[im 74/88  soft-tissue]
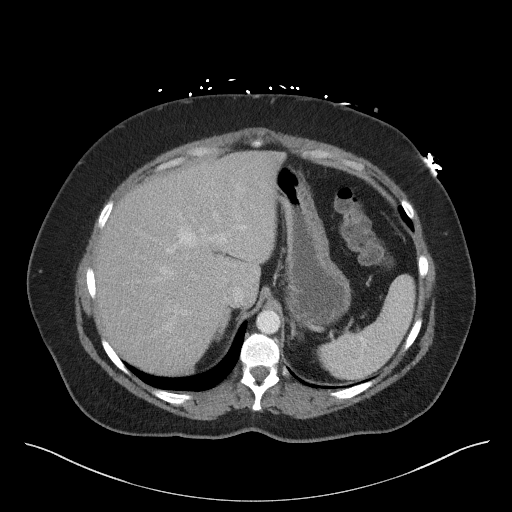
[im 83/88  soft-tissue]
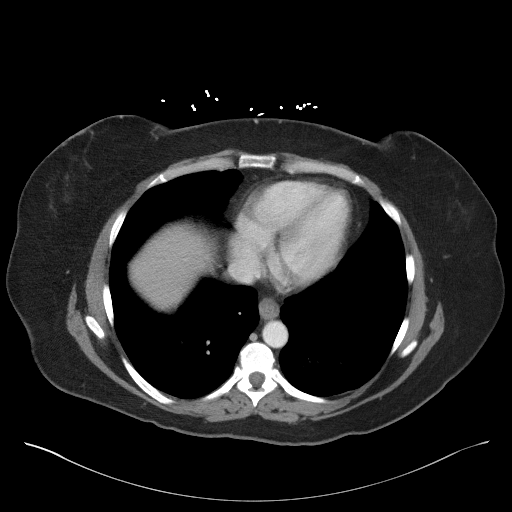

[Series 5: coronal st · coronal · 0.85mm/px · 3 of 100 slices shown]
[im 34/100  soft-tissue]
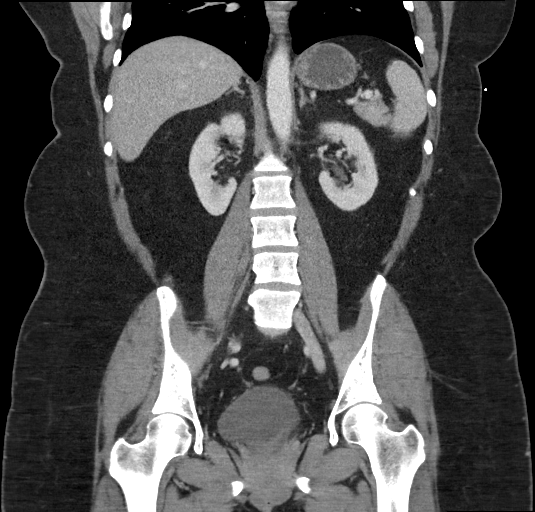
[im 45/100  soft-tissue]
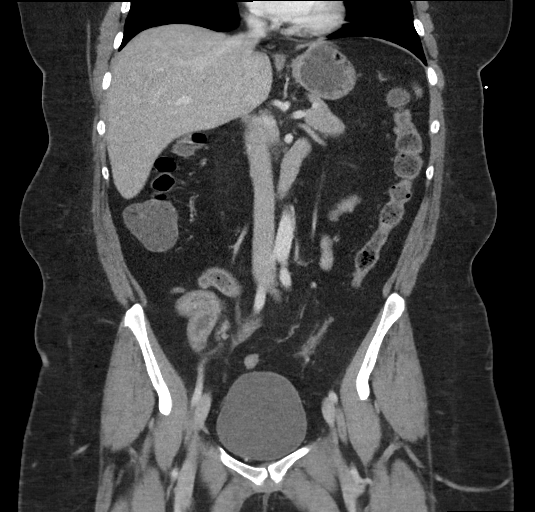
[im 56/100  soft-tissue]
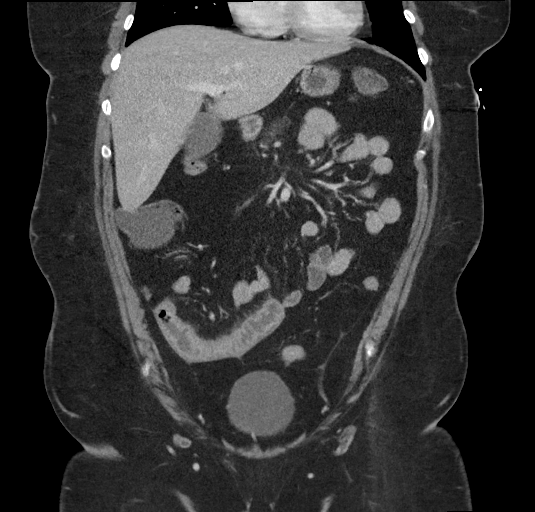

[16 of 46 positions shown; findings below may reference images not displayed]

RADIATION DOSE REDUCTION: This exam was performed according to the
departmental dose-optimization program which includes automated
exposure control, adjustment of the mA and/or kV according to
patient size and/or use of iterative reconstruction technique.

CONTRAST:  100mL OMNIPAQUE IOHEXOL 300 MG/ML  SOLN
FINDINGS: Lower chest: No acute abnormality.

Hepatobiliary: No focal liver abnormality. No gallstones,
gallbladder wall thickening, or pericholecystic fluid. No biliary
dilatation.

Pancreas: No focal lesion. Normal pancreatic contour. No surrounding
inflammatory changes. No main pancreatic ductal dilatation.

Spleen: Normal in size without focal abnormality.

Adrenals/Urinary Tract:

No adrenal nodule bilaterally.

Bilateral kidneys enhance symmetrically.

No hydronephrosis. No hydroureter.

The urinary bladder is unremarkable.

Stomach/Bowel: Stomach is within normal limits. No evidence of large
bowel wall thickening or dilatation. Long segment distal ileum small
bowel wall thickening and mild vascular engorgement. Appendix
appears normal.

Vascular/Lymphatic: No abdominal aorta or iliac aneurysm. Mild
atherosclerotic plaque of the aorta and its branches. No abdominal,
pelvic, or inguinal lymphadenopathy.

Reproductive: Status post hysterectomy. No adnexal masses.

Other: Trace volume free fluid within the pelvis. No intraperitoneal
free gas. No organized fluid collection.

Musculoskeletal:

No abdominal wall hernia or abnormality.

No suspicious lytic or blastic osseous lesions. No acute displaced
fracture.
IMPRESSION: Long segment ileitis. Differential diagnosis for etiology includes
inflammation, infection, ischemia.

## 2022-08-06 ENCOUNTER — Ambulatory Visit: Payer: BC Managed Care – PPO | Admitting: Gastroenterology

## 2022-08-06 ENCOUNTER — Other Ambulatory Visit: Payer: Self-pay

## 2022-09-04 ENCOUNTER — Ambulatory Visit: Payer: BC Managed Care – PPO | Admitting: Gastroenterology

## 2022-09-04 ENCOUNTER — Encounter: Payer: Self-pay | Admitting: Gastroenterology

## 2022-09-04 VITALS — BP 122/78 | HR 88 | Ht 61.0 in | Wt 196.0 lb

## 2022-09-04 DIAGNOSIS — K2100 Gastro-esophageal reflux disease with esophagitis, without bleeding: Secondary | ICD-10-CM | POA: Diagnosis not present

## 2022-09-04 DIAGNOSIS — K642 Third degree hemorrhoids: Secondary | ICD-10-CM | POA: Diagnosis not present

## 2022-09-04 NOTE — Progress Notes (Signed)
HPI : Cathy [name] is a very pleasant 56 year old female with history of depression who was initially seen by our office Carl Best) in March following an ER visit for abdominal pain.  She was noted to have ileitis on CT scan which was presumed infectious.  She had also been complaining of chronic dysphagia.  She underwent an EGD and colonoscopy April 18, 2022.  The colonoscopy was notable for a 3 mm cecal polyp, 10 mm transverse polyp and hemorrhoids.  The EGD showed LA grade B esophagitis, a mild stenosis which was dilated to 18 mm, hiatal hernia and mucosal changes of the esophagus suggestive of eosinophilic esophagitis. Biopsies of the esophagus showed inflammatory changes with elevated levels of eosinophils (20 per high-powered field distally, 17 per high-powered field proximally).  Both polyps removed were tubular adenomas, and she was recommended to repeat colonoscopy in 3 years. Repeat upper endoscopy July 7 showed resolution of the esophagitis, and no appreciable stricture or stenosis.  Repeat biopsies of the distal and proximal esophagus showed reactive changes, but no overt esophagitis and no eosinophils.  Today she reports improvement in her dysphagia and  throat irritation.  She has not had any heartburn recently.  She had been having issues with hoarseness and voice changes, but these have also resolved.  She does continue to have episodes of regurgitation of stomach contents a few times a week.  This occurs more often at night and after dinner.  She has noticed that this is improved with elevating her head of bed (she has an adjustable bed). Symptoms are also noticeable with certain foods such as tomato-based foods. She continues to take the omeprazole every morning. She does not drink coffee and she very rarely drinks alcohol.  She does consume chocolate regularly.  She has gained 30 pounds over the past year, which she attributes to decreased exercise and poor eating related to  stress and caring for her father who is terminally ill.  She does continue to have symptomatic hemorrhoids which have been a longstanding problem for her.  Symptoms consist prolapse and bleeding.   Past Medical History:  Diagnosis Date   Anemia    Colon polyp    Depression    Dyspnea on exertion    With atypical chest pain, followed by cardiology.   Genital warts 40/9811   TCA application by GYn   History of frequent urinary tract infections    Ileitis    Juvenile epilepsy ([place])    Migraines    Seizures ([place])    none since 1987- classified as epileptic seizures   Tendonitis of ankle or foot 05/2022   right   Thrombosed external hemorrhoid    Colonoscopy: April 18, 2022 (ileitis, hematochezia): 3 mm cecal tubular adenoma, 10 mm transverse tubular adenoma, hemorrhoids-recommended repeat 3 years  EGD April 18, 2022 (dysphagia): LA grade B esophagitis, low-grade esophageal stricture, dilated to 18 mm with balloon, hiatal hernia, mucosal changes in esophagus with elevated eosinophils (20, 17)  EGD June 29, 2022: Resolution of previously noted esophagitis, no discernible stricture.  Hill grade 3 valve.  Esophageal biopsies with reactive changes, no eosinophils  Past Surgical History:  Procedure Laterality Date   BILATERAL SALPINGECTOMY Bilateral 12/20/2014   Procedure: BILATERAL SALPINGECTOMY;  Surgeon: Logan Bores, MD;  Location: [facility];  Service: Gynecology;  Laterality: Bilateral;   COLONOSCOPY  03/2022   endocolon   HEMORRHOID SURGERY     thrombosed hemorrhoid   LAPAROSCOPIC ASSISTED VAGINAL HYSTERECTOMY N/A 12/20/2014  Procedure: LAPAROSCOPIC ASSISTED VAGINAL HYSTERECTOMY;  Surgeon: Logan Bores, MD;  Location: [facility];  Service: Gynecology;  Laterality: N/A;   tendon repair Right 12/24/2009   right ankle   TONSILLECTOMY  12/25/1971   UPPER GASTROINTESTINAL ENDOSCOPY     Family History  Problem Relation Age of Onset   Hyperlipidemia Mother    Heart disease  Mother    Hypertension Mother    Ovarian cancer Mother    Cervical cancer Mother    Heart attack Mother    Depression Sister    Asthma Sister    Dementia Maternal Grandmother    Heart attack Maternal Grandmother    Heart attack Maternal Grandfather    Heart attack Paternal Grandmother    Asthma Son    Colon cancer Neg Hx    Pancreatic cancer Neg Hx    Liver cancer Neg Hx    Esophageal cancer Neg Hx    Social History   Tobacco Use   Smoking status: Never   Smokeless tobacco: Never  Vaping Use   Vaping Use: Never used  Substance Use Topics   Alcohol use: Yes    Alcohol/week: 1.0 standard drink of alcohol    Types: 1 Standard drinks or equivalent per week    Comment: socially, rarely. less than monthly   Drug use: Never   Current Outpatient Medications  Medication Sig Dispense Refill   cyanocobalamin (,VITAMIN B-12,) 1000 MCG/ML injection Inject 1 mL (1,000 mcg total) into the muscle every 14 (fourteen) days. 2 mL 11   dicyclomine (BENTYL) 10 MG capsule Take 2 capsules (20 mg total) by mouth every 6 (six) hours as needed for spasms. 60 capsule 1   meloxicam (MOBIC) 15 MG tablet Take 1 tablet (15 mg total) by mouth daily. 30 tablet 1   omeprazole (PRILOSEC) 40 MG capsule Take 1 capsule (40 mg total) by mouth daily. 90 capsule 3   ondansetron (ZOFRAN) 4 MG tablet Take 1 tablet (4 mg total) by mouth every 8 (eight) hours as needed for nausea or vomiting. 20 tablet 0   SUMAtriptan (IMITREX) 50 MG tablet Take 1 tablet (50 mg total) by mouth every 2 (two) hours as needed for migraine. May repeat in 2 hours if headache persists or recurs. 10 tablet 11   Syringe/Needle, Disp, (SYRINGE 3CC/21GX1") 21G X 1" 3 ML MISC Inject 1 mL into the muscle every 14 (fourteen) days. Pt to inject 62m every 14 days intramuscular DX  E53.8 48 each 0   venlafaxine XR (EFFEXOR XR) 150 MG 24 hr capsule Take 1 capsule (150 mg total) by mouth daily with breakfast. 90 capsule 1   No current  facility-administered medications for this visit.   Allergies  Allergen Reactions   Macrobid [Nitrofurantoin Monohyd Macro] Anaphylaxis   Nitrofurantoin Macrocrystal Shortness Of Breath     Review of Systems: All systems reviewed and negative except where noted in HPI.    No results found.  Physical Exam: BP 122/78   Pulse 88   Ht '5\' 1"'$  (1.549 m)   Wt 196 lb (88.9 kg)   LMP 11/19/2014   SpO2 97%   BMI 37.03 kg/m  Constitutional: Pleasant,well-developed, Caucasian female in no acute distress. HEENT: Normocephalic and atraumatic. Conjunctivae are normal. No scleral icterus. Neck supple.  Cardiovascular: Normal rate, regular rhythm.  Pulmonary/chest: Effort normal and breath sounds normal. No wheezing, rales or rhonchi. Abdominal: Soft, nondistended, nontender. Bowel sounds active throughout. There are no masses palpable. No hepatomegaly. Extremities: no edema Neurological: Alert  and oriented to person place and time. Skin: Skin is warm and dry. No rashes noted. Psychiatric: Normal mood and affect. Behavior is normal.  CBC    Component Value Date/Time   WBC 6.4 03/20/2022 1041   RBC 4.36 03/20/2022 1041   HGB 13.6 03/20/2022 1041   HCT 41.1 03/20/2022 1041   PLT 232.0 03/20/2022 1041   MCV 94.5 03/20/2022 1041   MCH 31.2 03/04/2022 1530   MCHC 33.1 03/20/2022 1041   RDW 13.4 03/20/2022 1041   LYMPHSABS 1.8 03/20/2022 1041   MONOABS 0.4 03/20/2022 1041   EOSABS 0.2 03/20/2022 1041   BASOSABS 0.1 03/20/2022 1041    CMP     Component Value Date/Time   NA 140 03/12/2022 0914   NA 142 06/18/2017 1114   K 3.8 03/12/2022 0914   CL 104 03/12/2022 0914   CO2 30 03/12/2022 0914   GLUCOSE 84 03/12/2022 0914   BUN 14 03/12/2022 0914   BUN 8 06/18/2017 1114   CREATININE 0.79 03/12/2022 0914   CREATININE 0.93 09/27/2017 1452   CALCIUM 9.0 03/12/2022 0914   PROT 6.4 03/12/2022 0914   PROT 7.0 06/18/2017 1114   ALBUMIN 4.3 03/12/2022 0914   ALBUMIN 4.6 06/18/2017  1114   AST 14 03/12/2022 0914   ALT 19 03/12/2022 0914   ALKPHOS 70 03/12/2022 0914   BILITOT 0.4 03/12/2022 0914   BILITOT 0.3 06/18/2017 1114   GFRNONAA >60 03/04/2022 1530   GFRAA 90 06/18/2017 1114     ASSESSMENT AND PLAN: 56 year old female with recent history of presumed infectious ileitis, currently resolved.  Colonoscopy with no evidence of ileal inflammation.  She also has been having symptoms of dysphagia and typical as well as atypical GERD symptoms and was found to have reflux esophagitis and a low-grade esophageal stricture.  These abnormalities resolved with PPI therapy.  Today, she reports resolution of many of her GERD symptoms, but does continue to have episodes of regurgitation primarily at night. We discussed the pathophysiology of GERD and the principles of GERD management to include lifestyle modifications  such as dietary discretion (avoidance of alcohol, tobacco, caffeinated and carbonated beverages, spicy/greasy foods, citrus, peppermint/chocolate), weight loss if applicable, head of bed elevation andconsuming last meal of day within 3 hours of bedtime; pharmacologic options to include PPIs, H2RAs and OTC antacids; and finally surgical or endoscopic fundoplication. I suggested that the patient continue taking omeprazole every day, but consider adding as needed Gaviscon or Pepcid at night.  As acid suppressing medicines did not prevent reflux, we discussed how she would likely benefit from behavioral modifications, namely avoiding meals within 3 to 4 hours of bedtime and weight loss. She was provided handouts on GERD as well as TIF. We also discussed management of her hemorrhoids.  She has not been taking fiber supplementation, which may help reduce her bleeding and prolapse.  We reviewed the role of hemorrhoid banding and improving symptoms of bleeding and prolapse, and she will consider this if she is not experiencing improvement with fiber supplementation.   GERD with  esophagitis - Continue omeprazole 40 mg PO daily - Dietary/behavioral modifications - Information provided on TIF  Hemorrhoids - Metamucil daily - Information provided on hemorrhoid banding if desired  Infectious ileitis, resolved - No further evaluation  Rashmi Tallent E. Candis Schatz, MD [facility]   CC:  Ma Hillock, DO

## 2022-09-04 NOTE — Patient Instructions (Signed)
If you are age 55 or older, your body mass index should be between 23-30. Your Body mass index is 37.03 kg/m. If this is out of the aforementioned range listed, please consider follow up with your Primary Care Provider.  If you are age 31 or younger, your body mass index should be between 19-25. Your Body mass index is 37.03 kg/m. If this is out of the aformentioned range listed, please consider follow up with your Primary Care Provider.   Continue Omeprazole daily.   Try Gaviscon or Pepcid at night.  Follow up as needed.  The [facility] providers would like to encourage you to use [facility] to communicate with providers for non-urgent requests or questions.  Due to long hold times on the telephone, sending your provider a message by [facility] may be a faster and more efficient way to get a response.  Please allow 48 business hours for a response.  Please remember that this is for non-urgent requests.   It was a pleasure to see you today!  Thank you for trusting me with your gastrointestinal care!    Scott E.Candis Schatz, MD

## 2022-11-06 DIAGNOSIS — R140 Abdominal distension (gaseous): Secondary | ICD-10-CM | POA: Diagnosis not present

## 2023-01-01 ENCOUNTER — Other Ambulatory Visit: Payer: Self-pay

## 2023-01-01 MED ORDER — CYANOCOBALAMIN 1000 MCG/ML IJ SOLN: 1000.0000 ug | INTRAMUSCULAR | 0 refills | Status: DC

## 2023-01-02 DIAGNOSIS — N83201 Unspecified ovarian cyst, right side: Secondary | ICD-10-CM | POA: Diagnosis not present

## 2023-01-14 ENCOUNTER — Ambulatory Visit: Payer: BC Managed Care – PPO | Admitting: Family Medicine

## 2023-01-14 ENCOUNTER — Encounter: Payer: Self-pay | Admitting: Family Medicine

## 2023-01-14 VITALS — BP 120/79 | HR 84 | Temp 98.1°F | Wt 200.6 lb

## 2023-01-14 DIAGNOSIS — M7989 Other specified soft tissue disorders: Secondary | ICD-10-CM | POA: Diagnosis not present

## 2023-01-14 DIAGNOSIS — I809 Phlebitis and thrombophlebitis of unspecified site: Secondary | ICD-10-CM | POA: Diagnosis not present

## 2023-01-14 DIAGNOSIS — I879 Disorder of vein, unspecified: Secondary | ICD-10-CM

## 2023-01-14 MED ORDER — NAPROXEN 500 MG PO TABS: 500.0000 mg | ORAL_TABLET | Freq: Every day | ORAL | 0 refills | Status: AC

## 2023-01-14 NOTE — Patient Instructions (Signed)
No follow-ups on file.        Great to see you today.  I have refilled the medication(s) we provide.   If labs were collected, we will inform you of lab results once received either by echart message or telephone call.   - echart message- for normal results that have been seen by the patient already.   - telephone call: abnormal results or if patient has not viewed results in their echart.  

## 2023-01-14 NOTE — Progress Notes (Signed)
Cathy [name] , [birth year], 57 y.o., female MRN: [id number] Patient Care Team    Relationship Specialty Notifications Start End  Ma Hillock, DO PCP - General Family Medicine  10/07/17   Lorretta Harp, MD Consulting Physician Cardiology  09/27/17   Paula Compton, MD Consulting Physician Obstetrics and Gynecology  09/27/17     Chief Complaint  Patient presents with   Leg Pain     Subjective: Pt presents for an OV with complaints of welling that started in her left lateral leg 3-4 weeks ago.  She did not think much of it until she started to see discoloration around her veins.  She reports mild tenderness with palpation over the area, otherwise it is not bothering her.  She has noticed last week it has started on the medial aspect of her right leg.  She denies any personal or family history of blood clots.  She denies any recent travel.  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  She is not on hormone replacement.     01/14/2023    1:31 PM 07/17/2022    9:52 AM 02/08/2022    9:21 AM 12/14/2021   10:40 AM 03/02/2019    2:19 PM  Depression screen PHQ 2/9  Decreased Interest 0 '1 1 2 2  '$ Down, Depressed, Hopeless 0 '1 1 1 3  '$ PHQ - 2 Score 0 '2 2 3 5  '$ Altered sleeping  '2 2 2 2  '$ Tired, decreased energy  '3 2 2 2  '$ Change in appetite  '3 3 3 2  '$ Feeling bad or failure about yourself   '1 1 1 3  '$ Trouble concentrating  '2 1 2 2  '$ Moving slowly or fidgety/restless  1 0 1 1  Suicidal thoughts  1 0 0 0  PHQ-9 Score  '15 11 14 17  '$ Difficult doing work/chores     Very difficult    Allergies  Allergen Reactions   Macrobid [Nitrofurantoin Monohyd Macro] Anaphylaxis   Nitrofurantoin Macrocrystal Shortness Of Breath   Social History   Social History Narrative   Divorced. 3 children. Bachelors degree. Works as an Optometrist.   Exercises routinely.   Wears her seatbelt. Wears a bicycle helmet. Smoke detector in the home.   Feels safe in her relationships.   Past Medical History:   Diagnosis Date   Anemia    Colon polyp    Depression    Dyspnea on exertion    With atypical chest pain, followed by cardiology.   Genital warts 80/9983   TCA application by GYn   History of frequent urinary tract infections    Ileitis    Juvenile epilepsy ([place])    Migraines    Seizures ([place])    none since 1987- classified as epileptic seizures   Tendonitis of ankle or foot 05/2022   right   Thrombosed external hemorrhoid    Past Surgical History:  Procedure Laterality Date   BILATERAL SALPINGECTOMY Bilateral 12/20/2014   Procedure: BILATERAL SALPINGECTOMY;  Surgeon: Logan Bores, MD;  Location: [facility];  Service: Gynecology;  Laterality: Bilateral;   COLONOSCOPY  03/2022   endocolon   HEMORRHOID SURGERY     thrombosed hemorrhoid   LAPAROSCOPIC ASSISTED VAGINAL HYSTERECTOMY N/A 12/20/2014   Procedure: LAPAROSCOPIC ASSISTED VAGINAL HYSTERECTOMY;  Surgeon: Logan Bores, MD;  Location: [facility];  Service: Gynecology;  Laterality: N/A;   tendon repair Right 12/24/2009   right ankle   TONSILLECTOMY  12/25/1971  UPPER GASTROINTESTINAL ENDOSCOPY     Family History  Problem Relation Age of Onset   Hyperlipidemia Mother    Heart disease Mother    Hypertension Mother    Ovarian cancer Mother    Cervical cancer Mother    Heart attack Mother    Depression Sister    Asthma Sister    Dementia Maternal Grandmother    Heart attack Maternal Grandmother    Heart attack Maternal Grandfather    Heart attack Paternal Grandmother    Asthma Son    Colon cancer Neg Hx    Pancreatic cancer Neg Hx    Liver cancer Neg Hx    Esophageal cancer Neg Hx    Allergies as of 01/14/2023       Reactions   Macrobid [nitrofurantoin Monohyd Macro] Anaphylaxis   Nitrofurantoin Macrocrystal Shortness Of Breath        Medication List        Accurate as of January 14, 2023  1:52 PM. If you have any questions, ask your nurse or doctor.          STOP taking these medications     meloxicam 15 MG tablet Commonly known as: MOBIC Stopped by: Howard Pouch, DO       TAKE these medications    cyanocobalamin 1000 MCG/ML injection Commonly known as: VITAMIN B12 Inject 1 mL (1,000 mcg total) into the muscle every 14 (fourteen) days.   dicyclomine 10 MG capsule Commonly known as: BENTYL Take 2 capsules (20 mg total) by mouth every 6 (six) hours as needed for spasms.   naproxen 500 MG tablet Commonly known as: Naprosyn Take 1 tablet (500 mg total) by mouth daily for 7 doses. Started by: Howard Pouch, DO   omeprazole 40 MG capsule Commonly known as: PRILOSEC Take 1 capsule (40 mg total) by mouth daily.   ondansetron 4 MG tablet Commonly known as: ZOFRAN Take 1 tablet (4 mg total) by mouth every 8 (eight) hours as needed for nausea or vomiting.   SUMAtriptan 50 MG tablet Commonly known as: Imitrex Take 1 tablet (50 mg total) by mouth every 2 (two) hours as needed for migraine. May repeat in 2 hours if headache persists or recurs.   SYRINGE 3CC/21GX1" 21G X 1" 3 ML Misc Inject 1 mL into the muscle every 14 (fourteen) days. Pt to inject 86m every 14 days intramuscular DX  E53.8   venlafaxine XR 150 MG 24 hr capsule Commonly known as: Effexor XR Take 1 capsule (150 mg total) by mouth daily with breakfast.        All past medical history, surgical history, allergies, family history, immunizations andmedications were updated in the EMR today and reviewed under the history and medication portions of their EMR.     ROS Negative, with the exception of above mentioned in HPI   Objective:  BP 120/79   Pulse 84   Temp 98.1 F (36.7 C)   Wt 200 lb 9.6 oz (91 kg)   LMP 11/19/2014   SpO2 97%   BMI 37.90 kg/m  Body mass index is 37.9 kg/m. Physical Exam Vitals and nursing note reviewed.  Constitutional:      General: She is not in acute distress.    Appearance: Normal appearance. She is normal weight. She is not ill-appearing or toxic-appearing.   HENT:     Head: Normocephalic and atraumatic.  Eyes:     General: No scleral icterus.       Right eye: No discharge.  Left eye: No discharge.     Extraocular Movements: Extraocular movements intact.     Conjunctiva/sclera: Conjunctivae normal.     Pupils: Pupils are equal, round, and reactive to light.  Musculoskeletal:        General: Swelling and tenderness present.     Comments: Left lateral lower extremity with swelling and reticulated hyperpigmentation, tender to palpation.  No erythema. Right medial calf with mild redness in reticulated pattern  Skin:    Findings: No rash.  Neurological:     Mental Status: She is alert and oriented to person, place, and time. Mental status is at baseline.     Motor: No weakness.     Coordination: Coordination normal.     Gait: Gait normal.  Psychiatric:        Mood and Affect: Mood normal.        Behavior: Behavior normal.        Thought Content: Thought content normal.        Judgment: Judgment normal.     No results found. No results found. No results found for this or any previous visit (from the past 24 hour(s)).  Assessment/Plan: CONSUELLO LASSALLE is a 57 y.o. female present for OV for  Phlebitis Area of discoloration appears consistent with phlebitis, although it is significant area of concern reticulated pattern.  We discussed starting with compression stockings and NSAIDs. - Compression stockings No fevers or chills, no redness.  Less likely to be infection.  Swelling of lower leg/ Vein disorder With swelling present, and reticulated pattern, cannot rule out DVT.  Will obtain ultrasound of bilateral legs since she is starting to have symptoms on the right side as well. - US Venous Img Lower Bilateral (DVT); Future Follow-up depending upon clinical outcome and ultrasound result.   Reviewed expectations re: course of current medical issues. Discussed self-management of symptoms. Outlined signs and symptoms indicating  need for more acute intervention. Patient verbalized understanding and all questions were answered. Patient received an After-Visit Summary.    Orders Placed This Encounter  Procedures   Compression stockings   US Venous Img Lower Bilateral (DVT)   Meds ordered this encounter  Medications   naproxen (NAPROSYN) 500 MG tablet    Sig: Take 1 tablet (500 mg total) by mouth daily for 7 doses.    Dispense:  30 tablet    Refill:  0   Referral Orders  No referral(s) requested today     Note is dictated utilizing voice recognition software. Although note has been proof read prior to signing, occasional typographical errors still can be missed. If any questions arise, please do not hesitate to call for verification.   electronically signed by:  Howard Pouch, DO  [place]

## 2023-01-15 ENCOUNTER — Telehealth: Payer: Self-pay | Admitting: Family Medicine

## 2023-01-15 ENCOUNTER — Ambulatory Visit (HOSPITAL_BASED_OUTPATIENT_CLINIC_OR_DEPARTMENT_OTHER)
Admission: RE | Admit: 2023-01-15 | Discharge: 2023-01-15 | Disposition: A | Discharge status: Home / Self Care | Payer: BC Managed Care – PPO | Source: Ambulatory Visit | Attending: Family Medicine | Admitting: Family Medicine

## 2023-01-15 DIAGNOSIS — I879 Disorder of vein, unspecified: Secondary | ICD-10-CM

## 2023-01-15 DIAGNOSIS — M79662 Pain in left lower leg: Secondary | ICD-10-CM | POA: Diagnosis not present

## 2023-01-15 DIAGNOSIS — M79661 Pain in right lower leg: Secondary | ICD-10-CM | POA: Diagnosis not present

## 2023-01-15 DIAGNOSIS — I809 Phlebitis and thrombophlebitis of unspecified site: Secondary | ICD-10-CM

## 2023-01-15 DIAGNOSIS — M7989 Other specified soft tissue disorders: Secondary | ICD-10-CM | POA: Insufficient documentation

## 2023-01-15 DIAGNOSIS — R600 Localized edema: Secondary | ICD-10-CM | POA: Diagnosis not present

## 2023-01-15 NOTE — Telephone Encounter (Signed)
Please inform patient her doppler studies are normal. No signs of DVT/blood clot. Take meds as prescribed for 5-7 days and wear the compression stockings (please make sure she received her script)  I have placed an order to vascular/vein center to further evaluate and make recs.

## 2023-01-16 NOTE — Telephone Encounter (Signed)
Spoke with pt regarding labs and instructions.   

## 2023-01-28 ENCOUNTER — Other Ambulatory Visit: Payer: Self-pay | Admitting: *Deleted

## 2023-01-28 ENCOUNTER — Ambulatory Visit: Payer: BC Managed Care – PPO | Admitting: Family Medicine

## 2023-01-28 ENCOUNTER — Encounter: Payer: Self-pay | Admitting: Family Medicine

## 2023-01-28 VITALS — BP 101/67 | HR 86 | Temp 98.4°F | Wt 201.6 lb

## 2023-01-28 DIAGNOSIS — M7989 Other specified soft tissue disorders: Secondary | ICD-10-CM

## 2023-01-28 DIAGNOSIS — I809 Phlebitis and thrombophlebitis of unspecified site: Secondary | ICD-10-CM | POA: Diagnosis not present

## 2023-01-28 DIAGNOSIS — M79605 Pain in left leg: Secondary | ICD-10-CM | POA: Diagnosis not present

## 2023-01-28 DIAGNOSIS — M79604 Pain in right leg: Secondary | ICD-10-CM

## 2023-01-28 NOTE — Progress Notes (Signed)
Cathy [name] , September 01, 1966, 57 y.o., female MRN: [id number] Patient Care Team    Relationship Specialty Notifications Start End  Ma Hillock, DO PCP - General Family Medicine  10/07/17   Lorretta Harp, MD Consulting Physician Cardiology  09/27/17   Paula Compton, MD Consulting Physician Obstetrics and Gynecology  09/27/17     Chief Complaint  Patient presents with   phlebitis    Vas spec [place] thur and f/u on friday     Subjective: Cathy [name] Leisure is a 57 y.o. female present for lower ext swelling/phlebitis follow up. She is wearing her compression stockings. She has vascular appt scheduled this week. Venous doppler studies were negative for DVT. Swelling has improved some. Prior note: Pt presents for an OV with complaints of welling that started in her left lateral leg 3-4 weeks ago.  She did not think much of it until she started to see discoloration around her veins.  She reports mild tenderness with palpation over the area, otherwise it is not bothering her.  She has noticed last week it has started on the medial aspect of her right leg.  She denies any personal or family history of blood clots.  She denies any recent travel.  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  She is not on hormone replacement.      01/14/2023    1:31 PM 07/17/2022    9:52 AM 02/08/2022    9:21 AM 12/14/2021   10:40 AM 03/02/2019    2:19 PM  Depression screen PHQ 2/9  Decreased Interest 0 '1 1 2 2  '$ Down, Depressed, Hopeless 0 '1 1 1 3  '$ PHQ - 2 Score 0 '2 2 3 5  '$ Altered sleeping  '2 2 2 2  '$ Tired, decreased energy  '3 2 2 2  '$ Change in appetite  '3 3 3 2  '$ Feeling bad or failure about yourself   '1 1 1 3  '$ Trouble concentrating  '2 1 2 2  '$ Moving slowly or fidgety/restless  1 0 1 1  Suicidal thoughts  1 0 0 0  PHQ-9 Score  '15 11 14 17  '$ Difficult doing work/chores     Very difficult    Allergies  Allergen Reactions   Macrobid [Nitrofurantoin Monohyd Macro] Anaphylaxis   Nitrofurantoin Macrocrystal  Shortness Of Breath   Social History   Social History Narrative   Divorced. 3 children. Bachelors degree. Works as an Optometrist.   Exercises routinely.   Wears her seatbelt. Wears a bicycle helmet. Smoke detector in the home.   Feels safe in her relationships.   Past Medical History:  Diagnosis Date   Anemia    Colon polyp    Depression    Dyspnea on exertion    With atypical chest pain, followed by cardiology.   Genital warts 75/1025   TCA application by GYn   History of frequent urinary tract infections    Ileitis    Juvenile epilepsy ([place])    Migraines    Seizures ([place])    none since 1987- classified as epileptic seizures   Tendonitis of ankle or foot 05/2022   right   Thrombosed external hemorrhoid    Past Surgical History:  Procedure Laterality Date   BILATERAL SALPINGECTOMY Bilateral 12/20/2014   Procedure: BILATERAL SALPINGECTOMY;  Surgeon: Logan Bores, MD;  Location: [facility];  Service: Gynecology;  Laterality: Bilateral;   COLONOSCOPY  03/2022   endocolon   HEMORRHOID SURGERY  thrombosed hemorrhoid   LAPAROSCOPIC ASSISTED VAGINAL HYSTERECTOMY N/A 12/20/2014   Procedure: LAPAROSCOPIC ASSISTED VAGINAL HYSTERECTOMY;  Surgeon: Logan Bores, MD;  Location: [facility];  Service: Gynecology;  Laterality: N/A;   tendon repair Right 12/24/2009   right ankle   TONSILLECTOMY  12/25/1971   UPPER GASTROINTESTINAL ENDOSCOPY     Family History  Problem Relation Age of Onset   Hyperlipidemia Mother    Heart disease Mother    Hypertension Mother    Ovarian cancer Mother    Cervical cancer Mother    Heart attack Mother    Depression Sister    Asthma Sister    Dementia Maternal Grandmother    Heart attack Maternal Grandmother    Heart attack Maternal Grandfather    Heart attack Paternal Grandmother    Asthma Son    Colon cancer Neg Hx    Pancreatic cancer Neg Hx    Liver cancer Neg Hx    Esophageal cancer Neg Hx    Allergies as of 01/28/2023        Reactions   Macrobid [nitrofurantoin Monohyd Macro] Anaphylaxis   Nitrofurantoin Macrocrystal Shortness Of Breath        Medication List        Accurate as of January 28, 2023  8:39 AM. If you have any questions, ask your nurse or doctor.          cyanocobalamin 1000 MCG/ML injection Commonly known as: VITAMIN B12 Inject 1 mL (1,000 mcg total) into the muscle every 14 (fourteen) days.   dicyclomine 10 MG capsule Commonly known as: BENTYL Take 2 capsules (20 mg total) by mouth every 6 (six) hours as needed for spasms.   omeprazole 40 MG capsule Commonly known as: PRILOSEC Take 1 capsule (40 mg total) by mouth daily.   ondansetron 4 MG tablet Commonly known as: ZOFRAN Take 1 tablet (4 mg total) by mouth every 8 (eight) hours as needed for nausea or vomiting.   SUMAtriptan 50 MG tablet Commonly known as: Imitrex Take 1 tablet (50 mg total) by mouth every 2 (two) hours as needed for migraine. May repeat in 2 hours if headache persists or recurs.   SYRINGE 3CC/21GX1" 21G X 1" 3 ML Misc Inject 1 mL into the muscle every 14 (fourteen) days. Pt to inject 109m every 14 days intramuscular DX  E53.8   venlafaxine XR 150 MG 24 hr capsule Commonly known as: Effexor XR Take 1 capsule (150 mg total) by mouth daily with breakfast.        All past medical history, surgical history, allergies, family history, immunizations andmedications were updated in the EMR today and reviewed under the history and medication portions of their EMR.     ROS Negative, with the exception of above mentioned in HPI   Objective:  BP 101/67   Pulse 86   Temp 98.4 F (36.9 C)   Wt 201 lb 9.6 oz (91.4 kg)   LMP 11/19/2014   SpO2 96%   BMI 38.09 kg/m    Body mass index is 38.09 kg/m. Physical Exam Vitals and nursing note reviewed.  Constitutional:      General: She is not in acute distress.    Appearance: Normal appearance. She is normal weight. She is not ill-appearing or toxic-appearing.   HENT:     Head: Normocephalic and atraumatic.  Eyes:     General: No scleral icterus.       Right eye: No discharge.  Left eye: No discharge.     Extraocular Movements: Extraocular movements intact.     Conjunctiva/sclera: Conjunctivae normal.     Pupils: Pupils are equal, round, and reactive to light.  Musculoskeletal:     Right lower leg: No edema.     Left lower leg: No edema.  Skin:    Findings: No erythema or rash.     Comments: Hyperpigmented reticular pattern remains left lateral ext.   Neurological:     Mental Status: She is alert and oriented to person, place, and time. Mental status is at baseline.     Motor: No weakness.     Coordination: Coordination normal.     Gait: Gait normal.  Psychiatric:        Mood and Affect: Mood normal.        Behavior: Behavior normal.        Thought Content: Thought content normal.        Judgment: Judgment normal.      No results found. No results found. No results found for this or any previous visit (from the past 24 hour(s)).  Assessment/Plan: AZARIYA FREEMAN is a 57 y.o. female present for OV for  Phlebitis Improved. significant area of hyperpigmented reticulated pattern remains. Swelling improved.  Continue nsaids Continue> Compression stockings seem to helping some. No fevers or chills, no redness.  Less likely to be infection. She has appt with vascular.   Reviewed expectations re: course of current medical issues. Discussed self-management of symptoms. Outlined signs and symptoms indicating need for more acute intervention. Patient verbalized understanding and all questions were answered. Patient received an After-Visit Summary.    No orders of the defined types were placed in this encounter.  No orders of the defined types were placed in this encounter.  Referral Orders  No referral(s) requested today     Note is dictated utilizing voice recognition software. Although note has been proof read prior  to signing, occasional typographical errors still can be missed. If any questions arise, please do not hesitate to call for verification.   electronically signed by:  Howard Pouch, DO  [place]

## 2023-01-29 ENCOUNTER — Other Ambulatory Visit: Payer: Self-pay | Admitting: *Deleted

## 2023-01-29 DIAGNOSIS — M79604 Pain in right leg: Secondary | ICD-10-CM

## 2023-01-31 ENCOUNTER — Ambulatory Visit (HOSPITAL_COMMUNITY)
Admission: RE | Admit: 2023-01-31 | Discharge: 2023-01-31 | Disposition: A | Discharge status: Home / Self Care | Payer: BC Managed Care – PPO | Source: Ambulatory Visit | Attending: Vascular Surgery | Admitting: Vascular Surgery

## 2023-01-31 DIAGNOSIS — M79605 Pain in left leg: Secondary | ICD-10-CM | POA: Insufficient documentation

## 2023-01-31 DIAGNOSIS — M79604 Pain in right leg: Secondary | ICD-10-CM | POA: Insufficient documentation

## 2023-02-01 ENCOUNTER — Encounter: Payer: BC Managed Care – PPO | Admitting: Vascular Surgery

## 2023-02-07 NOTE — Progress Notes (Incomplete)
Office Note     CC: Bilateral lower extremity swelling, left greater than right Requesting Provider:  Ma Hillock, DO  HPI: Cathy [name] is a 57 y.o. (1966/07/02) female who presents at the request of Kuneff, Renee A, DO for evaluation of bilateral lower extremity swelling, left greater than right.  On exam today, Cathy [name] was doing well.  A resident of [place], she has 3 children and works as an Optometrist by trade.    Over the last 2 years, Cathy [name] has struggled with weight gain, specifically gaining over 50 pounds.  She has appreciated bilateral lower extremity swelling above ankle which worsens by days end.  She has also appreciated some color changes on the lateral aspect of her left leg.  Cathy [name] has no history of vascular or vein surgeries.  She has worn compression stockings intermittently in the past, which she says helps with some of her symptoms, specifically the heaviness she appreciates by days end.  She denies varicose veins, ulcerations, bleeding events.  No history of DVT.  Past Medical History:  Diagnosis Date   Anemia    Colon polyp    Depression    Dyspnea on exertion    With atypical chest pain, followed by cardiology.   Genital warts 0000000   TCA application by GYn   History of frequent urinary tract infections    Ileitis    Juvenile epilepsy ([place])    Migraines    Seizures ([place])    none since 1987- classified as epileptic seizures   Tendonitis of ankle or foot 05/2022   right   Thrombosed external hemorrhoid     Past Surgical History:  Procedure Laterality Date   BILATERAL SALPINGECTOMY Bilateral 12/20/2014   Procedure: BILATERAL SALPINGECTOMY;  Surgeon: Logan Bores, MD;  Location: [facility];  Service: Gynecology;  Laterality: Bilateral;   COLONOSCOPY  03/2022   endocolon   HEMORRHOID SURGERY     thrombosed hemorrhoid   LAPAROSCOPIC ASSISTED VAGINAL HYSTERECTOMY N/A 12/20/2014   Procedure: LAPAROSCOPIC ASSISTED VAGINAL HYSTERECTOMY;  Surgeon:  Logan Bores, MD;  Location: [facility];  Service: Gynecology;  Laterality: N/A;   tendon repair Right 12/24/2009   right ankle   TONSILLECTOMY  12/25/1971   UPPER GASTROINTESTINAL ENDOSCOPY      Social History   Socioeconomic History   Marital status: Divorced    Spouse name: Not on file   Number of children: 3   Years of education: 16   Highest education level: Not on file  Occupational History   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 less than monthly   Drug use: Never   Sexual activity: Yes    Partners: Male    Birth control/protection: Post-menopausal, Surgical    Comment: partial hysterectemy  Other Topics Concern   Not on file  Social History Narrative   Divorced. 3 children. Bachelors degree. Works as an Optometrist.   Exercises routinely.   Wears her seatbelt. Wears a bicycle helmet. Smoke detector in the home.   Feels safe in her relationships.   Social Determinants of Health   Financial Resource Strain: Not on file  Food Insecurity: Not  on file  Transportation Needs: Not on file  Physical Activity: Not on file  Stress: Not on file  Social Connections: Not on file  Intimate Partner Violence: Not on file   Family History  Problem Relation Age of Onset   Hyperlipidemia Mother    Heart disease Mother    Hypertension Mother    Ovarian cancer Mother    Cervical cancer Mother    Heart attack Mother    Depression Sister    Asthma Sister    Dementia Maternal Grandmother    Heart attack Maternal Grandmother    Heart attack Maternal Grandfather    Heart attack Paternal Grandmother    Asthma Son    Colon cancer Neg Hx    Pancreatic cancer Neg Hx     Liver cancer Neg Hx    Esophageal cancer Neg Hx     Current Outpatient Medications  Medication Sig Dispense Refill   cyanocobalamin (VITAMIN B12) 1000 MCG/ML injection Inject 1 mL (1,000 mcg total) into the muscle every 14 (fourteen) days. 2 mL 0   dicyclomine (BENTYL) 10 MG capsule Take 2 capsules (20 mg total) by mouth every 6 (six) hours as needed for spasms. 60 capsule 1   omeprazole (PRILOSEC) 40 MG capsule Take 1 capsule (40 mg total) by mouth daily. 90 capsule 3   ondansetron (ZOFRAN) 4 MG tablet Take 1 tablet (4 mg total) by mouth every 8 (eight) hours as needed for nausea or vomiting. (Patient not taking: Reported on 01/28/2023) 20 tablet 0   SUMAtriptan (IMITREX) 50 MG tablet Take 1 tablet (50 mg total) by mouth every 2 (two) hours as needed for migraine. May repeat in 2 hours if headache persists or recurs. 10 tablet 11   Syringe/Needle, Disp, (SYRINGE 3CC/21GX1") 21G X 1" 3 ML MISC Inject 1 mL into the muscle every 14 (fourteen) days. Pt to inject 7m every 14 days intramuscular DX  E53.8 48 each 0   venlafaxine XR (EFFEXOR XR) 150 MG 24 hr capsule Take 1 capsule (150 mg total) by mouth daily with breakfast. 90 capsule 1   No current facility-administered medications for this visit.    Allergies  Allergen Reactions   Macrobid [Nitrofurantoin Monohyd Macro] Anaphylaxis   Nitrofurantoin Macrocrystal Shortness Of Breath     REVIEW OF SYSTEMS:  [X]$  denotes positive finding, [ ]$  denotes negative finding Cardiac  Comments:  Chest pain or chest pressure:    Shortness of breath upon exertion:    Short of breath when lying flat:    Irregular heart rhythm:        Vascular    Pain in calf, thigh, or hip brought on by ambulation:    Pain in feet at night that wakes you up from your sleep:     Blood clot in your veins:    Leg swelling:         Pulmonary    Oxygen at home:    Productive cough:     Wheezing:         Neurologic    Sudden weakness in arms or legs:     Sudden  numbness in arms or legs:     Sudden onset of difficulty speaking or slurred speech:    Temporary loss of vision in one eye:     Problems with dizziness:         Gastrointestinal    Blood in stool:     Vomited blood:         Genitourinary  Burning when urinating:     Blood in urine:        Psychiatric    Major depression:         Hematologic    Bleeding problems:    Problems with blood clotting too easily:        Skin    Rashes or ulcers:        Constitutional    Fever or chills:      PHYSICAL EXAMINATION:  There were no vitals filed for this visit.  General:  WDWN in NAD; vital signs documented above Gait: Not observed HENT: WNL, normocephalic Pulmonary: normal non-labored breathing , without Rales, rhonchi,  wheezing Cardiac: regular HR Abdomen: soft, NT, no masses Skin: without rashes Vascular Exam/Pulses:  Right Left  Radial 2+ (normal) 2+ (normal)  Ulnar    Femoral    Popliteal    DP 2+ (normal) 2+ (normal)  PT     Extremities: without ischemic changes, without Gangrene , without cellulitis; without open wounds;  Musculoskeletal: no muscle wasting or atrophy  Neurologic: A&O X 3;  No focal weakness or paresthesias are detected Psychiatric:  The pt has Normal affect.   Non-Invasive Vascular Imaging:    +--------------+---------+------+-----------+------------+--------+  LEFT         Reflux NoRefluxReflux TimeDiameter cmsComments                          Yes                                   +--------------+---------+------+-----------+------------+--------+  CFV          no                                              +--------------+---------+------+-----------+------------+--------+  FV prox       no                                              +--------------+---------+------+-----------+------------+--------+  FV mid        no                                               +--------------+---------+------+-----------+------------+--------+  FV dist       no                                              +--------------+---------+------+-----------+------------+--------+  Popliteal    no                                              +--------------+---------+------+-----------+------------+--------+  GSV at SFJ              yes    >500 ms      0.50              +--------------+---------+------+-----------+------------+--------+  GSV prox thighno                            0.45              +--------------+---------+------+-----------+------------+--------+  GSV mid thigh no                            0.21              +--------------+---------+------+-----------+------------+--------+  GSV dist thighno                            0.25              +--------------+---------+------+-----------+------------+--------+  GSV at knee   no                            0.17              +--------------+---------+------+-----------+------------+--------+  GSV prox calf no                            0.15              +--------------+---------+------+-----------+------------+--------+  SSV Pop Fossa                                       NWV       +--------------+---------+------+-----------+------------+--------+  SSV prox calf                                       NWV       +--------------+---------+------+-----------+------------+--------+         ASSESSMENT/PLAN:: 57 y.o. female presenting with bilateral lower extremity swelling, left greater than right.  Venous duplex ultrasound reviewed demonstrating no significant venous insufficiency.  On physical exam, she has a palpable pulse bilaterally, with no swelling on the dorsal aspect of the foot.  There is no significant edema in the calves. Calves were measured and were symmetric with a circumference of 42 cm.  No varicose veins, no lipodermatosclerosis.   She does have some mild dermal change which looks like livedo reticularis on the left lateral calf, however this has normal capillary refill.  I had a long discussion with Cathy [name] regarding the above.  She does not have ultrasound findings or physical exam findings consistent with chronic venous insufficiency or lymphedema.  I think that the increased size, and heaviness she has appreciated in her calves is due to lipedema and the 50 pound weight gain she is appreciated over the last 2 years.  Symptoms associated with lipedema are best treated with weight loss. Cathy [name] has been working hard to lose weight, but states it has been very difficult now that she is postmenopausal.  Her plan is to see dietitian, and work on a more active lifestyle, which has been difficult due to her husband's health, as he is rather sedentary.  We discussed that compression stockings and elevation can be beneficial and prevent secondary lymphedema from occurring.  I asked Cathy [name] to call my office should any questions or concerns arises I am happy to see her again.  At this  time, she can follow-up as needed.  Broadus John, MD [facility] [phone] Total time of patient care including pre-visit research, consultation, and documentation greater than 45 minutes

## 2023-02-08 ENCOUNTER — Ambulatory Visit: Payer: BC Managed Care – PPO | Admitting: Vascular Surgery

## 2023-02-08 ENCOUNTER — Encounter: Payer: Self-pay | Admitting: Vascular Surgery

## 2023-02-08 VITALS — BP 130/80 | HR 72 | Temp 97.9°F | Resp 20 | Ht 61.0 in | Wt 201.0 lb

## 2023-02-08 DIAGNOSIS — R600 Localized edema: Secondary | ICD-10-CM

## 2023-02-10 DIAGNOSIS — J069 Acute upper respiratory infection, unspecified: Secondary | ICD-10-CM | POA: Diagnosis not present

## 2023-02-12 ENCOUNTER — Encounter: Payer: Self-pay | Admitting: Family Medicine

## 2023-02-12 ENCOUNTER — Ambulatory Visit (INDEPENDENT_AMBULATORY_CARE_PROVIDER_SITE_OTHER): Payer: BC Managed Care – PPO | Admitting: Family Medicine

## 2023-02-12 VITALS — BP 126/83 | HR 74 | Temp 97.4°F | Ht 61.02 in | Wt 200.2 lb

## 2023-02-12 DIAGNOSIS — G44229 Chronic tension-type headache, not intractable: Secondary | ICD-10-CM

## 2023-02-12 DIAGNOSIS — F339 Major depressive disorder, recurrent, unspecified: Secondary | ICD-10-CM | POA: Diagnosis not present

## 2023-02-12 DIAGNOSIS — Z1231 Encounter for screening mammogram for malignant neoplasm of breast: Secondary | ICD-10-CM

## 2023-02-12 DIAGNOSIS — Z13220 Encounter for screening for lipoid disorders: Secondary | ICD-10-CM | POA: Diagnosis not present

## 2023-02-12 DIAGNOSIS — E538 Deficiency of other specified B group vitamins: Secondary | ICD-10-CM

## 2023-02-12 DIAGNOSIS — Z131 Encounter for screening for diabetes mellitus: Secondary | ICD-10-CM

## 2023-02-12 DIAGNOSIS — G4452 New daily persistent headache (NDPH): Secondary | ICD-10-CM | POA: Diagnosis not present

## 2023-02-12 DIAGNOSIS — Z0000 Encounter for general adult medical examination without abnormal findings: Secondary | ICD-10-CM | POA: Diagnosis not present

## 2023-02-12 DIAGNOSIS — Z1211 Encounter for screening for malignant neoplasm of colon: Secondary | ICD-10-CM | POA: Diagnosis not present

## 2023-02-12 LAB — LIPID PANEL
Cholesterol: 249 mg/dL — ABNORMAL HIGH (ref 0–200)
HDL: 65.6 mg/dL (ref >39.00)
LDL Cholesterol: 161 mg/dL — ABNORMAL HIGH (ref 0–99)
NonHDL: 182.95
Total CHOL/HDL Ratio: 4
Triglycerides: 111 mg/dL (ref 0.0–149.0)
VLDL: 22.2 mg/dL (ref 0.0–40.0)

## 2023-02-12 LAB — COMPREHENSIVE METABOLIC PANEL
ALT: 19 U/L (ref 0–35)
AST: 15 U/L (ref 0–37)
Albumin: 4.3 g/dL (ref 3.5–5.2)
Alkaline Phosphatase: 83 U/L (ref 39–117)
BUN: 14 mg/dL (ref 6–23)
CO2: 30 mEq/L (ref 19–32)
Calcium: 10 mg/dL (ref 8.4–10.5)
Chloride: 102 mEq/L (ref 96–112)
Creatinine, Ser: 0.87 mg/dL (ref 0.40–1.20)
GFR: 74.89 mL/min (ref >60.00)
Glucose, Bld: 99 mg/dL (ref 70–99)
Potassium: 4 mEq/L (ref 3.5–5.1)
Sodium: 141 mEq/L (ref 135–145)
Total Bilirubin: 0.3 mg/dL (ref 0.2–1.2)
Total Protein: 7.1 g/dL (ref 6.0–8.3)

## 2023-02-12 LAB — CBC WITH DIFFERENTIAL/PLATELET
Basophils Absolute: 0 10*3/uL (ref 0.0–0.1)
Basophils Relative: 0.3 % (ref 0.0–3.0)
Eosinophils Absolute: 0 10*3/uL (ref 0.0–0.7)
Eosinophils Relative: 0.1 % (ref 0.0–5.0)
HCT: 43.2 % (ref 36.0–46.0)
Hemoglobin: 14 g/dL (ref 12.0–15.0)
Lymphocytes Relative: 12.1 % (ref 12.0–46.0)
Lymphs Abs: 1.8 10*3/uL (ref 0.7–4.0)
MCHC: 32.5 g/dL (ref 30.0–36.0)
MCV: 92.6 fl (ref 78.0–100.0)
Monocytes Absolute: 0.8 10*3/uL (ref 0.1–1.0)
Monocytes Relative: 5.5 % (ref 3.0–12.0)
Neutro Abs: 11.9 10*3/uL — ABNORMAL HIGH (ref 1.4–7.7)
Neutrophils Relative %: 82 % — ABNORMAL HIGH (ref 43.0–77.0)
Platelets: 325 10*3/uL (ref 150.0–400.0)
RBC: 4.66 Mil/uL (ref 3.87–5.11)
RDW: 14.7 % (ref 11.5–15.5)
WBC: 14.5 10*3/uL — ABNORMAL HIGH (ref 4.0–10.5)

## 2023-02-12 LAB — HEMOGLOBIN A1C: Hgb A1c MFr Bld: 6 % (ref 4.6–6.5)

## 2023-02-12 LAB — VITAMIN B12: Vitamin B-12: 354 pg/mL (ref 211–911)

## 2023-02-12 LAB — TSH: TSH: 2 u[IU]/mL (ref 0.35–5.50)

## 2023-02-12 MED ORDER — SUMATRIPTAN SUCCINATE 50 MG PO TABS: 50.0000 mg | ORAL_TABLET | ORAL | 11 refills | Status: DC | PRN

## 2023-02-12 MED ORDER — CYANOCOBALAMIN 1000 MCG/ML IJ SOLN: 1000.0000 ug | INTRAMUSCULAR | 3 refills | Status: DC

## 2023-02-12 MED ORDER — VENLAFAXINE HCL ER 150 MG PO CP24: 150.0000 mg | ORAL_CAPSULE | Freq: Every day | ORAL | 1 refills | Status: DC

## 2023-02-12 NOTE — Progress Notes (Signed)
.    Patient ID: Cathy [name], female  DOB: June 20, 1966, 57 y.o.   MRN: [id number] Patient Care Team    Relationship Specialty Notifications Start End  Ma Hillock, DO PCP - General Family Medicine  10/07/17   Lorretta Harp, MD Consulting Physician Cardiology  09/27/17   Paula Compton, MD Consulting Physician Obstetrics and Gynecology  09/27/17   Daryel November, MD Consulting Physician Gastroenterology  02/12/23     Chief Complaint  Patient presents with   Annual Exam    [place]; Pt not fasting    Subjective: Cathy [name] is a 57 y.o.  Female  present for CPE All past medical history, surgical history, allergies, family history, immunizations, medications and social history were updated in the electronic medical record today. All recent labs, ED visits and hospitalizations within the last year were reviewed.  Health maintenance:  Colonoscopy: 04/18/2022- 3 yr follow up , Dr. Candis Schatz Mammogram: completed: 2023- GYN Cervical cancer screening: last pelvic: 01/2022,  by: GYN. Dr Marvel Plan Immunizations: tdap UTD 2017, Influenza declined(encouraged yearly), shingrix declined Infectious disease screening: HIV completed. Hep C cpmpleted today DEXA: screen ~60 Assistive device: none Oxygen [id number] Patient has a Dental home. Hospitalizations/ED visits: reviewed  Depression/anxiety: Patient reports she is feeling well since starting back on the Effexor. Prior note: Pt presents for an OV with complaints of sinus, fatigue, headache and heartburn.  She experienced similar symptoms over the past years when her anxiety had increased.  She reports today she is not taking her B12 or Effexor any longer.  She is using the Imitrex when needed for her migraines.  She is under very stressful time in her life and has recently switched jobs.  She reports she had her eyes checked and did get new glasses, she does not feel this made a difference in her symptoms.  She endorses both vertigo-like  symptoms and lightheadedness.  She does not drink much fluid in the day.  She reports she had recent labs completed at her gynecologist and was told they were normal, including her B12. Prior note: Effexor 75 mg daily had been prescribed in the past and patient had reported it was very helpful with both her depression, anxiety and headaches  Chronic headache: Patient reports compliance with B12 injections every 2 weeks.  She is using the Vistaril as needed.   Prior note: Patient has not been taking her B12.  She had reported great improvement in her headaches when taking the B12.  She has had extensive laboratory work-up for these in the past, with normal findings except for lower and B12. Prior note: She reports her headaches have returned approximately 2 to 3 weeks.  She reports it is the same doll mild headache and over the weekend the sharp shooting pain through the top of her head with numbness and tingling towards her nose reoccurred.  She reported that her headaches were completely improved at the time we were doing B12 injections.  Injections stopped 03/12/2019 and her levels were in normal range.  Reports she has been taking oral B12. CBC, CMP, TSH, magnesium, ANA-all normal. B12 was approximately 150 prior to the start of injections.  Letter than 700 with every 14-day B12 injection. Vitamin D mildly low at 27, now supplementing Prior note: Pt presents for an OV with complaints of headache of 2 weeks duration.  SHe was seen by another provider on December 23 for this condition, that started Dec. 20th and encouraged to report to the  emergency room for alarming symptoms with her headache.  She states she did not report to the emergency room as instructed 2/2 financial limitations.  Presents today secondary to her headache has yet to resolve.  Associated symptoms with her headache has been vomiting initially, which has resolved-but nausea remains.  Watery diarrhea occurring daily, worse when she  eats that has been present since onset of headache- but absent for last 18 hours.  SHe reports the actual head pain has improved a fraction since being seen on the 23rd it was a "constant pounding pain" but has remained a constant pressure pain around her eyes and occipital area.  Reports pain in bilateral ears and nausea remains.  She endorses feeling mildly lightheaded or "foggy "   She has had a history of migraines that used to occur frequently many years ago.  She states she has not really had problems with her migraines in the last decade.  Used to take Imitrex when she did have migraines.  Currently she has been taking NSAIDs and Excedrin Migraine to help with her headaches.  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  Normal MRI in 2002-completed secondary to headaches and history of right retinal toxoplasmosis She denies fevers or chills during the last 2 weeks.  SHe denies illness prior to onset of headache.  She denies injury or head trauma.  She denies slurred speech, difficulty swallowing, ringing of her ears, visual changes, unilateral muscle weakness, unsteady gait.  She states that the headache and symptoms started abruptly.  No exposure to sick contacts, under prepared food, antibiotics or travel outside the [place].     02/12/2023    9:36 AM 01/14/2023    1:31 PM 07/17/2022    9:52 AM 02/08/2022    9:21 AM 12/14/2021   10:40 AM  Depression screen PHQ 2/9  Decreased Interest 2 0 '1 1 2  '$ Down, Depressed, Hopeless 2 0 '1 1 1  '$ PHQ - 2 Score 4 0 '2 2 3  '$ Altered sleeping '2  2 2 2  '$ Tired, decreased energy '2  3 2 2  '$ Change in appetite 0  '3 3 3  '$ Feeling bad or failure about yourself  '1  1 1 1  '$ Trouble concentrating '1  2 1 2  '$ Moving slowly or fidgety/restless 1  1 0 1  Suicidal thoughts 0  1 0 0  PHQ-9 Score '11  15 11 14      '$ 07/17/2022    9:56 AM 02/08/2022    9:22 AM 12/14/2021   10:40 AM 03/02/2019    2:19 PM  GAD 7 : Generalized  Anxiety Score  Nervous, Anxious, on Edge '2 2 3 3  '$ Control/stop worrying '2 1 3 3  '$ Worry too much - different things '2 2 3 3  '$ Trouble relaxing '3 3 2 3  '$ Restless '3 2 2 3  '$ Easily annoyed or irritable '1 1 2 3  '$ Afraid - awful might happen '1 1 2 1  '$ Total GAD 7 Score '14 12 17 19  '$ Anxiety Difficulty    Very difficult    Immunization History  Administered Date(s) Administered   Influenza,inj,Quad PF,6+ Mos 08/26/2018   Influenza-Unspecified 11/15/2014   Tdap 10/12/2010, 04/12/2016    Past Medical History:  Diagnosis Date   Anemia    Colon polyp    Depression    Dyspnea on exertion    With atypical chest pain, followed by cardiology.  Genital warts 0000000   TCA application by GYn   History of frequent urinary tract infections    Ileitis    Juvenile epilepsy ([place])    Migraines    Seizures ([place])    none since 1987- classified as epileptic seizures   Tendonitis of ankle or foot 05/2022   right   Thrombosed external hemorrhoid    Allergies  Allergen Reactions   Macrobid [Nitrofurantoin Monohyd Macro] Anaphylaxis   Nitrofurantoin Macrocrystal Shortness Of Breath   Past Surgical History:  Procedure Laterality Date   BILATERAL SALPINGECTOMY Bilateral 12/20/2014   Procedure: BILATERAL SALPINGECTOMY;  Surgeon: Logan Bores, MD;  Location: [facility];  Service: Gynecology;  Laterality: Bilateral;   COLONOSCOPY  03/2022   endocolon   HEMORRHOID SURGERY     thrombosed hemorrhoid   LAPAROSCOPIC ASSISTED VAGINAL HYSTERECTOMY N/A 12/20/2014   Procedure: LAPAROSCOPIC ASSISTED VAGINAL HYSTERECTOMY;  Surgeon: Logan Bores, MD;  Location: [facility];  Service: Gynecology;  Laterality: N/A;   tendon repair Right 12/24/2009   right ankle   TONSILLECTOMY  12/25/1971   UPPER GASTROINTESTINAL ENDOSCOPY     Family History  Problem Relation Age of Onset   Hyperlipidemia Mother    Heart disease Mother    Hypertension Mother    Ovarian cancer Mother    Cervical cancer Mother    Heart  attack Mother    Depression Sister    Asthma Sister    Dementia Maternal Grandmother    Heart attack Maternal Grandmother    Heart attack Maternal Grandfather    Heart attack Paternal Grandmother    Asthma Son    Colon cancer Neg Hx    Pancreatic cancer Neg Hx    Liver cancer Neg Hx    Esophageal cancer Neg Hx    Social History   Social History Narrative   Divorced. 3 children. Bachelors degree. Works as an Optometrist.   Exercises routinely.   Wears her seatbelt. Wears a bicycle helmet. Smoke detector in the home.   Feels safe in her relationships.    Allergies as of 02/12/2023       Reactions   Macrobid [nitrofurantoin Monohyd Macro] Anaphylaxis   Nitrofurantoin Macrocrystal Shortness Of Breath        Medication List        Accurate as of February 12, 2023  9:38 AM. If you have any questions, ask your nurse or doctor.          STOP taking these medications    ondansetron 4 MG tablet Commonly known as: ZOFRAN Stopped by: Howard Pouch, DO       TAKE these medications    cyanocobalamin 1000 MCG/ML injection Commonly known as: VITAMIN B12 Inject 1 mL (1,000 mcg total) into the muscle every 14 (fourteen) days.   dicyclomine 10 MG capsule Commonly known as: BENTYL Take 2 capsules (20 mg total) by mouth every 6 (six) hours as needed for spasms.   omeprazole 40 MG capsule Commonly known as: PRILOSEC Take 1 capsule (40 mg total) by mouth daily.   SUMAtriptan 50 MG tablet Commonly known as: Imitrex Take 1 tablet (50 mg total) by mouth every 2 (two) hours as needed for migraine. May repeat in 2 hours if headache persists or recurs.   SYRINGE 3CC/21GX1" 21G X 1" 3 ML Misc Inject 1 mL into the muscle every 14 (fourteen) days. Pt to inject 46m every 14 days intramuscular DX  E53.8   venlafaxine XR 150 MG 24 hr capsule Commonly known as:  Effexor XR Take 1 capsule (150 mg total) by mouth daily with breakfast.        All past medical history, surgical  history, allergies, family history, immunizations andmedications were updated in the EMR today and reviewed under the history and medication portions of their EMR.     No results found for this or any previous visit (from the past 2160 hour(s)).   ROS 14 pt review of systems performed and negative (unless mentioned in an HPI)  Objective: BP 126/83   Pulse 74   Temp (!) 97.4 F (36.3 C)   Ht 5' 1.02" (1.55 m)   Wt 200 lb 3.2 oz (90.8 kg)   LMP 11/19/2014   SpO2 99%   BMI 37.80 kg/m  Physical Exam Vitals and nursing note reviewed.  Constitutional:      General: She is not in acute distress.    Appearance: Normal appearance. She is obese. She is not ill-appearing or toxic-appearing.  HENT:     Head: Normocephalic and atraumatic.     Right Ear: Tympanic membrane, ear canal and external ear normal. There is no impacted cerumen.     Left Ear: Tympanic membrane, ear canal and external ear normal. There is no impacted cerumen.     Nose: No congestion or rhinorrhea.     Mouth/Throat:     Mouth: Mucous membranes are moist.     Pharynx: Oropharynx is clear. No oropharyngeal exudate or posterior oropharyngeal erythema.  Eyes:     General: No scleral icterus.       Right eye: No discharge.        Left eye: No discharge.     Extraocular Movements: Extraocular movements intact.     Conjunctiva/sclera: Conjunctivae normal.     Pupils: Pupils are equal, round, and reactive to light.  Cardiovascular:     Rate and Rhythm: Normal rate and regular rhythm.     Pulses: Normal pulses.     Heart sounds: Normal heart sounds. No murmur heard.    No friction rub. No gallop.  Pulmonary:     Effort: Pulmonary effort is normal. No respiratory distress.     Breath sounds: Normal breath sounds. No stridor. No wheezing, rhonchi or rales.  Chest:     Chest wall: No tenderness.  Abdominal:     General: Abdomen is flat. Bowel sounds are normal. There is no distension.     Palpations: Abdomen is soft.  There is no mass.     Tenderness: There is no abdominal tenderness. There is no right CVA tenderness, left CVA tenderness, guarding or rebound.     Hernia: No hernia is present.  Musculoskeletal:        General: No swelling, tenderness or deformity. Normal range of motion.     Cervical back: Normal range of motion and neck supple. No rigidity or tenderness.     Right lower leg: No edema.     Left lower leg: No edema.  Lymphadenopathy:     Cervical: No cervical adenopathy.  Skin:    General: Skin is warm and dry.     Coloration: Skin is not jaundiced or pale.     Findings: No bruising, erythema, lesion or rash.  Neurological:     General: No focal deficit present.     Mental Status: She is alert and oriented to person, place, and time. Mental status is at baseline.     Cranial Nerves: No cranial nerve deficit.     Sensory: No sensory  deficit.     Motor: No weakness.     Coordination: Coordination normal.     Gait: Gait normal.     Deep Tendon Reflexes: Reflexes normal.  Psychiatric:        Mood and Affect: Mood normal.        Behavior: Behavior normal.        Thought Content: Thought content normal.        Judgment: Judgment normal.      No results found.  Assessment/plan: PREETHI LIGHTFOOT is a 57 y.o. female present for CPE and routine chronic condition appointment Nonintractable headache, unspecified chronicity pattern, unspecified headache type/fatigue/B12 -improved -Continue B12 injections every 2 weeks.  Patient self injects. - CBC, Comp Met (CMET, Magnesium,TSH, ANA--> Normal and prior headache work-ups. -Continue  Imitrex as needed  Depression, recurrent ([place]) - much improved since starting back on effexor - tsh collected today -continue  Effexor 150 mg daily- -Follow-up 5.5 mos  Family history of ovarian cancer Follows with gyn Routine general medical examination at a health care facility Patient was encouraged to exercise greater than 150 minutes a week. Patient  was encouraged to choose a diet filled with fresh fruits and vegetables, and lean meats. AVS provided to patient today for education/recommendation on gender specific health and safety maintenance. Colonoscopy: 04/18/2022- 3 yr follow up , Dr. Candis Schatz Mammogram: completed: 2023- GYN Cervical cancer screening: last pelvic: 01/2022,  by: GYN. Dr Marvel Plan Immunizations: tdap UTD 2017, Influenza declined(encouraged yearly), shingrix declined Infectious disease screening: HIV completed. Hep C cpmpleted today DEXA: screen ~60  Return in about 24 weeks (around 07/30/2023) for Routine chronic condition follow-up.    Orders Placed This Encounter  Procedures   CBC with Differential/Platelet   Hemoglobin A1c   Comprehensive metabolic panel   Lipid panel   TSH   B12   Meds ordered this encounter  Medications   cyanocobalamin (VITAMIN B12) 1000 MCG/ML injection    Sig: Inject 1 mL (1,000 mcg total) into the muscle every 14 (fourteen) days.    Dispense:  10 mL    Refill:  3    Please include needle/syringe. Thanks   venlafaxine XR (EFFEXOR XR) 150 MG 24 hr capsule    Sig: Take 1 capsule (150 mg total) by mouth daily with breakfast.    Dispense:  90 capsule    Refill:  1   SUMAtriptan (IMITREX) 50 MG tablet    Sig: Take 1 tablet (50 mg total) by mouth every 2 (two) hours as needed for migraine. May repeat in 2 hours if headache persists or recurs.    Dispense:  10 tablet    Refill:  11   Referral Orders  No referral(s) requested today     Electronically signed by: Howard Pouch, [place]

## 2023-02-12 NOTE — Patient Instructions (Signed)
No follow-ups on file.        Great to see you today.  I have refilled the medication(s) we provide.   If labs were collected, we will inform you of lab results once received either by echart message or telephone call.   - echart message- for normal results that have been seen by the patient already.   - telephone call: abnormal results or if patient has not viewed results in their echart.  

## 2023-02-13 ENCOUNTER — Telehealth: Payer: Self-pay | Admitting: Family Medicine

## 2023-02-13 DIAGNOSIS — D72825 Bandemia: Secondary | ICD-10-CM | POA: Insufficient documentation

## 2023-02-13 HISTORY — DX: Bandemia: D72.825

## 2023-02-13 NOTE — Telephone Encounter (Signed)
Please call patient Liver, kidney and thyroid function are normal. A1c is elevated in the prediabetic range at 6.0, but her fasting glucose is normal which is reassuring.  No medications required at this time. B12 levels are 354, I would encourage her to continue taking the B12 injection every 14 days.  That was refilled for her. Her bad cholesterol/LDL is higher than desired at 161.  The ratio of her bad to good cholesterol is still okay.  Goal LDL would be less than 135.  Routine exercise, high-fiber diet would be encouraged.  Since her LDL has been elevated for a few years, I would encourage start of prescribed medication at a lower dose to help bring her LDL down below 135.  If she is agreeable to add a medication for her cholesterol which would add cardiovascular protection, I will call this in today for her. She has a mildly elevated white count that looks to be an infectious pattern.  I would recommend we repeat these by lab appointment only in 2 weeks to ensure they return to normal.  If they do not show stabilization, then we would need to pursue because of elevation.  Lab appointment only 2 weeks for repeat CBC

## 2023-02-13 NOTE — Telephone Encounter (Signed)
Spoke with patient regarding results/recommendations.  

## 2023-02-27 ENCOUNTER — Other Ambulatory Visit (INDEPENDENT_AMBULATORY_CARE_PROVIDER_SITE_OTHER): Payer: BC Managed Care – PPO

## 2023-02-27 DIAGNOSIS — D72825 Bandemia: Secondary | ICD-10-CM | POA: Diagnosis not present

## 2023-02-27 LAB — CBC WITH DIFFERENTIAL/PLATELET
Basophils Absolute: 0.1 10*3/uL (ref 0.0–0.1)
Basophils Relative: 0.9 % (ref 0.0–3.0)
Eosinophils Absolute: 0.1 10*3/uL (ref 0.0–0.7)
Eosinophils Relative: 1.9 % (ref 0.0–5.0)
HCT: 41.2 % (ref 36.0–46.0)
Hemoglobin: 13.6 g/dL (ref 12.0–15.0)
Lymphocytes Relative: 25.4 % (ref 12.0–46.0)
Lymphs Abs: 1.8 10*3/uL (ref 0.7–4.0)
MCHC: 32.9 g/dL (ref 30.0–36.0)
MCV: 93 fl (ref 78.0–100.0)
Monocytes Absolute: 0.5 10*3/uL (ref 0.1–1.0)
Monocytes Relative: 7.3 % (ref 3.0–12.0)
Neutro Abs: 4.6 10*3/uL (ref 1.4–7.7)
Neutrophils Relative %: 64.5 % (ref 43.0–77.0)
Platelets: 252 10*3/uL (ref 150.0–400.0)
RBC: 4.43 Mil/uL (ref 3.87–5.11)
RDW: 14.9 % (ref 11.5–15.5)
WBC: 7.1 10*3/uL (ref 4.0–10.5)

## 2023-04-23 ENCOUNTER — Other Ambulatory Visit: Payer: Self-pay | Admitting: Vascular Surgery

## 2023-04-23 DIAGNOSIS — M79605 Pain in left leg: Secondary | ICD-10-CM

## 2023-05-15 ENCOUNTER — Other Ambulatory Visit: Payer: Self-pay | Admitting: Gastroenterology

## 2023-06-03 DIAGNOSIS — Z01411 Encounter for gynecological examination (general) (routine) with abnormal findings: Secondary | ICD-10-CM | POA: Diagnosis not present

## 2023-06-03 DIAGNOSIS — Z1321 Encounter for screening for nutritional disorder: Secondary | ICD-10-CM | POA: Diagnosis not present

## 2023-06-03 DIAGNOSIS — Z130 Encounter for screening for diseases of the blood and blood-forming organs and certain disorders involving the immune mechanism: Secondary | ICD-10-CM | POA: Diagnosis not present

## 2023-06-03 DIAGNOSIS — E559 Vitamin D deficiency, unspecified: Secondary | ICD-10-CM | POA: Diagnosis not present

## 2023-06-03 DIAGNOSIS — N951 Menopausal and female climacteric states: Secondary | ICD-10-CM | POA: Diagnosis not present

## 2023-06-03 DIAGNOSIS — N83201 Unspecified ovarian cyst, right side: Secondary | ICD-10-CM | POA: Diagnosis not present

## 2023-06-03 DIAGNOSIS — Z1231 Encounter for screening mammogram for malignant neoplasm of breast: Secondary | ICD-10-CM | POA: Diagnosis not present

## 2023-06-03 DIAGNOSIS — Z13220 Encounter for screening for lipoid disorders: Secondary | ICD-10-CM | POA: Diagnosis not present

## 2023-06-03 DIAGNOSIS — Z13228 Encounter for screening for other metabolic disorders: Secondary | ICD-10-CM | POA: Diagnosis not present

## 2023-06-03 LAB — HM MAMMOGRAPHY

## 2023-06-06 DIAGNOSIS — H9203 Otalgia, bilateral: Secondary | ICD-10-CM | POA: Diagnosis not present

## 2023-06-06 DIAGNOSIS — R051 Acute cough: Secondary | ICD-10-CM | POA: Diagnosis not present

## 2023-06-06 DIAGNOSIS — J209 Acute bronchitis, unspecified: Secondary | ICD-10-CM | POA: Diagnosis not present

## 2023-06-06 DIAGNOSIS — J069 Acute upper respiratory infection, unspecified: Secondary | ICD-10-CM | POA: Diagnosis not present

## 2023-07-08 DIAGNOSIS — N938 Other specified abnormal uterine and vaginal bleeding: Secondary | ICD-10-CM | POA: Diagnosis not present

## 2023-07-30 ENCOUNTER — Encounter: Payer: Self-pay | Admitting: Family Medicine

## 2023-07-30 ENCOUNTER — Ambulatory Visit: Payer: BC Managed Care – PPO | Admitting: Family Medicine

## 2023-07-30 VITALS — BP 109/73 | HR 78 | Temp 97.7°F | Ht 61.0 in | Wt 200.6 lb

## 2023-07-30 DIAGNOSIS — E785 Hyperlipidemia, unspecified: Secondary | ICD-10-CM | POA: Diagnosis not present

## 2023-07-30 DIAGNOSIS — G44229 Chronic tension-type headache, not intractable: Secondary | ICD-10-CM | POA: Diagnosis not present

## 2023-07-30 DIAGNOSIS — R7303 Prediabetes: Secondary | ICD-10-CM

## 2023-07-30 DIAGNOSIS — K625 Hemorrhage of anus and rectum: Secondary | ICD-10-CM

## 2023-07-30 DIAGNOSIS — E538 Deficiency of other specified B group vitamins: Secondary | ICD-10-CM | POA: Diagnosis not present

## 2023-07-30 DIAGNOSIS — E559 Vitamin D deficiency, unspecified: Secondary | ICD-10-CM | POA: Diagnosis not present

## 2023-07-30 DIAGNOSIS — F339 Major depressive disorder, recurrent, unspecified: Secondary | ICD-10-CM | POA: Diagnosis not present

## 2023-07-30 HISTORY — DX: Hemorrhage of anus and rectum: K62.5

## 2023-07-30 MED ORDER — VENLAFAXINE HCL ER 150 MG PO CP24: 150.0000 mg | ORAL_CAPSULE | Freq: Every day | ORAL | 1 refills | Status: DC

## 2023-07-30 NOTE — Progress Notes (Signed)
.    Patient ID: Cathy [name], female  DOB: 1966/08/04, 57 y.o.   MRN: [id number] Patient Care Team    Relationship Specialty Notifications Start End  Natalia Leatherwood, DO PCP - General Family Medicine  10/07/17   Runell Gess, MD Consulting Physician Cardiology  09/27/17   Huel Cote, MD Consulting Physician Obstetrics and Gynecology  09/27/17   Jenel Lucks, MD Consulting Physician Gastroenterology  02/12/23     Chief Complaint  Patient presents with   Depression    Subjective: Cathy [name] is a 57 y.o.  Female  present for  Chronic Conditions/illness Management All past medical history, surgical history, allergies, family history, immunizations, medications and social history were updated in the electronic medical record today. All recent labs, ED visits and hospitalizations within the last year were reviewed.  Depression/anxiety: Patient reports she is feeling well since starting back on the Effexor.  150 mg daily. Prior note: Pt presents for an OV with complaints of sinus, fatigue, headache and heartburn.  She experienced similar symptoms over the past years when her anxiety had increased.  She reports today she is not taking her B12 or Effexor any longer.  She is using the Imitrex when needed for her migraines.  She is under very stressful time in her life and has recently switched jobs.  She reports she had her eyes checked and did get new glasses, she does not feel this made a difference in her symptoms.  She endorses both vertigo-like symptoms and lightheadedness.  She does not drink much fluid in the day.  She reports she had recent labs completed at her gynecologist and was told they were normal, including her B12. Prior note: Effexor 75 mg daily had been prescribed in the past and patient had reported it was very helpful with both her depression, anxiety and headaches  Chronic headache: Patient reports compliance with B12 injections every 2 weeks.  She is using  the Vistaril and Imitrex as needed.  She reports she has had more headache days over the last couple months, about 10 headache days a month.  Rectal bleeding and vaginal bleeding. She is following with gastroenterology and gynecology for the above issues.      07/30/2023    8:47 AM 02/12/2023    9:36 AM 01/14/2023    1:31 PM 07/17/2022    9:52 AM 02/08/2022    9:21 AM  Depression screen PHQ 2/9  Decreased Interest 1 2 0 1 1  Down, Depressed, Hopeless 1 2 0 1 1  PHQ - 2 Score 2 4 0 2 2  Altered sleeping 2 2  2 2   Tired, decreased energy 2 2  3 2   Change in appetite 3 0  3 3  Feeling bad or failure about yourself  2 1  1 1   Trouble concentrating 2 1  2 1   Moving slowly or fidgety/restless 1 1  1  0  Suicidal thoughts 0 0  1 0  PHQ-9 Score 14 11  15 11   Difficult doing work/chores Somewhat difficult          07/30/2023    8:47 AM 02/12/2023    9:44 AM 07/17/2022    9:56 AM 02/08/2022    9:22 AM  GAD 7 : Generalized Anxiety Score  Nervous, Anxious, on Edge 2 2 2 2   Control/stop worrying 2 2 2 1   Worry too much - different things 2 2 2 2   Trouble relaxing 2 2 3  3  Restless 2 2 3 2   Easily annoyed or irritable 1 2 1 1   Afraid - awful might happen 0 0 1 1  Total GAD 7 Score 11 12 14 12   Anxiety Difficulty Somewhat difficult       Immunization History  Administered Date(s) Administered   Influenza,inj,Quad PF,6+ Mos 08/26/2018   Influenza-Unspecified 11/15/2014   Tdap 10/12/2010, 04/12/2016    Past Medical History:  Diagnosis Date   Anemia    Colon polyp    Depression    Dyspnea on exertion    With atypical chest pain, followed by cardiology.   Genital warts 06/2016   TCA application by GYn   History of frequent urinary tract infections    Ileitis    Juvenile epilepsy (HCC)    Migraines    Seizures (HCC)    none since 1987- classified as epileptic seizures   Tendonitis of ankle or foot 05/2022   right   Thrombosed external hemorrhoid    Allergies  Allergen  Reactions   Macrobid [Nitrofurantoin Monohyd Macro] Anaphylaxis   Nitrofurantoin Macrocrystal Shortness Of Breath   Past Surgical History:  Procedure Laterality Date   BILATERAL SALPINGECTOMY Bilateral 12/20/2014   Procedure: BILATERAL SALPINGECTOMY;  Surgeon: Oliver Pila, MD;  Location: [facility];  Service: Gynecology;  Laterality: Bilateral;   COLONOSCOPY  03/2022   endocolon   HEMORRHOID SURGERY     thrombosed hemorrhoid   LAPAROSCOPIC ASSISTED VAGINAL HYSTERECTOMY N/A 12/20/2014   Procedure: LAPAROSCOPIC ASSISTED VAGINAL HYSTERECTOMY;  Surgeon: Oliver Pila, MD;  Location: [facility];  Service: Gynecology;  Laterality: N/A;   tendon repair Right 12/24/2009   right ankle   TONSILLECTOMY  12/25/1971   UPPER GASTROINTESTINAL ENDOSCOPY     Family History  Problem Relation Age of Onset   Hyperlipidemia Mother    Heart disease Mother    Hypertension Mother    Ovarian cancer Mother    Cervical cancer Mother    Heart attack Mother    Depression Sister    Asthma Sister    Dementia Maternal Grandmother    Heart attack Maternal Grandmother    Heart attack Maternal Grandfather    Heart attack Paternal Grandmother    Asthma Son    Colon cancer Neg Hx    Pancreatic cancer Neg Hx    Liver cancer Neg Hx    Esophageal cancer Neg Hx    Social History   Social History Narrative   Divorced. 3 children. Bachelors degree. Works as an Airline pilot.   Exercises routinely.   Wears her seatbelt. Wears a bicycle helmet. Smoke detector in the home.   Feels safe in her relationships.    Allergies as of 07/30/2023       Reactions   Macrobid [nitrofurantoin Monohyd Macro] Anaphylaxis   Nitrofurantoin Macrocrystal Shortness Of Breath        Medication List        Accurate as of July 30, 2023  3:22 PM. If you have any questions, ask your nurse or doctor.          cyanocobalamin 1000 MCG/ML injection Commonly known as: VITAMIN B12 Inject 1 mL (1,000 mcg total) into the  muscle every 14 (fourteen) days.   dicyclomine 10 MG capsule Commonly known as: BENTYL Take 2 capsules (20 mg total) by mouth every 6 (six) hours as needed for spasms.   omeprazole 40 MG capsule Commonly known as: PRILOSEC TAKE 1 CAPSULE(40 MG) BY MOUTH DAILY   SUMAtriptan 50 MG tablet Commonly known  as: Imitrex Take 1 tablet (50 mg total) by mouth every 2 (two) hours as needed for migraine. May repeat in 2 hours if headache persists or recurs.   SYRINGE 3CC/21GX1" 21G X 1" 3 ML Misc Inject 1 mL into the muscle every 14 (fourteen) days. Pt to inject 1mL every 14 days intramuscular DX  E53.8   venlafaxine XR 150 MG 24 hr capsule Commonly known as: Effexor XR Take 1 capsule (150 mg total) by mouth daily with breakfast.        All past medical history, surgical history, allergies, family history, immunizations andmedications were updated in the EMR today and reviewed under the history and medication portions of their EMR.     No results found for this or any previous visit (from the past 2160 hour(s)).   ROS 14 pt review of systems performed and negative (unless mentioned in an HPI)  Objective: BP 109/73   Pulse 78   Temp 97.7 F (36.5 C) (Oral)   Ht 5\' 1"  (1.549 m)   Wt 200 lb 9.6 oz (91 kg)   LMP 11/19/2014   SpO2 95%   BMI 37.90 kg/m  Physical Exam Vitals and nursing note reviewed.  Constitutional:      General: She is not in acute distress.    Appearance: Normal appearance. She is normal weight. She is not ill-appearing or toxic-appearing.  HENT:     Head: Normocephalic and atraumatic.  Eyes:     General: No scleral icterus.       Right eye: No discharge.        Left eye: No discharge.     Extraocular Movements: Extraocular movements intact.     Conjunctiva/sclera: Conjunctivae normal.     Pupils: Pupils are equal, round, and reactive to light.  Cardiovascular:     Rate and Rhythm: Normal rate and regular rhythm.     Heart sounds: No murmur heard. Skin:     Findings: No rash.  Neurological:     Mental Status: She is alert and oriented to person, place, and time. Mental status is at baseline.     Motor: No weakness.     Coordination: Coordination normal.     Gait: Gait normal.  Psychiatric:        Mood and Affect: Mood normal.        Behavior: Behavior normal.        Thought Content: Thought content normal.        Judgment: Judgment normal.      No results found.  Assessment/plan: Cathy [name] is a 57 y.o. female present for chronic condition appointment Nonintractable headache, unspecified chronicity pattern, unspecified headache type/fatigue/B12 -More frequent headaches.  Possibly related to her blood loss/anemia causing iron deficiency.   Iron panel collected today along with CBC. -Continue B12 injections every 2 weeks.  Patient self injects. - CBC, Comp Met (CMET, Magnesium,TSH, ANA--> Normal and prior headache work-ups. -continue imitrex as needed -Encouraged her to continue the B12 supplement and add magnesium 400 mg before bed. -She declines referral to neurology or preventative med for migraines today.  Depression, recurrent (HCC) - stable - continue  Effexor 150 mg daily    Return in about 7 months (around 02/14/2024) for cpe (20 min), Routine chronic condition follow-up.    Orders Placed This Encounter  Procedures   Direct LDL   Hemoglobin A1c   CBC w/Diff   IBC + Ferritin   Vitamin D (25 hydroxy)   Meds ordered this encounter  Medications   venlafaxine XR (EFFEXOR XR) 150 MG 24 hr capsule    Sig: Take 1 capsule (150 mg total) by mouth daily with breakfast.    Dispense:  90 capsule    Refill:  1   Referral Orders  No referral(s) requested today     Electronically signed by: Felix Pacini, DO [facility] [place]

## 2023-07-30 NOTE — Patient Instructions (Addendum)
Return in about 7 months (around 02/14/2024) for cpe (20 min), Routine chronic condition follow-up.        Great to see you today.  I have refilled the medication(s) we provide.   If labs were collected or images ordered, we will inform you of  results once we have received them and reviewed. We will contact you either by echart message, or telephone call.  Please give ample time to the testing facility, and our office to run,  receive and review results. Please do not call inquiring of results, even if you can see them in your chart. We will contact you as soon as we are able. If it has been over 1 week since the test was completed, and you have not yet heard from [place], then please call us.    - echart message- for normal results that have been seen by the patient already.   - telephone call: abnormal results or if patient has not viewed results in their echart.  If a referral to a specialist was entered for you, please call us in 2 weeks if you have not heard from the specialist office to schedule.    If you are interested in weight loss counseling please make appt to discuss and bring with you 2 weeks of a food diary/log on notebook paper.  Food log is mandatory on first appt to proceed with counseling.  Weight loss counseling encompasses diet, exercise and can include  medications when appropriate and affordable.  There are routine appts for check-ins and weights to track progress and keep you on track.  Routine check-ins (in person) are also mandatory to continue with prescription refills. Check-in timeline  can range from 4 weeks to 12 weeks, depending on physician's recommendations and which step you are in of your weight loss journey.  Your BMI today is Body mass index is 37.9 kg/m.  Please check with your insurance prior to appt and ask them if they cover weight loss medications for your BMI? And if so, which medications. They may tell you some of the diabetes meds that are used  for weight loss also,  are on your formulary- but this does not mean they are covered for weight loss only.  Even if they tell with a prior auth it is covered- make sure they check to see if you personally meet criteria with your BMI.

## 2023-07-31 ENCOUNTER — Telehealth: Payer: Self-pay | Admitting: Family Medicine

## 2023-07-31 NOTE — Telephone Encounter (Signed)
Please call patient Cathy [name], Cathy [name], Cathy [name], Cathy [name], Cathy [name].  Iron saturations are at the Cathy end of normal at 136. Vitamin D levels are normal

## 2023-07-31 NOTE — Telephone Encounter (Signed)
Spoke with patient regarding results/recommendations.  

## 2023-08-02 ENCOUNTER — Telehealth: Payer: Self-pay | Admitting: Gastroenterology

## 2023-08-02 NOTE — Telephone Encounter (Signed)
Pt states she has been passing blood from her rectum. States today she went to the bathroom and passed a lot of blood, described it as she was peeing out of her rectum. Discussed with pt that this is not normal and if she is passing this much blood she needs to go to the ER. She knows to go to the ER for evaluation.

## 2023-08-02 NOTE — Telephone Encounter (Signed)
Inbound call from patient stating that she is bleeding from her rectum and is having abdominal pain. Patient stated it like " peeing blood out of her rectum".  Patient stated she is concerned. Requesting a call back to discuss, please advise.

## 2023-08-18 DIAGNOSIS — Z23 Encounter for immunization: Secondary | ICD-10-CM | POA: Diagnosis not present

## 2023-10-10 ENCOUNTER — Telehealth: Payer: Self-pay | Admitting: Gastroenterology

## 2023-10-10 NOTE — Telephone Encounter (Signed)
Patient called stated she has been having a lot of bleeding but not sure where its coming from, she said she had a hysterectomy so it shouldn't be vaginal. Is quite concerned and asked for a call back to discuss further. Thank you

## 2023-10-10 NOTE — Telephone Encounter (Signed)
Left message for pt to call back  °

## 2023-10-15 NOTE — Telephone Encounter (Signed)
Pt has not returned the call, will await further communication from pt.

## 2024-01-14 ENCOUNTER — Telehealth: Payer: Self-pay | Admitting: Gastroenterology

## 2024-01-14 NOTE — Telephone Encounter (Signed)
Please advise 

## 2024-01-14 NOTE — Telephone Encounter (Signed)
Patient called and stated that she is needing a refill on Omeprazole. Patient stated that the way her prescription was written her pharmacy is not letting her get a refill due to it being to early for a refill. But she stated that she was told to take twice a day. Patient is complaining of a lot more heartburn and is needing her medication need refilled and where her insurance can cover. Patient is requesting a call back. Please advise.

## 2024-01-15 MED ORDER — OMEPRAZOLE 20 MG PO CPDR: 20.0000 mg | DELAYED_RELEASE_CAPSULE | Freq: Two times a day (BID) | ORAL | 3 refills | Status: DC

## 2024-01-15 NOTE — Telephone Encounter (Signed)
New prescription was sent and patient was notified.

## 2024-02-27 ENCOUNTER — Ambulatory Visit: Payer: BC Managed Care – PPO | Admitting: Family Medicine

## 2024-02-27 ENCOUNTER — Encounter: Payer: Self-pay | Admitting: Family Medicine

## 2024-02-27 VITALS — BP 118/78 | HR 80 | Temp 97.8°F | Ht 61.0 in | Wt 207.0 lb

## 2024-02-27 DIAGNOSIS — G4452 New daily persistent headache (NDPH): Secondary | ICD-10-CM

## 2024-02-27 DIAGNOSIS — G44229 Chronic tension-type headache, not intractable: Secondary | ICD-10-CM | POA: Diagnosis not present

## 2024-02-27 DIAGNOSIS — F339 Major depressive disorder, recurrent, unspecified: Secondary | ICD-10-CM

## 2024-02-27 DIAGNOSIS — Z131 Encounter for screening for diabetes mellitus: Secondary | ICD-10-CM | POA: Diagnosis not present

## 2024-02-27 DIAGNOSIS — Z23 Encounter for immunization: Secondary | ICD-10-CM

## 2024-02-27 DIAGNOSIS — Z13220 Encounter for screening for lipoid disorders: Secondary | ICD-10-CM | POA: Diagnosis not present

## 2024-02-27 DIAGNOSIS — E669 Obesity, unspecified: Secondary | ICD-10-CM

## 2024-02-27 DIAGNOSIS — Z0000 Encounter for general adult medical examination without abnormal findings: Secondary | ICD-10-CM | POA: Diagnosis not present

## 2024-02-27 DIAGNOSIS — Z8249 Family history of ischemic heart disease and other diseases of the circulatory system: Secondary | ICD-10-CM | POA: Diagnosis not present

## 2024-02-27 DIAGNOSIS — E538 Deficiency of other specified B group vitamins: Secondary | ICD-10-CM

## 2024-02-27 LAB — TSH: TSH: 4.04 u[IU]/mL (ref 0.35–5.50)

## 2024-02-27 LAB — CBC
HCT: 36.9 % (ref 36.0–46.0)
Hemoglobin: 12 g/dL (ref 12.0–15.0)
MCHC: 32.4 g/dL (ref 30.0–36.0)
MCV: 87.7 fl (ref 78.0–100.0)
Platelets: 325 10*3/uL (ref 150.0–400.0)
RBC: 4.21 Mil/uL (ref 3.87–5.11)
RDW: 14.6 % (ref 11.5–15.5)
WBC: 6.1 10*3/uL (ref 4.0–10.5)

## 2024-02-27 LAB — COMPREHENSIVE METABOLIC PANEL
ALT: 15 U/L (ref 0–35)
AST: 12 U/L (ref 0–37)
Albumin: 4.2 g/dL (ref 3.5–5.2)
Alkaline Phosphatase: 86 U/L (ref 39–117)
BUN: 12 mg/dL (ref 6–23)
CO2: 29 meq/L (ref 19–32)
Calcium: 9.4 mg/dL (ref 8.4–10.5)
Chloride: 103 meq/L (ref 96–112)
Creatinine, Ser: 0.77 mg/dL (ref 0.40–1.20)
GFR: 86.08 mL/min (ref >60.00)
Glucose, Bld: 112 mg/dL — ABNORMAL HIGH (ref 70–99)
Potassium: 4.7 meq/L (ref 3.5–5.1)
Sodium: 139 meq/L (ref 135–145)
Total Bilirubin: 0.3 mg/dL (ref 0.2–1.2)
Total Protein: 6.7 g/dL (ref 6.0–8.3)

## 2024-02-27 LAB — LIPID PANEL
Cholesterol: 226 mg/dL — ABNORMAL HIGH (ref 0–200)
HDL: 56.3 mg/dL (ref >39.00)
LDL Cholesterol: 141 mg/dL — ABNORMAL HIGH (ref 0–99)
NonHDL: 170.09
Total CHOL/HDL Ratio: 4
Triglycerides: 143 mg/dL (ref 0.0–149.0)
VLDL: 28.6 mg/dL (ref 0.0–40.0)

## 2024-02-27 LAB — B12 AND FOLATE PANEL
Folate: 8.5 ng/mL (ref >5.9)
Vitamin B-12: 277 pg/mL (ref 211–911)

## 2024-02-27 LAB — HEMOGLOBIN A1C: Hgb A1c MFr Bld: 6.2 % (ref 4.6–6.5)

## 2024-02-27 MED ORDER — CYANOCOBALAMIN 1000 MCG/ML IJ SOLN: 1000.0000 ug | INTRAMUSCULAR | 3 refills | Status: AC

## 2024-02-27 MED ORDER — VENLAFAXINE HCL ER 150 MG PO CP24: 150.0000 mg | ORAL_CAPSULE | Freq: Every day | ORAL | 1 refills | Status: DC

## 2024-02-27 MED ORDER — SUMATRIPTAN SUCCINATE 50 MG PO TABS: 50.0000 mg | ORAL_TABLET | ORAL | 11 refills | Status: AC | PRN

## 2024-02-27 NOTE — Patient Instructions (Addendum)

## 2024-02-27 NOTE — Progress Notes (Signed)
 .    Patient ID: Cathy [name], female  DOB: 22-May-1966, 58 y.o.   MRN: [id number] Patient Care Team    Relationship Specialty Notifications Start End  Natalia Leatherwood, DO PCP - General Family Medicine  10/07/17   Runell Gess, MD Consulting Physician Cardiology  09/27/17   Huel Cote, MD Consulting Physician Obstetrics and Gynecology  09/27/17   Jenel Lucks, MD Consulting Physician Gastroenterology  02/12/23     Chief Complaint  Patient presents with   Annual Exam    And chronic condition management. Pt is fasting.     Subjective: Cathy [name] is a 58 y.o.  Female  present for CPE and chronic condition management combined appointment All past medical history, surgical history, allergies, family history, immunizations, medications and social history were updated in the electronic medical record today. All recent labs, ED visits and hospitalizations within the last year were reviewed.  Health maintenance:  Colonoscopy: 04/18/2022- 3 yr follow up , Dr. Tomasa Rand Mammogram: completed: 2024- GYN> requested Cervical cancer screening: last pelvic: 01/2022,  by: GYN. Dr Senaida Ores Immunizations: tdap UTD 2017, Influenza declined(encouraged yearly), shingrix declined Infectious disease screening: HIV completed. Hep C cpmpleted today DEXA: screen ~60 Assistive device: none Oxygen QMV:HQIO Patient has a Dental home. Hospitalizations/ED visits: reviewed  Depression/anxiety: Patient reports she is feeling well since starting back on the Effexor.150 mg daily. Prior note:  Chronic headache: Patient reports compliance with B12 injections every 2 weeks.  She is using the Vistaril and Imitrex as needed.  She reports she has had more headache days over the last couple months, about 10 headache days a month.      02/27/2024    8:07 AM 07/30/2023    8:47 AM 02/12/2023    9:36 AM 01/14/2023    1:31 PM 07/17/2022    9:52 AM  Depression screen PHQ 2/9  Decreased Interest 1 1 2  0 1   Down, Depressed, Hopeless 1 1 2  0 1  PHQ - 2 Score 2 2 4  0 2  Altered sleeping 2 2 2  2   Tired, decreased energy 2 2 2  3   Change in appetite 1 3 0  3  Feeling bad or failure about yourself  1 2 1  1   Trouble concentrating 1 2 1  2   Moving slowly or fidgety/restless 2 1 1  1   Suicidal thoughts 0 0 0  1  PHQ-9 Score 11 14 11  15   Difficult doing work/chores Somewhat difficult Somewhat difficult         02/27/2024    8:08 AM 07/30/2023    8:47 AM 02/12/2023    9:44 AM 07/17/2022    9:56 AM  GAD 7 : Generalized Anxiety Score  Nervous, Anxious, on Edge 1 2 2 2   Control/stop worrying 2 2 2 2   Worry too much - different things 2 2 2 2   Trouble relaxing 2 2 2 3   Restless 1 2 2 3   Easily annoyed or irritable 1 1 2 1   Afraid - awful might happen 0 0 0 1  Total GAD 7 Score 9 11 12 14   Anxiety Difficulty Somewhat difficult Somewhat difficult      Immunization History  Administered Date(s) Administered   Influenza,inj,Quad PF,6+ Mos 08/26/2018   Influenza-Unspecified 11/15/2014   Tdap 10/12/2010, 04/12/2016    Past Medical History:  Diagnosis Date   Anemia    Bandemia 02/13/2023   Colon polyp    Depression    Dysphagia  06/29/2022   Dyspnea on exertion    With atypical chest pain, followed by cardiology.   Genital warts 06/2016   TCA application by GYn   History of frequent urinary tract infections    Ileitis    Juvenile epilepsy (HCC)    Migraines    Rectal bleeding 07/30/2023   Seizures (HCC)    none since 1987- classified as epileptic seizures   Tendonitis of ankle or foot 05/2022   right   Thrombosed external hemorrhoid    Allergies  Allergen Reactions   Macrobid [Nitrofurantoin Monohyd Macro] Anaphylaxis   Nitrofurantoin Macrocrystal Shortness Of Breath   Past Surgical History:  Procedure Laterality Date   BILATERAL SALPINGECTOMY Bilateral 12/20/2014   Procedure: BILATERAL SALPINGECTOMY;  Surgeon: Oliver Pila, MD;  Location: [facility];  Service: Gynecology;   Laterality: Bilateral;   COLONOSCOPY  03/2022   endocolon   HEMORRHOID SURGERY     thrombosed hemorrhoid   LAPAROSCOPIC ASSISTED VAGINAL HYSTERECTOMY N/A 12/20/2014   Procedure: LAPAROSCOPIC ASSISTED VAGINAL HYSTERECTOMY;  Surgeon: Oliver Pila, MD;  Location: [facility];  Service: Gynecology;  Laterality: N/A;   tendon repair Right 12/24/2009   right ankle   TONSILLECTOMY  12/25/1971   UPPER GASTROINTESTINAL ENDOSCOPY     Family History  Problem Relation Age of Onset   Hyperlipidemia Mother    Heart disease Mother    Hypertension Mother    Ovarian cancer Mother    Cervical cancer Mother    Heart attack Mother    Depression Sister    Asthma Sister    Dementia Maternal Grandmother    Heart attack Maternal Grandmother    Heart attack Maternal Grandfather    Heart attack Paternal Grandmother    Asthma Son    Colon cancer Neg Hx    Pancreatic cancer Neg Hx    Liver cancer Neg Hx    Esophageal cancer Neg Hx    Social History   Social History Narrative   Divorced. 3 children. Bachelors degree. Works as an Airline pilot.   Exercises routinely.   Wears her seatbelt. Wears a bicycle helmet. Smoke detector in the home.   Feels safe in her relationships.    Allergies as of 02/27/2024       Reactions   Macrobid [nitrofurantoin Monohyd Macro] Anaphylaxis   Nitrofurantoin Macrocrystal Shortness Of Breath        Medication List        Accurate as of February 27, 2024 10:16 AM. If you have any questions, ask your nurse or doctor.          cyanocobalamin 1000 MCG/ML injection Commonly known as: VITAMIN B12 Inject 1 mL (1,000 mcg total) into the muscle every 14 (fourteen) days.   dicyclomine 10 MG capsule Commonly known as: BENTYL Take 2 capsules (20 mg total) by mouth every 6 (six) hours as needed for spasms.   omeprazole 40 MG capsule Commonly known as: PRILOSEC TAKE 1 CAPSULE(40 MG) BY MOUTH DAILY   omeprazole 20 MG capsule Commonly known as: PRILOSEC Take 1  capsule (20 mg total) by mouth 2 (two) times daily.   SUMAtriptan 50 MG tablet Commonly known as: Imitrex Take 1 tablet (50 mg total) by mouth every 2 (two) hours as needed for migraine. May repeat in 2 hours if headache persists or recurs.   SYRINGE 3CC/21GX1" 21G X 1" 3 ML Misc Inject 1 mL into the muscle every 14 (fourteen) days. Pt to inject 1mL every 14 days intramuscular DX  E53.8  venlafaxine XR 150 MG 24 hr capsule Commonly known as: Effexor XR Take 1 capsule (150 mg total) by mouth daily with breakfast.        All past medical history, surgical history, allergies, family history, immunizations andmedications were updated in the EMR today and reviewed under the history and medication portions of their EMR.     No results found for this or any previous visit (from the past 2160 hours).   ROS 14 pt review of systems performed and negative (unless mentioned in an HPI)  Objective: BP 118/78   Pulse 80   Temp 97.8 F (36.6 C)   Ht 5\' 1"  (1.549 m)   Wt 207 lb (93.9 kg)   LMP 11/19/2014   SpO2 98%   BMI 39.11 kg/m  Physical Exam Vitals and nursing note reviewed.  Constitutional:      General: She is not in acute distress.    Appearance: Normal appearance. She is not ill-appearing or toxic-appearing.  HENT:     Head: Normocephalic and atraumatic.     Right Ear: Tympanic membrane, ear canal and external ear normal. There is no impacted cerumen.     Left Ear: Tympanic membrane, ear canal and external ear normal. There is no impacted cerumen.     Nose: No congestion or rhinorrhea.     Mouth/Throat:     Mouth: Mucous membranes are moist.     Pharynx: Oropharynx is clear. No oropharyngeal exudate or posterior oropharyngeal erythema.  Eyes:     General: No scleral icterus.       Right eye: No discharge.        Left eye: No discharge.     Extraocular Movements: Extraocular movements intact.     Conjunctiva/sclera: Conjunctivae normal.     Pupils: Pupils are equal,  round, and reactive to light.  Cardiovascular:     Rate and Rhythm: Normal rate and regular rhythm.     Pulses: Normal pulses.     Heart sounds: Normal heart sounds. No murmur heard.    No friction rub. No gallop.  Pulmonary:     Effort: Pulmonary effort is normal. No respiratory distress.     Breath sounds: Normal breath sounds. No stridor. No wheezing, rhonchi or rales.  Chest:     Chest wall: No tenderness.  Abdominal:     General: Abdomen is flat. Bowel sounds are normal. There is no distension.     Palpations: Abdomen is soft. There is no mass.     Tenderness: There is no abdominal tenderness. There is no right CVA tenderness, left CVA tenderness, guarding or rebound.     Hernia: No hernia is present.  Musculoskeletal:        General: No swelling, tenderness or deformity. Normal range of motion.     Cervical back: Normal range of motion and neck supple. No rigidity or tenderness.     Right lower leg: No edema.     Left lower leg: No edema.  Lymphadenopathy:     Cervical: No cervical adenopathy.  Skin:    General: Skin is warm and dry.     Coloration: Skin is not jaundiced or pale.     Findings: No bruising, erythema, lesion or rash.  Neurological:     General: No focal deficit present.     Mental Status: She is alert and oriented to person, place, and time. Mental status is at baseline.     Cranial Nerves: No cranial nerve deficit.  Sensory: No sensory deficit.     Motor: No weakness.     Coordination: Coordination normal.     Gait: Gait normal.     Deep Tendon Reflexes: Reflexes normal.  Psychiatric:        Mood and Affect: Mood normal.        Behavior: Behavior normal.        Thought Content: Thought content normal.        Judgment: Judgment normal.      No results found.  Assessment/plan: Cathy [name] is a 58 y.o. female present for cpe and chronic condition appointment Nonintractable headache, unspecified chronicity pattern, unspecified headache  type/fatigue/B12 -Continue B12 injections every 2 weeks.  Patient self injects. - CBC, Cmp, Magnesium,TSH, ANA--> Normal and prior headache work-ups. Continue  imitrex as needed -Encouraged her to continue the B12 supplement and add magnesium 400 mg before bed. -She declines referral to neurology or preventative med for migraines today. -B12 and folate levels collected today Depression, recurrent (HCC) Stable Continue Effexor 150 mg daily   Need for zoster vaccination/Influenza vaccine needed Declined  E66.9 (BMI 30-39.9)/lipid screening/family history of heart disease - Lipid panel - Hemoglobin A1c - CT CARDIAC SCORING (SELF PAY ONLY); Future Diabetes mellitus screening - Lipid panel - Hemoglobin A1c   Routine general medical examination at a health care facility (Primary) - CBC - Comp Met (CMET) - TSH Patient was encouraged to exercise greater than 150 minutes a week. Patient was encouraged to choose a diet filled with fresh fruits and vegetables, and lean meats. AVS provided to patient today for education/recommendation on gender specific health and safety maintenance. Colonoscopy: 04/18/2022- 3 yr follow up , Dr. Tomasa Rand Mammogram: completed: 2024- GYN> requested Cervical cancer screening: last pelvic: 01/2022,  by: GYN. Dr Senaida Ores Immunizations: tdap UTD 2017, Influenza declined(encouraged yearly), shingrix declined Infectious disease screening: HIV completed. Hep C cpmpleted today DEXA: screen ~60  Return in about 24 weeks (around 08/13/2024) for Routine chronic condition follow-up.    Orders Placed This Encounter  Procedures   CT CARDIAC SCORING (SELF PAY ONLY)   CBC   Comp Met (CMET)   TSH   Lipid panel   Hemoglobin A1c   B12 and Folate Panel   Meds ordered this encounter  Medications   venlafaxine XR (EFFEXOR XR) 150 MG 24 hr capsule    Sig: Take 1 capsule (150 mg total) by mouth daily with breakfast.    Dispense:  90 capsule    Refill:  1    SUMAtriptan (IMITREX) 50 MG tablet    Sig: Take 1 tablet (50 mg total) by mouth every 2 (two) hours as needed for migraine. May repeat in 2 hours if headache persists or recurs.    Dispense:  10 tablet    Refill:  11   cyanocobalamin (VITAMIN B12) 1000 MCG/ML injection    Sig: Inject 1 mL (1,000 mcg total) into the muscle every 14 (fourteen) days.    Dispense:  10 mL    Refill:  3    Please include needle/syringe. Thanks   Referral Orders  No referral(s) requested today     Electronically signed by: Felix Pacini, DO [facility]

## 2024-02-28 ENCOUNTER — Encounter: Payer: Self-pay | Admitting: Family Medicine

## 2024-03-03 DIAGNOSIS — L820 Inflamed seborrheic keratosis: Secondary | ICD-10-CM | POA: Diagnosis not present

## 2024-03-03 DIAGNOSIS — L821 Other seborrheic keratosis: Secondary | ICD-10-CM | POA: Diagnosis not present

## 2024-03-06 ENCOUNTER — Ambulatory Visit (HOSPITAL_COMMUNITY)
Admission: RE | Admit: 2024-03-06 | Discharge: 2024-03-06 | Disposition: A | Discharge status: Home / Self Care | Payer: Self-pay | Source: Ambulatory Visit | Attending: Family Medicine | Admitting: Family Medicine

## 2024-03-06 DIAGNOSIS — Z8249 Family history of ischemic heart disease and other diseases of the circulatory system: Secondary | ICD-10-CM | POA: Insufficient documentation

## 2024-03-06 DIAGNOSIS — E669 Obesity, unspecified: Secondary | ICD-10-CM | POA: Insufficient documentation

## 2024-03-09 ENCOUNTER — Encounter: Payer: Self-pay | Admitting: Family Medicine

## 2024-03-27 ENCOUNTER — Ambulatory Visit: Payer: BC Managed Care – PPO | Admitting: Gastroenterology

## 2024-03-27 ENCOUNTER — Encounter: Payer: Self-pay | Admitting: Gastroenterology

## 2024-03-27 VITALS — BP 122/70 | HR 84 | Ht 61.0 in | Wt 212.0 lb

## 2024-03-27 DIAGNOSIS — K602 Anal fissure, unspecified: Secondary | ICD-10-CM

## 2024-03-27 DIAGNOSIS — Z860100 Personal history of colon polyps, unspecified: Secondary | ICD-10-CM

## 2024-03-27 DIAGNOSIS — K645 Perianal venous thrombosis: Secondary | ICD-10-CM | POA: Diagnosis not present

## 2024-03-27 DIAGNOSIS — K648 Other hemorrhoids: Secondary | ICD-10-CM | POA: Diagnosis not present

## 2024-03-27 DIAGNOSIS — K6289 Other specified diseases of anus and rectum: Secondary | ICD-10-CM

## 2024-03-27 MED ORDER — AMBULATORY NON FORMULARY MEDICATION: 0 refills | Status: AC

## 2024-03-27 MED ORDER — AMBULATORY NON FORMULARY MEDICATION: 0 refills | Status: DC

## 2024-03-27 MED ORDER — HYDROCORTISONE (PERIANAL) 2.5 % EX CREA: 1.0000 | TOPICAL_CREAM | Freq: Two times a day (BID) | CUTANEOUS | 1 refills | Status: AC

## 2024-03-27 NOTE — Progress Notes (Addendum)
 Chief Complaint: Rectal pain Primary GI MD: Dr. Tomasa Rand  HPI: 58 year old female with medical history as listed below presents for evaluation of rectal pain.  Last seen in 2023 by Dr. Tomasa Rand.  See his note for details.  She is here today for persistent rectal pain and bleeding ongoing for few years worsening over the last 2 months.  She states anytime she goes to the bathroom she will have moderate amount of rectal bleeding and pain that feels like "razor blades".  She alternates between diarrhea and constipation.  She is not on a fiber supplement.  Her mom also struggles with this issue and often has to use heating pads on her buttocks.  Patient has not tried any over-the-counter remedies.  She was previously offered internal hemorrhoid banding by Dr. Tomasa Rand    PREVIOUS GI WORKUP   EGD 06/2022 - Normal esophagus. Biopsied.  - Gastroesophageal flap valve classified as Hill Grade III ( minimal fold, loose to endoscope, hiatal hernia likely) .  - Normal stomach.  - Normal examined duodenum.  - The previously noted reflux esophagitis has healed. No esophageal stenosis was seen.   Colonoscopy 03/2022 - Hemorrhoids found on perianal exam.  - One 3 mm polyp in the cecum, removed with a cold snare. Resected and retrieved.  - One 10 mm polyp in the distal transverse colon, removed with a cold snare. Resected and retrieved.  - The examined portion of the ileum was normal.  - Non- bleeding internal hemorrhoids. This is the source of the patient' s hematochezia.  - The ileitis seen on CT was likely secondary to infectious enteritis. No evidence of Crohn' s disease or any other abnormality in the terminal ileum -Repeat 3 years (03/2025)  Past Medical History:  Diagnosis Date   Anemia    Bandemia 02/13/2023   Colon polyp    Depression    Dysphagia 06/29/2022   Dyspnea on exertion    With atypical chest pain, followed by cardiology.   Genital warts 06/2016   TCA application  by GYn   History of frequent urinary tract infections    Ileitis    Juvenile epilepsy (HCC)    Migraines    Rectal bleeding 07/30/2023   Seizures (HCC)    none since 1987- classified as epileptic seizures   Tendonitis of ankle or foot 05/2022   right   Thrombosed external hemorrhoid     Past Surgical History:  Procedure Laterality Date   BILATERAL SALPINGECTOMY Bilateral 12/20/2014   Procedure: BILATERAL SALPINGECTOMY;  Surgeon: Oliver Pila, MD;  Location: [facility];  Service: Gynecology;  Laterality: Bilateral;   COLONOSCOPY  03/2022   endocolon   HEMORRHOID SURGERY     thrombosed hemorrhoid   LAPAROSCOPIC ASSISTED VAGINAL HYSTERECTOMY N/A 12/20/2014   Procedure: LAPAROSCOPIC ASSISTED VAGINAL HYSTERECTOMY;  Surgeon: Oliver Pila, MD;  Location: [facility];  Service: Gynecology;  Laterality: N/A;   tendon repair Right 12/24/2009   right ankle   TONSILLECTOMY  12/25/1971   UPPER GASTROINTESTINAL ENDOSCOPY      Current Outpatient Medications  Medication Sig Dispense Refill   cyanocobalamin (VITAMIN B12) 1000 MCG/ML injection Inject 1 mL (1,000 mcg total) into the muscle every 14 (fourteen) days. 10 mL 3   dicyclomine (BENTYL) 10 MG capsule Take 2 capsules (20 mg total) by mouth every 6 (six) hours as needed for spasms. 60 capsule 1   omeprazole (PRILOSEC) 20 MG capsule Take 1 capsule (20 mg total) by mouth 2 (two) times daily. 180 capsule  3   omeprazole (PRILOSEC) 40 MG capsule TAKE 1 CAPSULE(40 MG) BY MOUTH DAILY (Patient taking differently: Pt is taking 20 mg twice a day) 90 capsule 3   SUMAtriptan (IMITREX) 50 MG tablet Take 1 tablet (50 mg total) by mouth every 2 (two) hours as needed for migraine. May repeat in 2 hours if headache persists or recurs. 10 tablet 11   Syringe/Needle, Disp, (SYRINGE 3CC/21GX1") 21G X 1" 3 ML MISC Inject 1 mL into the muscle every 14 (fourteen) days. Pt to inject 1mL every 14 days intramuscular DX  E53.8 48 each 0   venlafaxine XR (EFFEXOR  XR) 150 MG 24 hr capsule Take 1 capsule (150 mg total) by mouth daily with breakfast. 90 capsule 1   No current facility-administered medications for this visit.    Allergies as of 03/27/2024 - Review Complete 03/27/2024  Allergen Reaction Noted   Macrobid [nitrofurantoin monohyd macro] Anaphylaxis 01/18/2014   Nitrofurantoin macrocrystal Shortness Of Breath 06/29/2022    Family History  Problem Relation Age of Onset   Hyperlipidemia Mother    Heart disease Mother    Hypertension Mother    Ovarian cancer Mother    Cervical cancer Mother    Heart attack Mother    Depression Sister    Asthma Sister    Dementia Maternal Grandmother    Heart attack Maternal Grandmother    Heart attack Maternal Grandfather    Heart attack Paternal Grandmother    Asthma Son    Colon cancer Neg Hx    Pancreatic cancer Neg Hx    Liver cancer Neg Hx    Esophageal cancer Neg Hx     Social History   Socioeconomic History   Marital status: Divorced    Spouse name: Not on file   Number of children: 3   Years of education: 16   Highest education level: Not on file  Occupational History   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 less than monthly   Drug use: Never   Sexual activity: Yes    Partners: Male    Birth control/protection: Post-menopausal, Surgical    Comment: partial hysterectemy  Other Topics Concern   Not on file  Social History Narrative   Divorced. 3 children. Bachelors degree. Works as an Airline pilot.   Exercises routinely.   Wears her seatbelt. Wears a bicycle helmet. Smoke detector in the home.   Feels safe in her relationships.   Social Drivers of Manufacturing engineer Strain: Not on file  Food Insecurity: Not on file  Transportation Needs: Not on file  Physical Activity: Not on file  Stress: Not on file  Social Connections: Not on file  Intimate Partner Violence: Not on file    Review of Systems:    Constitutional: No weight loss, fever, chills, weakness or fatigue HEENT: Eyes: No change in vision               Ears, Nose, Throat:  No change in hearing or congestion Skin: No rash or itching Cardiovascular: No chest pain, chest pressure or palpitations   Respiratory: No SOB or cough Gastrointestinal: See HPI and  otherwise negative Genitourinary: No dysuria or change in urinary frequency Neurological: No headache, dizziness or syncope Musculoskeletal: No new muscle or joint pain Hematologic: No bleeding or bruising Psychiatric: No history of depression or anxiety    Physical Exam:  Vital signs: BP 122/70   Pulse 84   Ht 5\' 1"  (1.549 m)   Wt 212 lb (96.2 kg)   LMP 11/19/2014   BMI 40.06 kg/m   Constitutional: NAD, Well developed, Well nourished, alert and cooperative Head:  Normocephalic and atraumatic. Eyes:   PEERL, EOMI. No icterus. Conjunctiva pink. Respiratory: Respirations even and unlabored. Lungs clear to auscultation bilaterally.   No wheezes, crackles, or rhonchi.  Cardiovascular:  Regular rate and rhythm. No peripheral edema, cyanosis or pallor.  Gastrointestinal:  Soft, nondistended, nontender. No rebound or guarding. Normal bowel sounds. No appreciable masses or hepatomegaly. Rectal: CMA chaperone.  1 large external hemorrhoid, thrombosed, tender.  Small anal fissure posterior midline.  Small internal hemorrhoid noted on DRE.  Normal sphincter tone.  DRE was tender for patient.  No bleeding. Msk:  Symmetrical without gross deformities. Without edema, no deformity or joint abnormality.  Neurologic:  Alert and  oriented x4;  grossly normal neurologically.  Skin:   Dry and intact without significant lesions  or rashes. Psychiatric: Oriented to person, place and time. Demonstrates good judgement and reason without abnormal affect or behaviors.   RELEVANT LABS AND IMAGING: CBC    Component Value Date/Time   WBC 6.1 02/27/2024 0759   RBC 4.21 02/27/2024 0759   HGB 12.0 02/27/2024 0759   HCT 36.9 02/27/2024 0759   PLT 325.0 02/27/2024 0759   MCV 87.7 02/27/2024 0759   MCH 31.2 03/04/2022 1530   MCHC 32.4 02/27/2024 0759   RDW 14.6 02/27/2024 0759   LYMPHSABS 1.7 07/30/2023 0922   MONOABS 0.3 07/30/2023 0922   EOSABS 0.1 07/30/2023 0922   BASOSABS 0.1 07/30/2023 0922    CMP     Component Value Date/Time   NA 139 02/27/2024 0759   NA 142 06/18/2017 1114   K 4.7 02/27/2024 0759   CL 103 02/27/2024 0759   CO2 29 02/27/2024 0759   GLUCOSE 112 (H) 02/27/2024 0759   BUN 12 02/27/2024 0759   BUN 8 06/18/2017 1114   CREATININE 0.77 02/27/2024 0759   CREATININE 0.93 09/27/2017 1452   CALCIUM 9.4 02/27/2024 0759   PROT 6.7 02/27/2024 0759   PROT 7.0 06/18/2017 1114   ALBUMIN 4.2 02/27/2024 0759   ALBUMIN 4.6 06/18/2017 1114   AST 12 02/27/2024 0759   ALT 15 02/27/2024 0759   ALKPHOS 86 02/27/2024 0759   BILITOT 0.3 02/27/2024 0759   BILITOT 0.3 06/18/2017 1114   GFRNONAA >60 03/04/2022 1530   GFRAA 90 06/18/2017 1114     Assessment/Plan:   Thrombosed external hemorrhoid Anal fissure Internal hemorrhoids Suspect the majority of her pain may be coming from the large thrombosed external hemorrhoid on physical exam.  Although the presence of her anal fissure likely causing some pain and bleeding in addition to internal hemorrhoid causing some bleeding.  Up-to-date on colonoscopy in 2023 with repeat in 2026. - Urgent referral to [facility] for thrombosed external hemorrhoid - Hydrocortisone cream twice daily for 14 days - calmol 4 suppositories - NTG 1.25% use gloved hand, apply pea size amount every 6-8 hours for pain rectally, #30 gram no refills. - Recommend getting on a fiber  supplement daily.  Increase water, increase exercise  History of colon polyps Recall 2026  [facility],  PA-C [facility] 03/27/2024, 9:02 AM  Cc: Felix Pacini A, DO

## 2024-03-27 NOTE — Patient Instructions (Addendum)
 We have sent the following medications to your pharmacy for you to pick up at your convenience: Hydrocortisone cream.   We have sent a prescription for nitroglycerin 0.125% gel to [facility]. You should apply a pea size amount to your rectum three times daily x 6-8 weeks.  [facility]'s information is below: Address: [address] [place], [place] [address]  [phone]  *Please DO NOT go directly from our office to pick up this medication! Give the pharmacy 1 day to process the prescription as this is compounded and takes time to make.  Please purchase the following medications over the counter and take as directed: Calmol 4  You have been referred to [facility]. Make certain to bring a list of current medications, including any over the counter medications or vitamins. Also bring your co-pay if you have one as well as your insurance cards. [facility] is located at [address], [address]. Someone from their office should contact to schedule, if you have not heard from them please contact them at [phone].  _______________________________________________________  If your blood pressure at your visit was 140/90 or greater, please contact your primary care physician to follow up on this.  _______________________________________________________  If you are age 25 or older, your body mass index should be between 23-30. Your Body mass index is 40.06 kg/m. If this is out of the aforementioned range listed, please consider follow up with your Primary Care Provider.  If you are age 22 or younger, your body mass index should be between 19-25. Your Body mass index is 40.06 kg/m. If this is out of the aformentioned range listed, please consider follow up with your Primary Care Provider.   ________________________________________________________  The [facility] providers would like to encourage you to use [facility] to communicate with  providers for non-urgent requests or questions.  Due to long hold times on the telephone, sending your provider a message by [facility] may be a faster and more efficient way to get a response.  Please allow 48 business hours for a response.  Please remember that this is for non-urgent requests.  _______________________________________________________  Thank you for trusting me with your gastrointestinal care!   Bayley McMichael, PA-C        Anal Fissure, Adult  A fissure is a linear defect in the anal mucosa, symptoms include burning, itching, discomfort especially with a bowel movement with associated rectal bleeding.  Risk factors include low fiber diet, chronic constipation and straining. Anal fissures can take a very long time to heal so this will be a 2 to 39-month process.  Treatment for a fissure includes:  -decreasing time in the toilet should not be more than 5 minutes -adding fiber supplement such as Benefiber or Citrucel -increasing water. -There is a lubricating suppository over-the-counter called Calmol-4 you can get from [facility] or from your pharmacy (may have to order) that I want to do twice daily morning and evening for 8 weeks.  This is a 60 to 80% success rate. -I am also going to send in a calcium channel blocker cream to a compound pharmacy, apply twice daily for 12 weeks. If after 3 months this is not helpful then we will we will refer you to general surgery for evaluation, they can do Botox injections under anesthesia or surgery.  NTG 1.25% use gloved hand, apply pea size amount every 6-8 hours for pain rectally, #30 gram no refills.

## 2024-04-02 NOTE — Progress Notes (Signed)
 Agree with the assessment and plan as outlined by Boone Master, PA-C.  Patient's pain and dyschezia for past several months more likely to be secondary to fissure, but acute pain more likely to be from thrombosed hemorrhoid.  Both need to be addressed.  If unable to be excised within 72 hours of presentation, thrombosed external hemorrhoid is best managed conservatively

## 2024-04-28 DIAGNOSIS — K642 Third degree hemorrhoids: Secondary | ICD-10-CM | POA: Diagnosis not present

## 2024-04-28 DIAGNOSIS — K644 Residual hemorrhoidal skin tags: Secondary | ICD-10-CM | POA: Diagnosis not present

## 2024-04-28 DIAGNOSIS — K625 Hemorrhage of anus and rectum: Secondary | ICD-10-CM | POA: Diagnosis not present

## 2024-05-28 ENCOUNTER — Telehealth: Payer: Self-pay

## 2024-05-28 NOTE — Telephone Encounter (Signed)
 Reminder sent for mammogram. Requested records for GYN.

## 2024-07-07 DIAGNOSIS — K642 Third degree hemorrhoids: Secondary | ICD-10-CM | POA: Diagnosis not present

## 2024-07-07 DIAGNOSIS — K644 Residual hemorrhoidal skin tags: Secondary | ICD-10-CM | POA: Diagnosis not present

## 2024-07-22 DIAGNOSIS — Z01419 Encounter for gynecological examination (general) (routine) without abnormal findings: Secondary | ICD-10-CM | POA: Diagnosis not present

## 2024-07-22 DIAGNOSIS — Z1231 Encounter for screening mammogram for malignant neoplasm of breast: Secondary | ICD-10-CM | POA: Diagnosis not present

## 2024-07-22 DIAGNOSIS — N951 Menopausal and female climacteric states: Secondary | ICD-10-CM | POA: Diagnosis not present

## 2024-07-22 DIAGNOSIS — D649 Anemia, unspecified: Secondary | ICD-10-CM | POA: Diagnosis not present

## 2024-07-22 LAB — HM MAMMOGRAPHY

## 2024-08-08 DIAGNOSIS — H66003 Acute suppurative otitis media without spontaneous rupture of ear drum, bilateral: Secondary | ICD-10-CM | POA: Diagnosis not present

## 2024-08-10 ENCOUNTER — Telehealth: Payer: Self-pay | Admitting: Family Medicine

## 2024-08-10 NOTE — Telephone Encounter (Signed)
 Cathy [name]

## 2024-08-17 ENCOUNTER — Ambulatory Visit: Admitting: Family Medicine

## 2024-08-17 ENCOUNTER — Encounter: Payer: Self-pay | Admitting: Family Medicine

## 2024-08-17 ENCOUNTER — Ambulatory Visit: Payer: Self-pay

## 2024-08-17 VITALS — BP 122/82 | HR 79 | Temp 97.9°F | Wt 196.0 lb

## 2024-08-17 DIAGNOSIS — H6993 Unspecified Eustachian tube disorder, bilateral: Secondary | ICD-10-CM | POA: Diagnosis not present

## 2024-08-17 MED ORDER — METHYLPREDNISOLONE ACETATE 80 MG/ML IJ SUSP
80.0000 mg | Freq: Once | INTRAMUSCULAR | Status: AC
  Administered 2024-08-17: 80 mg via INTRAMUSCULAR

## 2024-08-17 NOTE — Telephone Encounter (Signed)
 FYI Only or Action Required?: FYI only for provider.  Patient was last seen in primary care on 02/27/2024 by Cathy [name] A, DO.  Called Nurse Triage reporting Otalgia.  Symptoms began 2 weeks ago.  Interventions attempted: Prescription medications: is taking an antibiotic.  Symptoms are: gradually improving.  Triage Disposition: See Physician Within 24 Hours  Patient/caregiver understands and will follow disposition?: Yes           Copied from CRM #[id number]. Topic: Clinical - Red Word Triage >> Aug 17, 2024 10:12 AM Cathy [name] wrote: [facility] that prompted transfer to Nurse Triage: [facility] been on antibiotics, right ear not any better, feels swelling and piercing pain        Reason for Disposition  [1] Taking antibiotic > 72 hours (3 days) and [2] pain persists or recurs  Answer Assessment - Initial Assessment Questions 1. ANTIBIOTIC: What antibiotic are you taking? How many times per day?     I'm not sure, it's similar to amoxacillin, it has cillin in the name. 2. ONSET: When was the antibiotic started?     8 days ago, has 2 days left  3. LOCATION: Which ear is involved?     Right ear  4. PAIN: How bad is the pain?   (Scale 0-10; none, mild, moderate or severe)     Moderate to severe  5. FEVER: Do you have [name] fever? If Yes, ask: What is your temperature, how was it measured, and when did it start?     No 6. DISCHARGE: Is there any discharge? If Yes, ask: What color is it? (e.g., clear, white; yellow, green; bloody)     No 7. OTHER SYMPTOMS: Do you have any other symptoms? (e.g., headache, stiff neck, dizziness, vomiting, runny nose)     Headache  Protocols used: Ear - Otitis Media Follow-up Call-[name]-AH

## 2024-08-17 NOTE — Progress Notes (Signed)
 Cathy [name] , [birth year], 58 y.o., female MRN: [id number] Patient Care Team    Relationship Specialty Notifications Start End  Catherine Charlies LABOR, DO PCP - General Family Medicine  10/07/17   Court Dorn PARAS, MD Consulting Physician Cardiology  09/27/17   Estelle Service, MD Consulting Physician Obstetrics and Gynecology  09/27/17   Stacia Glendia BRAVO, MD Consulting Physician Gastroenterology  02/12/23     Chief Complaint  Patient presents with   Ear Pain     Subjective: Cathy [name] is a 58 y.o. Pt presents for an OV with complaints of otalgia of 2 weeks duration.  Associated symptoms include sharp pain in bilateral ears.  Patient reports she was seen in urgent care, was told she had bilateral ear infections treated with amoxicillin  which she is on day 9 off.  She is still having ear discomfort.. She denies any fever, chills, nausea, vomit or vertigo symptoms.  She is taking Flonase over-the-counter.     02/27/2024    8:07 AM 07/30/2023    8:47 AM 02/12/2023    9:36 AM 01/14/2023    1:31 PM 07/17/2022    9:52 AM  Depression screen PHQ 2/9  Decreased Interest 1 1 2  0 1  Down, Depressed, Hopeless 1 1 2  0 1  PHQ - 2 Score 2 2 4  0 2  Altered sleeping 2 2 2  2   Tired, decreased energy 2 2 2  3   Change in appetite 1 3 0  3  Feeling bad or failure about yourself  1 2 1  1   Trouble concentrating 1 2 1  2   Moving slowly or fidgety/restless 2 1 1  1   Suicidal thoughts 0 0 0  1  PHQ-9 Score 11 14 11  15   Difficult doing work/chores Somewhat difficult Somewhat difficult       Allergies  Allergen Reactions   Macrobid [Nitrofurantoin Monohyd Macro] Anaphylaxis   Nitrofurantoin Macrocrystal Shortness Of Breath   Social History   Social History Narrative   Divorced. 3 children. Bachelors degree. Works as an Airline pilot.   Exercises routinely.   Wears her seatbelt. Wears a bicycle helmet. Smoke detector in the home.   Feels safe in her relationships.   Past Medical History:   Diagnosis Date   Anemia    Bandemia 02/13/2023   Colon polyp    Depression    Dysphagia 06/29/2022   Dyspnea on exertion    With atypical chest pain, followed by cardiology.   Genital warts 06/2016   TCA application by GYn   History of frequent urinary tract infections    Ileitis    Juvenile epilepsy (HCC)    Migraines    Rectal bleeding 07/30/2023   Seizures (HCC)    none since 1987- classified as epileptic seizures   Tendonitis of ankle or foot 05/2022   right   Thrombosed external hemorrhoid    Past Surgical History:  Procedure Laterality Date   BILATERAL SALPINGECTOMY Bilateral 12/20/2014   Procedure: BILATERAL SALPINGECTOMY;  Surgeon: Service LELON Estelle, MD;  Location: [facility];  Service: Gynecology;  Laterality: Bilateral;   COLONOSCOPY  03/2022   endocolon   HEMORRHOID SURGERY     thrombosed hemorrhoid   LAPAROSCOPIC ASSISTED VAGINAL HYSTERECTOMY N/A 12/20/2014   Procedure: LAPAROSCOPIC ASSISTED VAGINAL HYSTERECTOMY;  Surgeon: Service LELON Estelle, MD;  Location: [facility];  Service: Gynecology;  Laterality: N/A;   tendon repair Right 12/24/2009   right ankle   TONSILLECTOMY  12/25/1971  UPPER GASTROINTESTINAL ENDOSCOPY     Family History  Problem Relation Age of Onset   Hyperlipidemia Mother    Heart disease Mother    Hypertension Mother    Ovarian cancer Mother    Cervical cancer Mother    Heart attack Mother    Depression Sister    Asthma Sister    Dementia Maternal Grandmother    Heart attack Maternal Grandmother    Heart attack Maternal Grandfather    Heart attack Paternal Grandmother    Asthma Son    Colon cancer Neg Hx    Pancreatic cancer Neg Hx    Liver cancer Neg Hx    Esophageal cancer Neg Hx    Allergies as of 08/17/2024       Reactions   Macrobid [nitrofurantoin Monohyd Macro] Anaphylaxis   Nitrofurantoin Macrocrystal Shortness Of Breath        Medication List        Accurate as of August 17, 2024 11:06 AM. If you have any questions,  ask your nurse or doctor.          AMBULATORY NON FORMULARY MEDICATION Medication Name: nitroglycerin 0.125% gel  You should apply a pea size amount to your rectum three times daily x 6-8 weeks.   cyanocobalamin  1000 MCG/ML injection Commonly known as: VITAMIN B12 Inject 1 mL (1,000 mcg total) into the muscle every 14 (fourteen) days.   dicyclomine  10 MG capsule Commonly known as: BENTYL  Take 2 capsules (20 mg total) by mouth every 6 (six) hours as needed for spasms.   hydrocortisone  2.5 % rectal cream Commonly known as: ANUSOL -HC Place 1 Application rectally 2 (two) times daily.   omeprazole  20 MG capsule Commonly known as: PRILOSEC Take 1 capsule (20 mg total) by mouth 2 (two) times daily. What changed: Another medication with the same name was removed. Continue taking this medication, and follow the directions you see here. Changed by: Charlies Bellini   SUMAtriptan  50 MG tablet Commonly known as: Imitrex  Take 1 tablet (50 mg total) by mouth every 2 (two) hours as needed for migraine. May repeat in 2 hours if headache persists or recurs.   SYRINGE 3CC/21GX1 21G X 1 3 ML Misc Inject 1 mL into the muscle every 14 (fourteen) days. Pt to inject 1mL every 14 days intramuscular DX  E53.8   venlafaxine  XR 150 MG 24 hr capsule Commonly known as: Effexor  XR Take 1 capsule (150 mg total) by mouth daily with breakfast.        All past medical history, surgical history, allergies, family history, immunizations andmedications were updated in the EMR today and reviewed under the history and medication portions of their EMR.     ROS Negative, with the exception of above mentioned in HPI   Objective:  LMP 11/19/2014  There is no height or weight on file to calculate BMI.  Physical Exam Vitals and nursing note reviewed.  Constitutional:      General: She is not in acute distress.    Appearance: Normal appearance. She is normal weight. She is not ill-appearing or  toxic-appearing.  HENT:     Head: Normocephalic and atraumatic.     Right Ear: Ear canal and external ear normal. A middle ear effusion is present. There is no impacted cerumen. No mastoid tenderness. Tympanic membrane is not injected, perforated, erythematous or retracted.     Left Ear: Ear canal and external ear normal. A middle ear effusion is present. There is no impacted cerumen. No mastoid  tenderness. Tympanic membrane is not injected, perforated, erythematous or retracted.     Nose: Nose normal.     Right Turbinates: Swollen.     Comments: Mild swelling right nasal turbinate.  No erythema.    Mouth/Throat:     Mouth: Mucous membranes are moist.     Pharynx: No oropharyngeal exudate or posterior oropharyngeal erythema.  Eyes:     General: No scleral icterus.       Right eye: No discharge.        Left eye: No discharge.     Extraocular Movements: Extraocular movements intact.     Conjunctiva/sclera: Conjunctivae normal.     Pupils: Pupils are equal, round, and reactive to light.  Cardiovascular:     Rate and Rhythm: Normal rate and regular rhythm.     Heart sounds: No murmur heard. Pulmonary:     Effort: Pulmonary effort is normal. No respiratory distress.     Breath sounds: Normal breath sounds. No wheezing, rhonchi or rales.  Musculoskeletal:     Cervical back: Neck supple. No tenderness.  Lymphadenopathy:     Cervical: No cervical adenopathy.  Skin:    Findings: No rash.  Neurological:     Mental Status: She is alert and oriented to person, place, and time. Mental status is at baseline.     Motor: No weakness.     Coordination: Coordination normal.     Gait: Gait normal.  Psychiatric:        Mood and Affect: Mood normal.        Behavior: Behavior normal.        Thought Content: Thought content normal.        Judgment: Judgment normal.      No results found. No results found. No results found for this or any previous visit (from the past 24  hours).  Assessment/Plan: Cathy [name] is a 58 y.o. female present for OV for  Eustachian tube dysfunction bilaterally Rest, hydrate.  Reassured Continue Flonase IM Depo-Medrol  injection 80 provided today F/U 4 weeks if not improved.   Reviewed expectations re: course of current medical issues. Discussed self-management of symptoms. Outlined signs and symptoms indicating need for more acute intervention. Patient verbalized understanding and all questions were answered. Patient received an After-Visit Summary.    No orders of the defined types were placed in this encounter.  No orders of the defined types were placed in this encounter.  Referral Orders  No referral(s) requested today     Note is dictated utilizing voice recognition software. Although note has been proof read prior to signing, occasional typographical errors still can be missed. If any questions arise, please do not hesitate to call for verification.   electronically signed by:  Charlies Bellini, DO  [facility]

## 2024-08-17 NOTE — Patient Instructions (Addendum)

## 2024-08-26 ENCOUNTER — Inpatient Hospital Stay

## 2024-08-26 ENCOUNTER — Inpatient Hospital Stay: Attending: Hematology | Admitting: Hematology

## 2024-08-26 VITALS — BP 124/47 | HR 82 | Temp 98.1°F | Resp 18 | Ht 61.0 in | Wt 196.2 lb

## 2024-08-26 DIAGNOSIS — D509 Iron deficiency anemia, unspecified: Secondary | ICD-10-CM | POA: Diagnosis not present

## 2024-08-26 DIAGNOSIS — E538 Deficiency of other specified B group vitamins: Secondary | ICD-10-CM | POA: Insufficient documentation

## 2024-08-26 DIAGNOSIS — Z8049 Family history of malignant neoplasm of other genital organs: Secondary | ICD-10-CM | POA: Diagnosis not present

## 2024-08-26 DIAGNOSIS — Z8041 Family history of malignant neoplasm of ovary: Secondary | ICD-10-CM | POA: Insufficient documentation

## 2024-08-26 DIAGNOSIS — R5383 Other fatigue: Secondary | ICD-10-CM | POA: Insufficient documentation

## 2024-08-26 LAB — CMP (CANCER CENTER ONLY)
ALT: 8 U/L (ref 0–44)
AST: 9 U/L — ABNORMAL LOW (ref 15–41)
Albumin: 4.1 g/dL (ref 3.5–5.0)
Alkaline Phosphatase: 90 U/L (ref 38–126)
Anion gap: 7 (ref 5–15)
BUN: 17 mg/dL (ref 6–20)
CO2: 28 mmol/L (ref 22–32)
Calcium: 8.9 mg/dL (ref 8.9–10.3)
Chloride: 103 mmol/L (ref 98–111)
Creatinine: 0.72 mg/dL (ref 0.44–1.00)
GFR, Estimated: >60 mL/min (ref >60)
Glucose, Bld: 101 mg/dL — ABNORMAL HIGH (ref 70–99)
Potassium: 3.7 mmol/L (ref 3.5–5.1)
Sodium: 138 mmol/L (ref 135–145)
Total Bilirubin: 0.2 mg/dL (ref 0.0–1.2)
Total Protein: 7.3 g/dL (ref 6.5–8.1)

## 2024-08-26 LAB — CBC WITH DIFFERENTIAL (CANCER CENTER ONLY)
Abs Immature Granulocytes: 0.03 K/uL (ref 0.00–0.07)
Basophils Absolute: 0 K/uL (ref 0.0–0.1)
Basophils Relative: 1 %
Eosinophils Absolute: 0.1 K/uL (ref 0.0–0.5)
Eosinophils Relative: 1 %
HCT: 27.4 % — ABNORMAL LOW (ref 36.0–46.0)
Hemoglobin: 8.3 g/dL — ABNORMAL LOW (ref 12.0–15.0)
Immature Granulocytes: 0 %
Lymphocytes Relative: 28 %
Lymphs Abs: 2.2 K/uL (ref 0.7–4.0)
MCH: 21.5 pg — ABNORMAL LOW (ref 26.0–34.0)
MCHC: 30.3 g/dL (ref 30.0–36.0)
MCV: 71 fL — ABNORMAL LOW (ref 80.0–100.0)
Monocytes Absolute: 0.5 K/uL (ref 0.1–1.0)
Monocytes Relative: 6 %
Neutro Abs: 5 K/uL (ref 1.7–7.7)
Neutrophils Relative %: 64 %
Platelet Count: 364 K/uL (ref 150–400)
RBC: 3.86 MIL/uL — ABNORMAL LOW (ref 3.87–5.11)
RDW: 16.7 % — ABNORMAL HIGH (ref 11.5–15.5)
WBC Count: 7.8 K/uL (ref 4.0–10.5)
nRBC: 0 % (ref 0.0–0.2)

## 2024-08-26 LAB — TSH: TSH: 3.46 u[IU]/mL (ref 0.350–4.500)

## 2024-08-26 LAB — FERRITIN: Ferritin: 7 ng/mL — ABNORMAL LOW (ref 11–307)

## 2024-08-26 LAB — VITAMIN B12: Vitamin B-12: 463 pg/mL (ref 180–914)

## 2024-08-26 NOTE — Progress Notes (Signed)
 [facility]   HEMATOLOGY/ONCOLOGY CONSULTATION NOTE  Date of Service: 08/26/2024  Patient Care Team: Catherine Charlies LABOR, DO as PCP - General (Family Medicine) Court Dorn PARAS, MD as Consulting Physician (Cardiology) Estelle Service, MD as Consulting Physician (Obstetrics and Gynecology) Stacia Glendia BRAVO, MD as Consulting Physician (Gastroenterology)  CHIEF COMPLAINTS/PURPOSE OF CONSULTATION:  Iron deficiency anemia  HISTORY OF PRESENTING ILLNESS:   Cathy [name] is a wonderful 58 y.o. female who has been referred to us  by Dr Estelle Service, MD  for evaluation and management of Anemia.  Patient has a history of seizures, migraines, frequent urinary tract infections, depression who has been referred to us  for evaluation and management of anemia. She notes she has a history of B12 deficiency and has been on self-administered B12 injections every 2 weeks.  She has been on long term omeprazole  use for reflux. Does note some issues with hemorrhoidal bleeding. Status post laparoscopic vaginal hysterectomy on 12/20/2014. She has had a GI workup with Dr. Glendia Stacia in 2023.  EGD in July 2023 showed possible hiatal hernia with reflux esophagitis.  Colonoscopy done April 2023.  Noted to have a few small polyps that were removed.  CT done around that time had shown some ileitis which was thought to be infectious and not related to Crohn's. Patient notes no melena or significant GI bleeding at this time.  She notes fatigue but no lightheadedness or dizziness or chest pain.   MEDICAL HISTORY:  Past Medical History:  Diagnosis Date   Anemia    Bandemia 02/13/2023   Colon polyp    Depression    Dysphagia 06/29/2022   Dyspnea on exertion    With atypical chest pain, followed by cardiology.   Genital warts 06/2016   TCA application by GYn   History of frequent urinary tract infections    Ileitis    Juvenile epilepsy (HCC)    Migraines    Rectal bleeding 07/30/2023   Seizures (HCC)     none since 1987- classified as epileptic seizures   Tendonitis of ankle or foot 05/2022   right   Thrombosed external hemorrhoid     SURGICAL HISTORY: Past Surgical History:  Procedure Laterality Date   BILATERAL SALPINGECTOMY Bilateral 12/20/2014   Procedure: BILATERAL SALPINGECTOMY;  Surgeon: Service LELON Estelle, MD;  Location: [facility];  Service: Gynecology;  Laterality: Bilateral;   COLONOSCOPY  03/2022   endocolon   HEMORRHOID SURGERY     thrombosed hemorrhoid   LAPAROSCOPIC ASSISTED VAGINAL HYSTERECTOMY N/A 12/20/2014   Procedure: LAPAROSCOPIC ASSISTED VAGINAL HYSTERECTOMY;  Surgeon: Service LELON Estelle, MD;  Location: [facility];  Service: Gynecology;  Laterality: N/A;   tendon repair Right 12/24/2009   right ankle   TONSILLECTOMY  12/25/1971   UPPER GASTROINTESTINAL ENDOSCOPY      SOCIAL HISTORY: Social History   Socioeconomic History   Marital status: Divorced    Spouse name: Not on file   Number of children: 3   Years of education: 16   Highest education level: Not on file  Occupational History   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 less than monthly   Drug use: Never  Sexual activity: Yes    Partners: Male    Birth control/protection: Post-menopausal, Surgical    Comment: partial hysterectemy  Other Topics Concern   Not on file  Social History Narrative   Divorced. 3 children. Bachelors degree. Works as an Airline pilot.   Exercises routinely.   Wears her seatbelt. Wears a bicycle helmet. Smoke detector in the home.   Feels safe in her relationships.   Social Drivers of Corporate investment banker Strain: Not on file  Food Insecurity: Not on file   Transportation Needs: Not on file  Physical Activity: Not on file  Stress: Not on file  Social Connections: Not on file  Intimate Partner Violence: Not on file    FAMILY HISTORY: Family History  Problem Relation Age of Onset   Hyperlipidemia Mother    Heart disease Mother    Hypertension Mother    Ovarian cancer Mother    Cervical cancer Mother    Heart attack Mother    Depression Sister    Asthma Sister    Dementia Maternal Grandmother    Heart attack Maternal Grandmother    Heart attack Maternal Grandfather    Heart attack Paternal Grandmother    Asthma Son    Colon cancer Neg Hx    Pancreatic cancer Neg Hx    Liver cancer Neg Hx    Esophageal cancer Neg Hx     ALLERGIES:  is allergic to macrobid [nitrofurantoin monohyd macro] and nitrofurantoin macrocrystal.  MEDICATIONS:  Current Outpatient Medications  Medication Sig Dispense Refill   AMBULATORY NON FORMULARY MEDICATION Medication Name: nitroglycerin 0.125% gel  You should apply a pea size amount to your rectum three times daily x 6-8 weeks. 30 g 0   cyanocobalamin  (VITAMIN B12) 1000 MCG/ML injection Inject 1 mL (1,000 mcg total) into the muscle every 14 (fourteen) days. 10 mL 3   dicyclomine  (BENTYL ) 10 MG capsule Take 2 capsules (20 mg total) by mouth every 6 (six) hours as needed for spasms. 60 capsule 1   hydrocortisone  (ANUSOL -HC) 2.5 % rectal cream Place 1 Application rectally 2 (two) times daily. 30 g 1   omeprazole  (PRILOSEC) 20 MG capsule Take 1 capsule (20 mg total) by mouth 2 (two) times daily. 180 capsule 3   SUMAtriptan  (IMITREX ) 50 MG tablet Take 1 tablet (50 mg total) by mouth every 2 (two) hours as needed for migraine. May repeat in 2 hours if headache persists or recurs. 10 tablet 11   Syringe/Needle, Disp, (SYRINGE 3CC/21GX1) 21G X 1 3 ML MISC Inject 1 mL into the muscle every 14 (fourteen) days. Pt to inject 1mL every 14 days intramuscular DX  E53.8 48 each 0   venlafaxine  XR (EFFEXOR  XR) 150 MG 24  hr capsule Take 1 capsule (150 mg total) by mouth daily with breakfast. 90 capsule 1   No current facility-administered medications for this visit.    REVIEW OF SYSTEMS:    10 Point review of Systems was done is negative except as noted above.  PHYSICAL EXAMINATION: ECOG PERFORMANCE STATUS: 1 - Symptomatic but completely ambulatory  . Vitals:   08/26/24 1115  BP: (!) 124/47  Pulse: 82  Resp: 18  Temp: 98.1 F (36.7 C)  SpO2: 98%   Filed Weights   08/26/24 1115  Weight: 196 lb 3.2 oz (89 kg)   .Body mass index is 37.07 kg/m.  GENERAL:alert, in no acute distress and comfortable SKIN: no acute rashes, no significant lesions EYES: Mild conjunctival pallor, sclera anicteric OROPHARYNX: MMM,  no exudates, no oropharyngeal erythema or ulceration NECK: supple, no JVD LYMPH:  no palpable lymphadenopathy in the cervical, axillary or inguinal regions LUNGS: clear to auscultation b/l with normal respiratory effort HEART: regular rate & rhythm ABDOMEN:  normoactive bowel sounds , non tender, not distended. Extremity: no pedal edema PSYCH: alert & oriented x 3 with fluent speech NEURO: no focal motor/sensory deficits  LABORATORY DATA:  I have reviewed the data as listed  .    Latest Ref Rng & Units 08/26/2024   12:01 PM 02/27/2024    7:59 AM 07/30/2023    9:22 AM  CBC  WBC 4.0 - 10.5 K/uL 7.8  6.1  5.6   Hemoglobin 12.0 - 15.0 g/dL 8.3  87.9  86.7   Hematocrit 36.0 - 46.0 % 27.4  36.9  41.3   Platelets 150 - 400 K/uL 364  325.0  260.0     .    Latest Ref Rng & Units 08/26/2024   12:01 PM 02/27/2024    7:59 AM 02/12/2023    8:58 AM  CMP  Glucose 70 - 99 mg/dL 898  887  99   BUN 6 - 20 mg/dL 17  12  14    Creatinine 0.44 - 1.00 mg/dL 9.27  9.22  9.12   Sodium 135 - 145 mmol/L 138  139  141   Potassium 3.5 - 5.1 mmol/L 3.7  4.7  4.0   Chloride 98 - 111 mmol/L 103  103  102   CO2 22 - 32 mmol/L 28  29  30    Calcium 8.9 - 10.3 mg/dL 8.9  9.4  89.9   Total Protein 6.5 - 8.1  g/dL 7.3  6.7  7.1   Total Bilirubin 0.0 - 1.2 mg/dL 0.2  0.3  0.3   Alkaline Phos 38 - 126 U/L 90  86  83   AST 15 - 41 U/L 9  12  15    ALT 0 - 44 U/L 8  15  19     Component     Latest Ref Rng 08/26/2024  Iron     28 - 170 ug/dL 13 (L)   TIBC     749 - 450 ug/dL 450 (H)   Saturation Ratios     10.4 - 31.8 % 2 (L)   UIBC     148 - 442 ug/dL 463 (H)   Ferritin     11 - 307 ng/mL 7 (L)   Vitamin B12     180 - 914 pg/mL 463   Intrinsic Factor     0.0 - 1.1 AU/mL 1.1   TSH     0.350 - 4.500 uIU/mL 3.460   Parietal Cell Antibody-IgG     0.0 - 20.0 Units 4.6     Legend: (L) Low (H) High  . Lab Results  Component Value Date   IRON 13 (L) 08/26/2024   TIBC 549 (H) 08/26/2024   IRONPCTSAT 2 (L) 08/26/2024   (Iron and TIBC)  Lab Results  Component Value Date   FERRITIN 7 (L) 08/26/2024      RADIOGRAPHIC STUDIES: I have personally reviewed the radiological images as listed and agreed with the findings in the report. No results found.  ASSESSMENT & PLAN:   58 year old female with  1) Significant iron deficiency anemia hemoglobin of 8.3. Normal WBC counts and platelets. . Lab Results  Component Value Date   IRON 13 (L) 08/26/2024   TIBC 549 (H) 08/26/2024   IRONPCTSAT 2 (L) 08/26/2024   (  Iron and TIBC)  Lab Results  Component Value Date   FERRITIN 7 (L) 08/26/2024   Patient has been having some hemorrhoidal bleeding on and off and also has been on chronic PPI use which could decrease iron absorption. Patient has had a history of hiatal hernia with reflux esophagitis in the past and there could be possibility of ulceration related to this. Patient has no gluten intolerance. Has had a history of B12 deficiency and is currently on B12 shots every 2 weeks 1000 mcg.  B12 levels today are good. Due to concurrent history of B12 deficiency we did check antibodies to rule out pernicious anemia.  Her intrinsic factor and parietal cell antibody negative.  Plan - Lab  results reviewed results reviewed - Patient with notable anemia with a hemoglobin of 8.3 with severe iron deficiency. - No indication for PRBC transfusion at this time. - I discussed with patient pros and cons and ordered IV Monoferric  1000 mg for iron replacement.  She has been scheduled for this on 09/04/2024 at [facility] infusion center. - There is concern that she also could be having notable hemorrhoidal bleeding or possibly some other GI source of bleeding and would recommend she connect with Dr. Glendia Holt for GI evaluation low to consider need for hemorrhoidectomy/banding and also possible repeat GI workup. -She could continue her B12 parenterally every 2 weeks.  However in the absence of overt pernicious anemia absorption issues it would be reasonable to switch to sublingual B12 tablet or liquid 1000-2000 mcg daily. Also recommended she take vitamin B complex p.o. for at least 2 to 3 months to allow for accelerated hematopoiesis with her IV iron. - Continue follow-up with PCP and OB/GYN. - Follow-up Labs today IV monoferric  @ [facility] [address] [facility] in 1-2 weeks RTC with Dr Onesimo with labs in about 2months   The total time spent in the appointment was 60 minutes*.  All of the patient's questions were answered with apparent satisfaction. The patient knows to call the clinic with any problems, questions or concerns.   Emaline Onesimo MD MS AAHIVMS [facility] Hematology/Oncology Physician [facility]  .*Total Encounter Time as defined by the Centers for Medicare and Medicaid Services includes, in addition to the face-to-face time of a patient visit (documented in the note above) non-face-to-face time: obtaining and reviewing outside history, ordering and reviewing medications, tests or procedures, care coordination (communications with other health care professionals or caregivers) and documentation in the medical record.   08/26/2024 11:20 AM

## 2024-08-27 LAB — IRON AND IRON BINDING CAPACITY (CC-WL,HP ONLY)
Iron: 13 ug/dL — ABNORMAL LOW (ref 28–170)
Saturation Ratios: 2 % — ABNORMAL LOW (ref 10.4–31.8)
TIBC: 549 ug/dL — ABNORMAL HIGH (ref 250–450)
UIBC: 536 ug/dL — ABNORMAL HIGH (ref 148–442)

## 2024-08-28 LAB — ANTI-PARIETAL ANTIBODY: Parietal Cell Antibody-IgG: 4.6 U (ref 0.0–20.0)

## 2024-08-28 LAB — INTRINSIC FACTOR ANTIBODIES: Intrinsic Factor: 1.1 [AU]/ml (ref 0.0–1.1)

## 2024-08-31 ENCOUNTER — Telehealth: Payer: Self-pay | Admitting: Pharmacy Technician

## 2024-08-31 NOTE — Telephone Encounter (Signed)
 Auth Submission: NO AUTH NEEDED Site of care: Site of care: [facility] Payer: BCBS Medication & CPT/J Code(s) submitted: Monoferric  (Ferrci derisomaltose) [phone] Diagnosis Code:  Route of submission (phone, fax, portal):  Phone # Fax # Auth type: Buy/Bill PB Units/visits requested: X1 DOSE - LETTER SCANNED TO MEDIA TAB - NO AUTH NEEDED Reference number:  Approval from: 08/31/24 to 12/23/24

## 2024-09-03 ENCOUNTER — Encounter: Payer: Self-pay | Admitting: Hematology

## 2024-09-04 ENCOUNTER — Ambulatory Visit (INDEPENDENT_AMBULATORY_CARE_PROVIDER_SITE_OTHER)

## 2024-09-04 VITALS — BP 125/84 | HR 71 | Temp 98.0°F | Resp 16 | Ht 61.0 in | Wt 193.0 lb

## 2024-09-04 DIAGNOSIS — D509 Iron deficiency anemia, unspecified: Secondary | ICD-10-CM | POA: Diagnosis not present

## 2024-09-04 MED ORDER — ACETAMINOPHEN 325 MG PO TABS
650.0000 mg | ORAL_TABLET | Freq: Once | ORAL | Status: AC
  Administered 2024-09-04: 650 mg via ORAL
  Filled 2024-09-04: qty 2

## 2024-09-04 MED ORDER — LORATADINE 10 MG PO TABS
10.0000 mg | ORAL_TABLET | Freq: Once | ORAL | Status: AC
  Administered 2024-09-04: 10 mg via ORAL
  Filled 2024-09-04: qty 1

## 2024-09-04 MED ORDER — SODIUM CHLORIDE 0.9 % IV SOLN
1000.0000 mg | Freq: Once | INTRAVENOUS | Status: AC
  Administered 2024-09-04: 1000 mg via INTRAVENOUS
  Filled 2024-09-04 (×2): qty 10

## 2024-09-04 NOTE — Progress Notes (Signed)
 Diagnosis: Iron Deficiency Anemia  Provider:  Praveen Mannam MD  Procedure: IV Infusion  IV Type: Peripheral, IV Location: L Antecubital  Monoferric  (Ferric Derisomaltose ), Dose: 1000 mg  Infusion Start Time: 1148  Infusion Stop Time: 1210  Post Infusion IV Care: Observation period completed and Peripheral IV Discontinued  Discharge: Condition: Good, Destination: Home . AVS Declined  Performed by:  Leita FORBES Miles, LPN

## 2024-10-12 ENCOUNTER — Ambulatory Visit: Payer: Self-pay

## 2024-10-12 NOTE — Telephone Encounter (Signed)
Pt is scheduled tomorrow   No further action needed

## 2024-10-12 NOTE — Telephone Encounter (Signed)
 FYI Only or Action Required?: Action required by provider: Wanting TOC to [facility]- new cough, iron infusion sx.  Patient was last seen in primary care on 08/17/2024 by Catherine Fuller A, DO.  Called Nurse Triage reporting No chief complaint on file..  Symptoms began a week ago.  Interventions attempted: Prescription medications: Tylenol .  Symptoms are: gradually worsening.  Triage Disposition: See Physician Within 24 Hours  Patient/caregiver understands and will follow disposition?: Yes  Copied from CRM #[id number]. Topic: Clinical - Red Word Triage >> Oct 12, 2024 11:22 AM Macario HERO wrote: Red Word that prompted transfer to Nurse Triage: Patient stated she had an iron infusion and still feeling sick, joints are locking up and cramping. Patient is in severe pain. Reason for Disposition  [1] Continuous (nonstop) coughing interferes with work or school AND [2] no improvement using cough treatment per Care Advice  Answer Assessment - Initial Assessment Questions Patient with new Cough, wheezing, nasal congestion, productive green phlegm. Mucinex, hydration, hot showers. Wheezing- Does not have inhaler at home.   OB noticed Hgb 8.2, Iron infusion on 9/12 at the cancer center. She was infused with a large iron dose due to low Hgb. States it didn't really help her and not her joints are locking up and she is in pain. Hands cramping, ankles lock up. Drinking lots of water, warm tea, hot choc. Advised to try epsom salt bath, and electrolyte drinks.   Patient is wanting to transition to a different provider. She wants to find out what is going on instead of having her OB realize her labs are out of wack and come up with a plan. Patient scheduled with [facility] for acute appt and would like to transfer if ok with provider.   1. ONSET: When did the cough begin?      Last Weds 2. SEVERITY: How bad is the cough today?      moderate 3. SPUTUM: Describe the color of your sputum (e.g., none, dry cough; clear,  white, yellow, green)     Green  4. HEMOPTYSIS: Are you coughing up any blood? If Yes, ask: How much? (e.g., flecks, streaks, tablespoons, etc.)     denies 5. DIFFICULTY BREATHING: Are you having difficulty breathing? If Yes, ask: How bad is it? (e.g., mild, moderate, severe)      Wheezing- does not have an inhaler.  6. FEVER: Do you have a fever? If Yes, ask: What is your temperature, how was it measured, and when did it start?     Thurs and Fri- chills, and sweating  7. CARDIAC HISTORY: Do you have any history of heart disease? (e.g., heart attack, congestive heart failure)      CP off and on for the last 6 months, for her iron infusions. Recent heart scan.  8. LUNG HISTORY: Do you have any history of lung disease?  (e.g., pulmonary embolus, asthma, emphysema)     denies 9. PE RISK FACTORS: Do you have a history of blood clots? (or: recent major surgery, recent prolonged travel, bedridden)     denies 10. OTHER SYMPTOMS: Do you have any other symptoms? (e.g., runny nose, wheezing, chest pain)       Wheezing  12. TRAVEL: Have you traveled out of the country in the last month? (e.g., travel history, exposures)       denies  Protocols used: Cough - Acute Non-Productive-A-AH

## 2024-10-13 ENCOUNTER — Encounter: Payer: Self-pay | Admitting: Physician Assistant

## 2024-10-13 ENCOUNTER — Ambulatory Visit: Admitting: Physician Assistant

## 2024-10-13 VITALS — BP 139/65 | HR 86 | Temp 97.8°F | Ht 61.0 in | Wt 194.0 lb

## 2024-10-13 DIAGNOSIS — D508 Other iron deficiency anemias: Secondary | ICD-10-CM | POA: Diagnosis not present

## 2024-10-13 DIAGNOSIS — J209 Acute bronchitis, unspecified: Secondary | ICD-10-CM

## 2024-10-13 DIAGNOSIS — R6889 Other general symptoms and signs: Secondary | ICD-10-CM | POA: Diagnosis not present

## 2024-10-13 LAB — POC SOFIA 2 FLU + SARS ANTIGEN FIA
Influenza A, POC: NEGATIVE
Influenza B, POC: NEGATIVE
SARS Coronavirus 2 Ag: NEGATIVE

## 2024-10-13 LAB — POCT RAPID STREP A (OFFICE): Rapid Strep A Screen: NEGATIVE

## 2024-10-13 MED ORDER — METHYLPREDNISOLONE 4 MG PO TBPK: ORAL_TABLET | ORAL | 0 refills | Status: AC

## 2024-10-13 MED ORDER — HYDROCOD POLI-CHLORPHE POLI ER 10-8 MG/5ML PO SUER: 5.0000 mL | Freq: Two times a day (BID) | ORAL | 0 refills | Status: AC | PRN

## 2024-10-13 MED ORDER — BENZONATATE 200 MG PO CAPS
200.0000 mg | ORAL_CAPSULE | Freq: Three times a day (TID) | ORAL | 0 refills | Status: AC | PRN
Start: 2024-10-13 — End: ?

## 2024-10-13 NOTE — Patient Instructions (Signed)
 Acute Bronchitis, Adult  Acute bronchitis is when air tubes in the lungs (bronchi) suddenly get swollen. The condition can make it hard for you to breathe. In adults, acute bronchitis usually goes away within 2 weeks. A cough caused by bronchitis may last up to 3 weeks. Smoking, allergies, and asthma can make the condition worse. What are the causes? Germs that cause cold and flu (viruses). The most common cause of this condition is the virus that causes the common cold. Bacteria. Substances that bother (irritate) the lungs, including: Smoke from cigarettes and other types of tobacco. Dust and pollen. Fumes from chemicals, gases, or burned fuel. Indoor or outdoor air pollution. What increases the risk? A weak body's defense system. This is also called the immune system. Any condition that affects your lungs and breathing, such as asthma. What are the signs or symptoms? A cough. Coughing up clear, yellow, or green mucus. Making high-pitched whistling sounds when you breathe, most often when you breathe out (wheezing). Runny or stuffy nose. Having too much mucus in your lungs (chest congestion). Shortness of breath. Body aches. A sore throat. How is this treated? Acute bronchitis may go away over time without treatment. Your doctor may tell you to: Drink more fluids. This will help thin your mucus so it is easier to cough up. Use a device that gets medicine into your lungs (inhaler). Use a vaporizer or a humidifier. These are machines that add water to the air. This helps with coughing and poor breathing. Take a medicine that thins mucus and helps clear it from your lungs. Take a medicine that prevents or stops coughing. It is not common to take an antibiotic medicine for this condition. Follow these instructions at home:  Take over-the-counter and prescription medicines only as told by your doctor. Use an inhaler, vaporizer, or humidifier as told by your doctor. Take two teaspoons  (10 mL) of honey at bedtime. This helps lessen your coughing at night. Drink enough fluid to keep your pee (urine) pale yellow. Do not smoke or use any products that contain nicotine or tobacco. If you need help quitting, ask your doctor. Get a lot of rest. Return to your normal activities when your doctor says that it is safe. Keep all follow-up visits. How is this prevented?  Wash your hands often with soap and water for at least 20 seconds. If you cannot use soap and water, use hand sanitizer. Avoid contact with people who have cold symptoms. Try not to touch your mouth, nose, or eyes with your hands. Avoid breathing in smoke or chemical fumes. Make sure to get the flu shot every year. Contact a doctor if: Your symptoms do not get better in 2 weeks. You have trouble coughing up the mucus. Your cough keeps you awake at night. You have a fever. Get help right away if: You cough up blood. You have chest pain. You have very bad shortness of breath. You faint or keep feeling like you are going to faint. You have a very bad headache. Your fever or chills get worse. These symptoms may be an emergency. Get help right away. Call your local emergency services (911 in the [place] Do not wait to see if the symptoms will go away. Do not drive yourself to the hospital. Summary Acute bronchitis is when air tubes in the lungs (bronchi) suddenly get swollen. In adults, acute bronchitis usually goes away within 2 weeks. Drink more fluids. This will help thin your mucus so it is easier  to cough up. Take over-the-counter and prescription medicines only as told by your doctor. Contact a doctor if your symptoms do not improve after 2 weeks of treatment. This information is not intended to replace advice given to you by your health care provider. Make sure you discuss any questions you have with your health care provider. Document Revised: 04/12/2021 Document Reviewed: 04/12/2021 Elsevier Patient  Education  2024 [employer].

## 2024-10-13 NOTE — Progress Notes (Unsigned)
   Acute Office Visit  Subjective:     Patient ID: Cathy [name], female    DOB: 1966-01-02, 58 y.o.   MRN: [id number]  No chief complaint on file.   HPI .SABRADiscussed the use of AI scribe software for clinical note transcription with the patient, who gave verbal consent to proceed.  History of Present Illness     ROS      Objective:    BP 139/65   Pulse 86   Temp 97.8 F (36.6 C) (Temporal)   Ht 5' 1 (1.549 m)   Wt 194 lb (88 kg)   LMP 11/19/2014   SpO2 100%   BMI 36.66 kg/m  {Vitals History (Optional):23777}  Physical Exam  No results found for any visits on 10/13/24.      Assessment & Plan:  SABRASABRADiagnoses and all orders for this visit:  Flu-like symptoms -     POCT rapid strep A -     POC SOFIA 2 FLU + SARS ANTIGEN FIA    Assessment & Plan     No follow-ups on file.  Letoya Stallone, PA-C

## 2024-10-14 ENCOUNTER — Encounter: Payer: Self-pay | Admitting: Physician Assistant

## 2024-10-14 MED ORDER — AZITHROMYCIN 250 MG PO TABS: ORAL_TABLET | ORAL | 0 refills | Status: DC

## 2024-10-21 ENCOUNTER — Ambulatory Visit: Admitting: Physician Assistant

## 2024-10-21 ENCOUNTER — Encounter: Payer: Self-pay | Admitting: Physician Assistant

## 2024-10-21 VITALS — BP 138/86 | HR 99 | Ht 61.0 in | Wt 194.0 lb

## 2024-10-21 DIAGNOSIS — J208 Acute bronchitis due to other specified organisms: Secondary | ICD-10-CM

## 2024-10-21 DIAGNOSIS — R030 Elevated blood-pressure reading, without diagnosis of hypertension: Secondary | ICD-10-CM

## 2024-10-21 DIAGNOSIS — F339 Major depressive disorder, recurrent, unspecified: Secondary | ICD-10-CM

## 2024-10-21 DIAGNOSIS — B9689 Other specified bacterial agents as the cause of diseases classified elsewhere: Secondary | ICD-10-CM

## 2024-10-21 DIAGNOSIS — Z6836 Body mass index (BMI) 36.0-36.9, adult: Secondary | ICD-10-CM

## 2024-10-21 DIAGNOSIS — E66812 Obesity, class 2: Secondary | ICD-10-CM

## 2024-10-21 DIAGNOSIS — E6609 Other obesity due to excess calories: Secondary | ICD-10-CM

## 2024-10-21 MED ORDER — ZEPBOUND 2.5 MG/0.5ML ~~LOC~~ SOAJ: 2.5000 mg | SUBCUTANEOUS | 0 refills | Status: AC

## 2024-10-21 MED ORDER — AZITHROMYCIN 250 MG PO TABS: ORAL_TABLET | ORAL | 0 refills | Status: AC

## 2024-10-21 MED ORDER — VENLAFAXINE HCL ER 150 MG PO CP24: 150.0000 mg | ORAL_CAPSULE | Freq: Every day | ORAL | 1 refills | Status: AC

## 2024-10-21 MED ORDER — ZEPBOUND 5 MG/0.5ML ~~LOC~~ SOAJ: 5.0000 mg | SUBCUTANEOUS | 1 refills | Status: AC

## 2024-10-21 MED ORDER — ALBUTEROL SULFATE HFA 108 (90 BASE) MCG/ACT IN AERS: 2.0000 | INHALATION_SPRAY | Freq: Four times a day (QID) | RESPIRATORY_TRACT | 0 refills | Status: AC | PRN

## 2024-10-21 NOTE — Progress Notes (Signed)
 New Patient Office Visit  Subjective    Patient ID: Cathy [name], female    DOB: 1966-10-27  Age: 58 y.o. MRN: [id number]  CC:  Chief Complaint  Patient presents with   Transitions Of Care    HPI [name] presents to establish care.   SABRA.Discussed the use of AI scribe software for clinical note transcription with the patient, who gave verbal consent to proceed.  History of Present Illness Cathy [name] is a 58 year old female with chronic bronchitis who presents with persistent cough and wheezing.  Respiratory symptoms - Persistent cough and wheezing with mucus production - Completed a course of prednisone  - Did not receive prescribed azithromycin  due to pharmacy error - Using hydrocodone  benzoate syrup for cough management - No history of asthma  Mood disturbance - History of depression - Out of Effexor  for several weeks - Increased stress at work due to additional responsibilities after colleague's resignation  Hematologic abnormalities - History of anemia - Recent iron infusion - Continued malaise despite treatment - Hemoglobin at 8.3 - Low ferritin levels - B12 levels improved with supplementation  Metabolic findings - History of elevated cholesterol and blood sugar levels - Managing cholesterol and blood sugar through diet - No current cholesterol medication  Weight management and nutritional intake - History of weight fluctuation and yo-yo dieting - Recent liposuction - Poor appetite and frequent meal skipping     Outpatient Encounter Medications as of 10/21/2024  Medication Sig   albuterol (VENTOLIN HFA) 108 (90 Base) MCG/ACT inhaler Inhale 2 puffs into the lungs every 6 (six) hours as needed.   tirzepatide (ZEPBOUND) 2.5 MG/0.5ML Pen Inject 2.5 mg into the skin once a week.   [START ON 11/20/2024] tirzepatide (ZEPBOUND) 5 MG/0.5ML Pen Inject 5 mg into the skin once a week.   AMBULATORY NON FORMULARY MEDICATION Medication Name: nitroglycerin  0.125% gel  You should apply a pea size amount to your rectum three times daily x 6-8 weeks.   azithromycin  (ZITHROMAX  Z-PAK) 250 MG tablet Take 2 tablets (500 mg) on  Day 1,  followed by 1 tablet (250 mg) once daily on Days 2 through 5.   benzonatate (TESSALON) 200 MG capsule Take 1 capsule (200 mg total) by mouth 3 (three) times daily as needed.   chlorpheniramine-HYDROcodone  (TUSSIONEX) 10-8 MG/5ML Take 5 mLs by mouth every 12 (twelve) hours as needed.   cyanocobalamin  (VITAMIN B12) 1000 MCG/ML injection Inject 1 mL (1,000 mcg total) into the muscle every 14 (fourteen) days.   docusate sodium (COLACE) 50 MG capsule Take 50 mg by mouth daily.   hydrocortisone  (ANUSOL -HC) 2.5 % rectal cream Place 1 Application rectally 2 (two) times daily.   methylPREDNISolone  (MEDROL  DOSEPAK) 4 MG TBPK tablet Take as directed by package insert.   omeprazole  (PRILOSEC) 20 MG capsule Take 1 capsule (20 mg total) by mouth 2 (two) times daily.   SUMAtriptan  (IMITREX ) 50 MG tablet Take 1 tablet (50 mg total) by mouth every 2 (two) hours as needed for migraine. May repeat in 2 hours if headache persists or recurs.   Syringe/Needle, Disp, (SYRINGE 3CC/21GX1) 21G X 1 3 ML MISC Inject 1 mL into the muscle every 14 (fourteen) days. Pt to inject 1mL every 14 days intramuscular DX  E53.8   venlafaxine  XR (EFFEXOR  XR) 150 MG 24 hr capsule Take 1 capsule (150 mg total) by mouth daily with breakfast.   [DISCONTINUED] azithromycin  (ZITHROMAX  Z-PAK) 250 MG tablet Take 2 tablets (500 mg) on  Day 1,  followed by 1 tablet (250 mg) once daily on Days 2 through 5.   [DISCONTINUED] dicyclomine  (BENTYL ) 10 MG capsule Take 2 capsules (20 mg total) by mouth every 6 (six) hours as needed for spasms.   [DISCONTINUED] venlafaxine  XR (EFFEXOR  XR) 150 MG 24 hr capsule Take 1 capsule (150 mg total) by mouth daily with breakfast.   No facility-administered encounter medications on file as of 10/21/2024.    Past Medical History:  Diagnosis  Date   Allergy    Seasonal   Anemia 07/22/2024   Bandemia 02/13/2023   Clotting disorder 2024   Hemorrhoids   Colon polyp    Depression 2006   Start with Postpardium   Dysphagia 06/29/2022   Dyspnea on exertion    With atypical chest pain, followed by cardiology.   Genital warts 06/2016   TCA application by GYn   History of frequent urinary tract infections    Ileitis    Juvenile epilepsy (HCC)    Migraines    Rectal bleeding 07/30/2023   Seizures (HCC) 1975   none since 1987- classified as epileptic seizures   Tendonitis of ankle or foot 05/2022   right   Thrombosed external hemorrhoid     Past Surgical History:  Procedure Laterality Date   ABDOMINAL HYSTERECTOMY  11/2024   Partial   BILATERAL SALPINGECTOMY Bilateral 12/20/2014   Procedure: BILATERAL SALPINGECTOMY;  Surgeon: Nathanel LELON Bunker, MD;  Location: [facility];  Service: Gynecology;  Laterality: Bilateral;   COLONOSCOPY  03/2022   endocolon   COSMETIC SURGERY  04/2024   Lower Laser Lyposuction   HEMORRHOID SURGERY     thrombosed hemorrhoid   LAPAROSCOPIC ASSISTED VAGINAL HYSTERECTOMY N/A 12/20/2014   Procedure: LAPAROSCOPIC ASSISTED VAGINAL HYSTERECTOMY;  Surgeon: Nathanel LELON Bunker, MD;  Location: [facility];  Service: Gynecology;  Laterality: N/A;   tendon repair Right 12/24/2009   right ankle   TONSILLECTOMY  12/25/1971   UPPER GASTROINTESTINAL ENDOSCOPY      Family History  Problem Relation Age of Onset   Hyperlipidemia Mother    Heart disease Mother    Hypertension Mother    Ovarian cancer Mother    Cervical cancer Mother    Heart attack Mother    Hearing loss Mother    Varicose Veins Mother    Depression Sister    Asthma Sister    Anxiety disorder Sister    Obesity Sister    Dementia Maternal Grandmother    Heart attack Maternal Grandmother    Varicose Veins Maternal Grandmother    Heart attack Maternal Grandfather    Heart attack Paternal Grandmother    Asthma Son    Colon cancer Neg Hx     Pancreatic cancer Neg Hx    Liver cancer Neg Hx    Esophageal cancer Neg Hx     Social History   Socioeconomic History   Marital status: Divorced    Spouse name: Not on file   Number of children: 3   Years of education: 16   Highest education level: Bachelor's degree (e.g., BA, AB, BS)  Occupational History   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 less than monthly   Drug use: Never   Sexual activity: Yes    Partners: Male    Birth control/protection: Post-menopausal, Surgical    Comment: partial hysterectemy  Other Topics Concern   Not on file  Social History Narrative   Divorced. 3 children. Bachelors degree. Works as an airline pilot.   Exercises routinely.   Wears her seatbelt. Wears a bicycle helmet. Smoke detector in the home.   Feels safe in her relationships.   Social Drivers of Corporate Investment Banker Strain: Low Risk  (10/12/2024)   Overall Financial Resource Strain (CARDIA)    Difficulty of Paying Living Expenses: Not hard at all  Food Insecurity: No Food Insecurity (10/12/2024)   Hunger Vital Sign    Worried About Running Out of Food in the Last Year: Never true    Ran Out of Food in the Last Year: Never true  Transportation Needs: No Transportation Needs (10/12/2024)   PRAPARE - Administrator, Civil Service (Medical): No    Lack of Transportation (Non-Medical): No  Physical Activity: Insufficiently Active (10/12/2024)   Exercise Vital Sign    Days of Exercise per Week: 2 days    Minutes of Exercise per Session: 30 min  Stress: Stress Concern Present (10/12/2024)   [employer] of Occupational Health - Occupational Stress  Questionnaire    Feeling of Stress: To some extent  Social Connections: Socially Isolated (10/12/2024)   Social Connection and Isolation Panel    Frequency of Communication with Friends and Family: Twice a week    Frequency of Social Gatherings with Friends and Family: Once a week    Attends Religious Services: Never    Database Administrator or Organizations: No    Attends Engineer, Structural: Not on file    Marital Status: Divorced  Intimate Partner Violence: Not At Risk (08/26/2024)   Humiliation, Afraid, Rape, and Kick questionnaire    Fear of Current or Ex-Partner: No    Emotionally Abused: No    Physically Abused: No    Sexually Abused: No    ROS See HPI.    Objective    BP (!) 147/99   Pulse 99   Ht 5' 1 (1.549 m)   Wt 194 lb (88 kg)   LMP 11/19/2014   SpO2 97%   BMI 36.66 kg/m   Physical Exam Constitutional:      Appearance: Normal appearance. She is obese.  HENT:     Head: Normocephalic.     Right Ear: Tympanic membrane normal.     Left Ear: Tympanic membrane normal.     Nose: Nose normal.     Mouth/Throat:     Mouth: Mucous membranes are moist.  Eyes:     Conjunctiva/sclera: Conjunctivae normal.  Cardiovascular:     Rate and Rhythm: Normal rate and regular rhythm.  Pulmonary:     Effort: Pulmonary effort is normal.     Breath sounds: Normal breath sounds.  Musculoskeletal:     Cervical back: Normal range of motion and neck supple. No tenderness.     Right lower leg: No edema.     Left lower leg: No edema.  Lymphadenopathy:     Cervical: Cervical adenopathy present.  Neurological:     General: No focal deficit present.     Mental Status: She is alert and oriented to person, place, and time.  Psychiatric:        Mood and Affect: Mood normal.  Assessment & Plan:  SABRASABRAMahia was seen today for transitions of care.  Diagnoses and all orders for this visit:  Acute bacterial bronchitis -     azithromycin  (ZITHROMAX  Z-PAK) 250 MG  tablet; Take 2 tablets (500 mg) on  Day 1,  followed by 1 tablet (250 mg) once daily on Days 2 through 5. -     albuterol (VENTOLIN HFA) 108 (90 Base) MCG/ACT inhaler; Inhale 2 puffs into the lungs every 6 (six) hours as needed.  Class 2 obesity due to excess calories without serious comorbidity with body mass index (BMI) of 36.0 to 36.9 in adult -     tirzepatide (ZEPBOUND) 2.5 MG/0.5ML Pen; Inject 2.5 mg into the skin once a week. -     tirzepatide (ZEPBOUND) 5 MG/0.5ML Pen; Inject 5 mg into the skin once a week.  Depression, recurrent -     venlafaxine  XR (EFFEXOR  XR) 150 MG 24 hr capsule; Take 1 capsule (150 mg total) by mouth daily with breakfast.  Elevated blood pressure reading   Assessment & Plan Acute bronchitis Persistent cough and wheezing likely bacterial due to prolonged symptoms. Risk of progression to pneumonia if untreated. - Prescribed azithromycin  for bacterial bronchitis. - Continue cough syrup for symptomatic relief. - Prescribed albuterol inhaler to relieve bronchospasm. - elevated BP likely due to illness  Major depressive disorder Chronic depression exacerbated by stress and lack of medication. Reports feeling overwhelmed at home. - Refilled Effexor  prescription.  Iron deficiency anemia Secondary to inadequate dietary intake. Recent iron infusion administered. Hemoglobin at 8.3, indicating ongoing anemia. Ferritin levels low, indicating poor iron absorption. - Continue monitoring hemoglobin and ferritin levels. - Continue iron supplementation and infusions as per hematologist's guidance.  Vitamin B12 deficiency Managed with supplementation. Levels improving but not yet optimal. - Continue B12 supplementation. - Discussed with pharmacy about obtaining micro needles for B12 injections.  Obesity Management discussed. Previous liposuction attempted. Current lack of appetite and cravings noted. Discussed potential use of GLP-1 agonists for weight loss, but  insurance coverage uncertain. - Will check insurance coverage for GLP-1 agonists (Zepbound). - Will consider starting low-dose GLP-1 agonist if covered. - Will discuss alternative options such as Contrave or Healthy Weight and Wellness program if GLP-1 agonists are not covered.  Hyperlipidemia Borderline high cholesterol levels. Discussed lifestyle modifications and potential future medication options. - Continue lifestyle modifications to manage cholesterol levels.    Return in about 3 months (around 01/21/2025).   Zong Mcquarrie, PA-C

## 2024-10-21 NOTE — Patient Instructions (Signed)

## 2024-10-26 ENCOUNTER — Encounter: Payer: Self-pay | Admitting: Physician Assistant

## 2024-11-24 ENCOUNTER — Other Ambulatory Visit: Payer: Self-pay

## 2024-11-24 DIAGNOSIS — E538 Deficiency of other specified B group vitamins: Secondary | ICD-10-CM

## 2024-11-24 DIAGNOSIS — D509 Iron deficiency anemia, unspecified: Secondary | ICD-10-CM

## 2024-11-25 ENCOUNTER — Inpatient Hospital Stay: Admitting: Hematology

## 2024-11-25 ENCOUNTER — Inpatient Hospital Stay: Attending: Hematology

## 2024-11-25 VITALS — BP 127/63 | HR 76 | Temp 97.3°F | Resp 20 | Wt 200.4 lb

## 2024-11-25 DIAGNOSIS — E538 Deficiency of other specified B group vitamins: Secondary | ICD-10-CM | POA: Diagnosis not present

## 2024-11-25 DIAGNOSIS — K219 Gastro-esophageal reflux disease without esophagitis: Secondary | ICD-10-CM | POA: Insufficient documentation

## 2024-11-25 DIAGNOSIS — Z8041 Family history of malignant neoplasm of ovary: Secondary | ICD-10-CM | POA: Insufficient documentation

## 2024-11-25 DIAGNOSIS — Z8049 Family history of malignant neoplasm of other genital organs: Secondary | ICD-10-CM | POA: Diagnosis not present

## 2024-11-25 DIAGNOSIS — D509 Iron deficiency anemia, unspecified: Secondary | ICD-10-CM

## 2024-11-25 LAB — FERRITIN: Ferritin: 11 ng/mL (ref 11–307)

## 2024-11-25 LAB — CBC WITH DIFFERENTIAL (CANCER CENTER ONLY)
Abs Immature Granulocytes: 0.03 K/uL (ref 0.00–0.07)
Basophils Absolute: 0 K/uL (ref 0.0–0.1)
Basophils Relative: 1 %
Eosinophils Absolute: 0.1 K/uL (ref 0.0–0.5)
Eosinophils Relative: 1 %
HCT: 33.7 % — ABNORMAL LOW (ref 36.0–46.0)
Hemoglobin: 10.9 g/dL — ABNORMAL LOW (ref 12.0–15.0)
Immature Granulocytes: 0 %
Lymphocytes Relative: 28 %
Lymphs Abs: 2.1 K/uL (ref 0.7–4.0)
MCH: 27.5 pg (ref 26.0–34.0)
MCHC: 32.3 g/dL (ref 30.0–36.0)
MCV: 85.1 fL (ref 80.0–100.0)
Monocytes Absolute: 0.5 K/uL (ref 0.1–1.0)
Monocytes Relative: 7 %
Neutro Abs: 4.8 K/uL (ref 1.7–7.7)
Neutrophils Relative %: 63 %
Platelet Count: 316 K/uL (ref 150–400)
RBC: 3.96 MIL/uL (ref 3.87–5.11)
RDW: 18.6 % — ABNORMAL HIGH (ref 11.5–15.5)
WBC Count: 7.5 K/uL (ref 4.0–10.5)
nRBC: 0 % (ref 0.0–0.2)

## 2024-11-25 LAB — CMP (CANCER CENTER ONLY)
ALT: 12 U/L (ref 0–44)
AST: 15 U/L (ref 15–41)
Albumin: 4.3 g/dL (ref 3.5–5.0)
Alkaline Phosphatase: 91 U/L (ref 38–126)
Anion gap: 9 (ref 5–15)
BUN: 15 mg/dL (ref 6–20)
CO2: 26 mmol/L (ref 22–32)
Calcium: 9 mg/dL (ref 8.9–10.3)
Chloride: 104 mmol/L (ref 98–111)
Creatinine: 0.82 mg/dL (ref 0.44–1.00)
GFR, Estimated: >60 mL/min (ref >60)
Glucose, Bld: 99 mg/dL (ref 70–99)
Potassium: 4.6 mmol/L (ref 3.5–5.1)
Sodium: 139 mmol/L (ref 135–145)
Total Bilirubin: <0.2 mg/dL (ref 0.0–1.2)
Total Protein: 7 g/dL (ref 6.5–8.1)

## 2024-11-25 LAB — IRON AND IRON BINDING CAPACITY (CC-WL,HP ONLY)
Iron: 17 ug/dL — ABNORMAL LOW (ref 28–170)
Saturation Ratios: 4 % — ABNORMAL LOW (ref 10.4–31.8)
TIBC: 455 ug/dL — ABNORMAL HIGH (ref 250–450)
UIBC: 438 ug/dL

## 2024-11-25 LAB — VITAMIN B12: Vitamin B-12: 385 pg/mL (ref 180–914)

## 2024-11-25 NOTE — Progress Notes (Signed)
 HEMATOLOGY ONCOLOGY PROGRESS NOTE  Date of service: 11/25/2024  Patient Care Team: Breeback, Jade L, PA-C as PCP - General (Family Medicine) Court Dorn PARAS, MD as Consulting Physician (Cardiology) Estelle Service, MD as Consulting Physician (Obstetrics and Gynecology) Stacia Glendia BRAVO, MD as Consulting Physician (Gastroenterology)  CHIEF COMPLAINT/PURPOSE OF CONSULTATION: Follow-up for continued evaluation and management of Iron Deficiency Anemia  HISTORY OF PRESENTING ILLNESS: (08/26/2024) Cathy [name] is a wonderful 58 y.o. female who has been referred to us  by Dr Estelle Service, MD  for evaluation and management of Anemia.   Patient has a history of seizures, migraines, frequent urinary tract infections, depression who has been referred to us  for evaluation and management of anemia. She notes she has a history of B12 deficiency and has been on self-administered B12 injections every 2 weeks.  She has been on long term omeprazole  use for reflux. Does note some issues with hemorrhoidal bleeding. Status post laparoscopic vaginal hysterectomy on 12/20/2014. She has had a GI workup with Dr. Glendia Stacia in 2023.  EGD in July 2023 showed possible hiatal hernia with reflux esophagitis.  Colonoscopy done April 2023.  Noted to have a few small polyps that were removed.  CT done around that time had shown some ileitis which was thought to be infectious and not related to Crohn's. Patient notes no melena or significant GI bleeding at this time.   She notes fatigue but no lightheadedness or dizziness or chest pain.  INTERVAL HISTORY:  Cathy [name] is a 58 y.o. female who is here today for continued evaluation and management of Iron Deficiency Anemia.   she was last seen by me on 08/26/2024.  Today, she says that she feels improved after receiving IV iron. Endorses persistent significant bright red blood and some clotted blood in her stools - mixed in and associated with stools.  Bleeding reportedly occurs with every bowel movement, which is about every other day and sometimes the day following a bowel movement. She does have an appointment with GI and is scheduled for a colonoscopy in March 2026. Additionally, has established with a surgeon regarding hemorrhoidal banding.   REVIEW OF SYSTEMS:   10 Point review of systems of done and is negative except as noted above.  MEDICAL HISTORY Past Medical History:  Diagnosis Date   Allergy    Seasonal   Anemia 07/22/2024   Bandemia 02/13/2023   Clotting disorder 2024   Hemorrhoids   Colon polyp    Depression 2006   Start with Postpardium   Dysphagia 06/29/2022   Dyspnea on exertion    With atypical chest pain, followed by cardiology.   Genital warts 06/2016   TCA application by GYn   History of frequent urinary tract infections    Ileitis    Juvenile epilepsy (HCC)    Migraines    Rectal bleeding 07/30/2023   Seizures (HCC) 1975   none since 1987- classified as epileptic seizures   Tendonitis of ankle or foot 05/2022   right   Thrombosed external hemorrhoid     SURGICAL HISTORY Past Surgical History:  Procedure Laterality Date   ABDOMINAL HYSTERECTOMY  11/2024   Partial   BILATERAL SALPINGECTOMY Bilateral 12/20/2014   Procedure: BILATERAL SALPINGECTOMY;  Surgeon: Service LELON Estelle, MD;  Location: [facility];  Service: Gynecology;  Laterality: Bilateral;   COLONOSCOPY  03/2022   endocolon   COSMETIC SURGERY  04/2024   Lower Laser Lyposuction   HEMORRHOID SURGERY     thrombosed hemorrhoid  LAPAROSCOPIC ASSISTED VAGINAL HYSTERECTOMY N/A 12/20/2014   Procedure: LAPAROSCOPIC ASSISTED VAGINAL HYSTERECTOMY;  Surgeon: Nathanel LELON Bunker, MD;  Location: [facility];  Service: Gynecology;  Laterality: N/A;   tendon repair Right 12/24/2009   right ankle   TONSILLECTOMY  12/25/1971   UPPER GASTROINTESTINAL ENDOSCOPY      SOCIAL HISTORY Social History   Tobacco Use   Smoking status: Never   Smokeless tobacco:  Never  Vaping Use   Vaping status: Never Used  Substance Use Topics   Alcohol use: Yes    Alcohol/week: 1.0 standard drink of alcohol    Types: 1 Standard drinks or equivalent per week    Comment: socially, rarely. less than monthly   Drug use: Never    Social History   Social History Narrative   Divorced. 3 children. Bachelors degree. Works as an airline pilot.   Exercises routinely.   Wears her seatbelt. Wears a bicycle helmet. Smoke detector in the home.   Feels safe in her relationships.    SOCIAL DRIVERS OF HEALTH SDOH Screenings   Food Insecurity: No Food Insecurity (10/12/2024)  Housing: Low Risk  (10/12/2024)  Transportation Needs: No Transportation Needs (10/12/2024)  Utilities: Not At Risk (08/26/2024)  Alcohol Screen: Low Risk  (10/12/2024)  Depression (PHQ2-9): High Risk (10/21/2024)  Financial Resource Strain: Low Risk  (10/12/2024)  Physical Activity: Insufficiently Active (10/12/2024)  Social Connections: Socially Isolated (10/12/2024)  Stress: Stress Concern Present (10/12/2024)  Tobacco Use: Low Risk  (10/26/2024)     FAMILY HISTORY Family History  Problem Relation Age of Onset   Hyperlipidemia Mother    Heart disease Mother    Hypertension Mother    Ovarian cancer Mother    Cervical cancer Mother    Heart attack Mother    Hearing loss Mother    Varicose Veins Mother    Depression Sister    Asthma Sister    Anxiety disorder Sister    Obesity Sister    Dementia Maternal Grandmother    Heart attack Maternal Grandmother    Varicose Veins Maternal Grandmother    Heart attack Maternal Grandfather    Heart attack Paternal Grandmother    Asthma Son    Colon cancer Neg Hx    Pancreatic cancer Neg Hx    Liver cancer Neg Hx    Esophageal cancer Neg Hx      ALLERGIES: is allergic to macrobid [nitrofurantoin monohyd macro] and nitrofurantoin macrocrystal.  MEDICATIONS  Current Outpatient Medications  Medication Sig Dispense Refill   albuterol   (VENTOLIN  HFA) 108 (90 Base) MCG/ACT inhaler Inhale 2 puffs into the lungs every 6 (six) hours as needed. 6.7 g 0   AMBULATORY NON FORMULARY MEDICATION Medication Name: nitroglycerin 0.125% gel  You should apply a pea size amount to your rectum three times daily x 6-8 weeks. 30 g 0   azithromycin  (ZITHROMAX  Z-PAK) 250 MG tablet Take 2 tablets (500 mg) on  Day 1,  followed by 1 tablet (250 mg) once daily on Days 2 through 5. 6 tablet 0   benzonatate  (TESSALON ) 200 MG capsule Take 1 capsule (200 mg total) by mouth 3 (three) times daily as needed. 30 capsule 0   chlorpheniramine-HYDROcodone  (TUSSIONEX) 10-8 MG/5ML Take 5 mLs by mouth every 12 (twelve) hours as needed. 70 mL 0   cyanocobalamin  (VITAMIN B12) 1000 MCG/ML injection Inject 1 mL (1,000 mcg total) into the muscle every 14 (fourteen) days. 10 mL 3   docusate sodium (COLACE) 50 MG capsule Take 50 mg by mouth daily.  hydrocortisone  (ANUSOL -HC) 2.5 % rectal cream Place 1 Application rectally 2 (two) times daily. 30 g 1   methylPREDNISolone  (MEDROL  DOSEPAK) 4 MG TBPK tablet Take as directed by package insert. 21 tablet 0   omeprazole  (PRILOSEC) 20 MG capsule Take 1 capsule (20 mg total) by mouth 2 (two) times daily. 180 capsule 3   SUMAtriptan  (IMITREX ) 50 MG tablet Take 1 tablet (50 mg total) by mouth every 2 (two) hours as needed for migraine. May repeat in 2 hours if headache persists or recurs. 10 tablet 11   Syringe/Needle, Disp, (SYRINGE 3CC/21GX1) 21G X 1 3 ML MISC Inject 1 mL into the muscle every 14 (fourteen) days. Pt to inject 1mL every 14 days intramuscular DX  E53.8 48 each 0   tirzepatide  (ZEPBOUND ) 2.5 MG/0.5ML Pen Inject 2.5 mg into the skin once a week. 2 mL 0   tirzepatide  (ZEPBOUND ) 5 MG/0.5ML Pen Inject 5 mg into the skin once a week. 2 mL 1   venlafaxine  XR (EFFEXOR  XR) 150 MG 24 hr capsule Take 1 capsule (150 mg total) by mouth daily with breakfast. 90 capsule 1   No current facility-administered medications for this  visit.    PHYSICAL EXAMINATION: ECOG PERFORMANCE STATUS: 1 - Symptomatic but completely ambulatory VITALS: Vitals:   11/25/24 1215  BP: 127/63  Pulse: 76  Resp: 20  Temp: (!) 97.3 F (36.3 C)  SpO2: 98%   Filed Weights   11/25/24 1215  Weight: 200 lb 6.4 oz (90.9 kg)   Body mass index is 37.87 kg/m.  GENERAL: alert, in no acute distress and comfortable SKIN: no acute rashes, no significant lesions EYES: conjunctiva are pink and non-injected, sclera anicteric OROPHARYNX: MMM, no exudates, no oropharyngeal erythema or ulceration NECK: supple, no JVD LYMPH:  no palpable lymphadenopathy in the cervical, axillary or inguinal regions LUNGS: clear to auscultation b/l with normal respiratory effort HEART: regular rate & rhythm ABDOMEN:  normoactive bowel sounds , non tender, not distended, no hepatosplenomegaly Extremity: no pedal edema PSYCH: alert & oriented x 3 with fluent speech NEURO: no focal motor/sensory deficits  LABORATORY DATA:   I have reviewed the data as listed     Latest Ref Rng & Units 11/25/2024   11:25 AM 08/26/2024   12:01 PM 02/27/2024    7:59 AM  CBC EXTENDED  WBC 4.0 - 10.5 K/uL 7.5  7.8  6.1   RBC 3.87 - 5.11 MIL/uL 3.96  3.86  4.21   Hemoglobin 12.0 - 15.0 g/dL 89.0  8.3  87.9   HCT 63.9 - 46.0 % 33.7  27.4  36.9   Platelets 150 - 400 K/uL 316  364  325.0   NEUT# 1.7 - 7.7 K/uL 4.8  5.0    Lymph# 0.7 - 4.0 K/uL 2.1  2.2     Iron Studies:  Lab Results  Component Value Date   IRON 17 (L) 11/25/2024   UIBC 438 11/25/2024   TIBC 455 (H) 11/25/2024   IRONPCTSAT 4 (L) 11/25/2024   FERRITIN 11 11/25/2024    Vitamin B12:  Lab Results  Component Value Date   VITAMINB12 385 11/25/2024        Latest Ref Rng & Units 11/25/2024   11:25 AM 08/26/2024   12:01 PM 02/27/2024    7:59 AM  CMP  Glucose 70 - 99 mg/dL 99  898  887   BUN 6 - 20 mg/dL 15  17  12    Creatinine 0.44 - 1.00 mg/dL 9.17  9.27  0.77   Sodium 135 - 145 mmol/L 139  138  139    Potassium 3.5 - 5.1 mmol/L 4.6  3.7  4.7   Chloride 98 - 111 mmol/L 104  103  103   CO2 22 - 32 mmol/L 26  28  29    Calcium 8.9 - 10.3 mg/dL 9.0  8.9  9.4   Total Protein 6.5 - 8.1 g/dL 7.0  7.3  6.7   Total Bilirubin 0.0 - 1.2 mg/dL <9.7  0.2  0.3   Alkaline Phos 38 - 126 U/L 91  90  86   AST 15 - 41 U/L 15  9  12    ALT 0 - 44 U/L 12  8  15     08/26/2024  Iron     28 - 170 ug/dL 13 (L)   TIBC     749 - 450 ug/dL 450 (H)   Saturation Ratios     10.4 - 31.8 % 2 (L)   UIBC     148 - 442 ug/dL 463 (H)   Ferritin     11 - 307 ng/mL 7 (L)   Vitamin B12     180 - 914 pg/mL 463   Intrinsic Factor     0.0 - 1.1 AU/mL 1.1   TSH     0.350 - 4.500 uIU/mL 3.460   Parietal Cell Antibody-IgG     0.0 - 20.0 Units 4.6     RADIOGRAPHIC STUDIES: I have personally reviewed the radiological images as listed and agreed with the findings in the report. No results found.  ASSESSMENT & PLAN:  58 y.o. female with  1) Significant iron deficiency anemia hemoglobin of 8.3. Normal WBC counts and platelets. Lab Results  Component Value Date   IRON 17 (L) 11/25/2024   UIBC 438 11/25/2024   TIBC 455 (H) 11/25/2024   IRONPCTSAT 4 (L) 11/25/2024   FERRITIN 11 11/25/2024   PLAN: - Discussed lab results on 11/25/2024 in detail with patient: CBC showed WBC of 7.5K, Hemoglobin of 10.9 increased from 8.3, and PLTs of 316K. CMP normal. Reviewed independently Iron Panel and Ferritin: Iron% 4 and Ferritin 11 Iron level sub-optimal and with regards to her continuous GI bleeding, will schedule her for additional V Iron. She tolerated her previous dose of IV monoferric  well o n 09/04/2024 Suggest following-up with GI and surgeon regarding hemorrhoidal banding as this will significantly reduce her iron losses, which are seemingly fairly notably. Vitamin B12: 385 Recommend switching to subcutaneous Vitamin-B12 1000 mcg.   FOLLOW-UP  Additional dose of IV Monoferric  1000mg  at [facility] infusion center Phone  visit with Dr Onesimo in 6 months Labs 1-2 days prior to phone visit  The total time spent in the appointment was 23 minutes* .  All of the patient's questions were answered and the patient knows to call the clinic with any problems, questions, or concerns.  Emaline Onesimo MD MS AAHIVMS [facility] Hematology/Oncology Physician [facility]  *Total Encounter Time as defined by the Centers for Medicare and Medicaid Services includes, in addition to the face-to-face time of a patient visit (documented in the note above) non-face-to-face time: obtaining and reviewing outside history, ordering and reviewing medications, tests or procedures, care coordination (communications with other health care professionals or caregivers) and documentation in the medical record.  I,Emily Lagle,acting as a neurosurgeon for Emaline Onesimo, MD.,have documented all relevant documentation on the behalf of Emaline Onesimo, MD,as directed by  Emaline Onesimo, MD while in  the presence of Emaline Saran, MD.  I have reviewed the above documentation for accuracy and completeness, and I agree with the above.  Rexine Gowens, MD

## 2024-11-26 ENCOUNTER — Telehealth: Payer: Self-pay | Admitting: Gastroenterology

## 2024-11-26 NOTE — Telephone Encounter (Signed)
 Inbound call from patient stating that she got a referral from us  to go to a trauma surgeon but that her insurance is not in network with [facility] and was hoping to see if we can refer her to someone is in network. Patient is requesting a call back. Please advise.

## 2024-11-26 NOTE — Telephone Encounter (Signed)
 [place]- Please advise... Can we refer patient to [facility] for continued surgical care [id number] due to insurance constraints for current [facility] surgeon?

## 2024-11-26 NOTE — Telephone Encounter (Signed)
 Patient states that she has been recommended to move forward with hemorrhoidal surgery by her hematologist due to continued blood loss and IDA. Unfortunately, [facility] is not in network with her insurance. She states that she prefers to stay with [facility] as she really liked the physician she saw. However, this is not an option.   Advised patient she should contact her insurance company to find out what orthoptist they would approve as in network and then reach back out to us  so we can refer as appropriate. She verbalizes understanding of this information.

## 2024-11-26 NOTE — Telephone Encounter (Signed)
 Spoke w pt about referral. States she would like to be referred to [facility]. Please advise.

## 2024-11-30 NOTE — Telephone Encounter (Signed)
 36 pages of records/referral information faxed to [facility] in [place] at fax [phone].

## 2024-12-01 ENCOUNTER — Encounter: Payer: Self-pay | Admitting: Hematology

## 2024-12-02 ENCOUNTER — Other Ambulatory Visit (HOSPITAL_COMMUNITY): Payer: Self-pay | Admitting: Hematology

## 2024-12-10 ENCOUNTER — Inpatient Hospital Stay (HOSPITAL_COMMUNITY): Admission: RE | Admit: 2024-12-10 | Discharge: 2024-12-10 | Attending: Hematology

## 2024-12-10 VITALS — BP 118/62 | HR 86 | Temp 98.2°F | Resp 16

## 2024-12-10 DIAGNOSIS — D509 Iron deficiency anemia, unspecified: Secondary | ICD-10-CM | POA: Diagnosis not present

## 2024-12-10 MED ORDER — SODIUM CHLORIDE 0.9 % IV SOLN
1000.0000 mg | Freq: Once | INTRAVENOUS | Status: AC
  Administered 2024-12-10: 09:00:00 1000 mg via INTRAVENOUS
  Filled 2024-12-10: qty 10

## 2024-12-18 ENCOUNTER — Encounter (HOSPITAL_BASED_OUTPATIENT_CLINIC_OR_DEPARTMENT_OTHER): Payer: Self-pay

## 2024-12-18 ENCOUNTER — Emergency Department (HOSPITAL_BASED_OUTPATIENT_CLINIC_OR_DEPARTMENT_OTHER)
Admission: EM | Admit: 2024-12-18 | Discharge: 2024-12-18 | Disposition: A | Discharge status: Home / Self Care | Attending: Emergency Medicine | Admitting: Emergency Medicine

## 2024-12-18 ENCOUNTER — Other Ambulatory Visit: Payer: Self-pay

## 2024-12-18 DIAGNOSIS — H5711 Ocular pain, right eye: Secondary | ICD-10-CM | POA: Insufficient documentation

## 2024-12-18 MED ORDER — FLUORESCEIN SODIUM 1 MG OP STRP
1.0000 | ORAL_STRIP | Freq: Once | OPHTHALMIC | Status: AC
  Administered 2024-12-18: 1 via OPHTHALMIC
  Filled 2024-12-18: qty 1

## 2024-12-18 MED ORDER — TETRACAINE HCL 0.5 % OP SOLN
2.0000 [drp] | Freq: Once | OPHTHALMIC | Status: AC
  Administered 2024-12-18: 2 [drp] via OPHTHALMIC
  Filled 2024-12-18: qty 4

## 2024-12-18 NOTE — ED Notes (Signed)
 Visual acuity completed per order. Patient wears prescription glasses and says that her left eye is normally weak but that now the vision in her right eye is very blurry.

## 2024-12-18 NOTE — ED Provider Notes (Signed)
 " [facility] Provider Note   CSN: [id number] Arrival date & time: 12/18/24  1213     Patient presents with: Eye Pain   Cathy [name] is a 58 y.o. female.   Patient to ED for evaluation of sudden onset eye pain this morning. No visual change. There is pain with movement of the eye. No drainage, injury or foreign body sensation. No pain to eye surroundings and no facial swelling. 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   The history is provided by the patient. No language interpreter was used.  Eye Pain       Prior to Admission medications  Medication Sig Start Date End Date Taking? Authorizing Provider  albuterol  (VENTOLIN  HFA) 108 (90 Base) MCG/ACT inhaler Inhale 2 puffs into the lungs every 6 (six) hours as needed. 10/21/24   Breeback, Jade L, PA-C  AMBULATORY NON FORMULARY MEDICATION Medication Name: nitroglycerin 0.125% gel  You should apply a pea size amount to your rectum three times daily x 6-8 weeks. 03/27/24   McMichael, Nestor HERO, PA-C  azithromycin  (ZITHROMAX  Z-PAK) 250 MG tablet Take 2 tablets (500 mg) on  Day 1,  followed by 1 tablet (250 mg) once daily on Days 2 through 5. 10/21/24   Breeback, Jade L, PA-C  benzonatate  (TESSALON ) 200 MG capsule Take 1 capsule (200 mg total) by mouth 3 (three) times daily as needed. 10/13/24   Breeback, Jade L, PA-C  chlorpheniramine-HYDROcodone  (TUSSIONEX) 10-8 MG/5ML Take 5 mLs by mouth every 12 (twelve) hours as needed. 10/13/24   Breeback, Jade L, PA-C  cyanocobalamin  (VITAMIN B12) 1000 MCG/ML injection Inject 1 mL (1,000 mcg total) into the muscle every 14 (fourteen) days. 02/27/24   Kuneff, Renee A, DO  docusate sodium (COLACE) 50 MG capsule Take 50 mg by mouth daily.    [provider]  hydrocortisone  (ANUSOL -HC) 2.5 % rectal cream Place 1 Application rectally 2 (two) times daily. 03/27/24    McMichael, Nestor HERO, PA-C  methylPREDNISolone  (MEDROL  DOSEPAK) 4 MG TBPK tablet Take as directed by package insert. 10/13/24   Breeback, Jade L, PA-C  omeprazole  (PRILOSEC) 20 MG capsule Take 1 capsule (20 mg total) by mouth 2 (two) times daily. 01/15/24   Stacia Glendia BRAVO, MD  SUMAtriptan  (IMITREX ) 50 MG tablet Take 1 tablet (50 mg total) by mouth every 2 (two) hours as needed for migraine. May repeat in 2 hours if headache persists or recurs. 02/27/24   Kuneff, Renee A, DO  Syringe/Needle, Disp, (SYRINGE 3CC/21GX1) 21G X 1 3 ML MISC Inject 1 mL into the muscle every 14 (fourteen) days. Pt to inject 1mL every 14 days intramuscular DX  E53.8 12/14/21   Kuneff, Renee A, DO  tirzepatide  (ZEPBOUND ) 2.5 MG/0.5ML Pen Inject 2.5 mg into the skin once a week. 10/21/24   Breeback, Jade L, PA-C  tirzepatide  (ZEPBOUND ) 5 MG/0.5ML Pen Inject 5 mg into the skin once a week. 11/20/24   Breeback, Jade L, PA-C  venlafaxine  XR (EFFEXOR  XR) 150 MG 24 hr capsule Take 1 capsule (150 mg total) by mouth daily with breakfast. 10/21/24   Breeback, Jade L, PA-C    Allergies: Macrobid [nitrofurantoin monohyd macro] and Nitrofurantoin macrocrystal    Review of Systems  Eyes:  Positive for pain.    Updated Vital Signs BP 136/80   Pulse (!) 55   Temp 97.7 F (36.5 C)   Resp 18  Ht 5' 1 (1.549 m)   Wt 88.9 kg   LMP 11/19/2014   SpO2 100%   BMI 37.03 kg/m   Physical Exam Vitals and nursing note reviewed.  Eyes:     Pupils: Pupils are equal, round, and reactive to light.     Comments: Significant photosensitivity right eye, tearing without purulent drainage. PERRL. Scleral redness. There is conjunctival redness without swelling. FROM, painful. Cornea is clear. No lid swelling, redness or tenderness. No fluorescein  uptake. IOP 12, 13 of the right eye.      (all labs ordered are listed, but only abnormal results are displayed) Labs Reviewed - No data to display  EKG: None  Radiology: No results  found.   Procedures   Medications Ordered in the ED  tetracaine  (PONTOCAINE) 0.5 % ophthalmic solution 2 drop (2 drops Both Eyes Given by Other 12/18/24 1549)  fluorescein  ophthalmic strip 1 strip (1 strip Right Eye Given by Other 12/18/24 1549)  fluorescein  ophthalmic strip 1 strip (1 strip Right Eye Given by Other 12/18/24 1715)    Clinical Course as of 12/19/24 0006  Fri Dec 18, 2024  1619 Presents with sudden onset eye pain and redness this morning. No visual change, injury or FB. Eye exam concerning for uveitis vs conjunctivitis. REmote history of retinal toxoplasmosis but current symptoms are completely different.   Patient to be seen by Dr. Dreama, consult ophthalmology for recommendations. [SU]  1735 Per consultation to Dr. McCuen, the patient will go to her office tonight at 8:00 pm to be fully evaluated by ophthalmology. Appreciate her assistance in care.  [SU]    Clinical Course User Index [SU] Odell Balls, PA-C                                 Medical Decision Making Risk Prescription drug management.        Final diagnoses:  Acute right eye pain    ED Discharge Orders     None          Odell Balls, PA-C 12/19/24 0006    Dreama Longs, MD 12/19/24 1056  "

## 2024-12-18 NOTE — ED Triage Notes (Addendum)
 Pt woke up this am with piercing pain in right eye, is very sensitive to light. Hx of toxoplasmosis in the right eye. No known injury. Sclera is reddened on assessment.

## 2024-12-18 NOTE — ED Provider Notes (Incomplete)
"    Visual Acuity  Right Eye Distance: 20/70 Left Eye Distance: 20/40 Bilateral Distance: 20/50  Right Eye Near:   Left Eye Near:    Bilateral Near:     "

## 2024-12-19 DIAGNOSIS — H2000 Unspecified acute and subacute iridocyclitis: Secondary | ICD-10-CM | POA: Diagnosis not present

## 2024-12-21 DIAGNOSIS — H2000 Unspecified acute and subacute iridocyclitis: Secondary | ICD-10-CM | POA: Diagnosis not present

## 2025-01-23 ENCOUNTER — Other Ambulatory Visit: Payer: Self-pay | Admitting: Gastroenterology

## 2025-01-27 NOTE — Telephone Encounter (Signed)
 Patient did have appointment with [facility] team on 12/29/24 and has since had surgery with follow up.

## 2025-05-26 ENCOUNTER — Inpatient Hospital Stay: Attending: Hematology

## 2025-05-28 ENCOUNTER — Inpatient Hospital Stay: Admitting: Hematology
